# Patient Record
Sex: Female | Born: 1965 | Race: White | Hispanic: No | Marital: Married | Smoking: Never smoker
Health system: Southern US, Community
[De-identification: ages and names within clinical notes are randomized; demographics above are authoritative.]

## PROBLEM LIST (undated history)

## (undated) DIAGNOSIS — I4901 Ventricular fibrillation: Secondary | ICD-10-CM

## (undated) DIAGNOSIS — I4581 Long QT syndrome: Secondary | ICD-10-CM

## (undated) DIAGNOSIS — Z975 Presence of (intrauterine) contraceptive device: Secondary | ICD-10-CM

## (undated) DIAGNOSIS — I219 Acute myocardial infarction, unspecified: Secondary | ICD-10-CM

## (undated) DIAGNOSIS — Z95810 Presence of automatic (implantable) cardiac defibrillator: Secondary | ICD-10-CM

## (undated) DIAGNOSIS — Z950 Presence of cardiac pacemaker: Secondary | ICD-10-CM

## (undated) DIAGNOSIS — I4892 Unspecified atrial flutter: Secondary | ICD-10-CM

## (undated) DIAGNOSIS — R001 Bradycardia, unspecified: Secondary | ICD-10-CM

## (undated) HISTORY — PX: TYMPANOSTOMY TUBE PLACEMENT: SHX32

## (undated) HISTORY — DX: Presence of (intrauterine) contraceptive device: Z97.5

## (undated) HISTORY — PX: OTHER SURGICAL HISTORY: SHX169

## (undated) HISTORY — DX: Ventricular fibrillation: I49.01

## (undated) HISTORY — DX: Unspecified atrial flutter: I48.92

## (undated) HISTORY — PX: ADENOIDECTOMY: SUR15

## (undated) HISTORY — PX: IMPLANTABLE CARDIOVERTER DEFIBRILLATOR IMPLANT: SHX5860

## (undated) NOTE — Telephone Encounter (Signed)
 Formatting of this note might be different from the original. VIRTUAL CARE NOTE  Reason for message: Cardiology SBAR Other  Patient's request: returning call to [facility] regarding requested appointment with Dr Donnella.  Action:  Routed/pended for RN/LPN review. Called the 2 numbers on file but member unavailable to pick calls and no access to voice mail. Best Callback Number  [phone]  No future appointments.  Electronically signed by Lowry Lorenz Sober RN at 10/21/2023  8:16 AM EDT

## (undated) NOTE — Telephone Encounter (Signed)
 Formatting of this note might be different from the original. Copied from CRM #[id number]. Topic: MSG - Returning Telephone Call [phone]) >> Oct 20, 2023  4:21 PM Geannie GORMAN Kung wrote:  Recommendation: Please evaluate message and contact caller, if appropriate.  Notify caller when request is completed by Phone  Situation: Reason for call: Mackenze Grandison is a 40 female calling for Returning Telephone Call - member received message from  and is returning call. Member stated she is declining to be scheduled with cardio  Best Callback Number    The member authorizes [facility] employees to Respond by voice message  Background: Which department and/or provider would you like your request/message directed to?  cardio  No future appointments.  Action/Assessment:  Message sent to [facility]  Member expectations for follow up    Was documentation recapped and verified with caller?  Yes, Electronically signed by Lowry Lorenz Barnie RN at 10/21/2023  8:16 AM EDT

---

## 1997-08-04 HISTORY — PX: DILATION AND CURETTAGE OF UTERUS: SHX78

## 1998-06-05 ENCOUNTER — Other Ambulatory Visit: Admission: RE | Admit: 1998-06-05 | Discharge: 1998-06-05 | Payer: Self-pay | Admitting: Gynecology

## 1998-06-14 ENCOUNTER — Ambulatory Visit (HOSPITAL_COMMUNITY): Admission: RE | Admit: 1998-06-14 | Discharge: 1998-06-14 | Payer: Self-pay | Admitting: Gynecology

## 1999-01-06 ENCOUNTER — Encounter: Payer: Self-pay | Admitting: Gynecology

## 1999-01-06 ENCOUNTER — Inpatient Hospital Stay (HOSPITAL_COMMUNITY): Admission: AD | Admit: 1999-01-06 | Discharge: 1999-01-06 | Payer: Self-pay | Admitting: Gynecology

## 1999-01-15 ENCOUNTER — Other Ambulatory Visit: Admission: RE | Admit: 1999-01-15 | Discharge: 1999-01-15 | Payer: Self-pay | Admitting: Gynecology

## 1999-08-20 ENCOUNTER — Inpatient Hospital Stay (HOSPITAL_COMMUNITY): Admission: AD | Admit: 1999-08-20 | Discharge: 1999-08-23 | Payer: Self-pay | Admitting: Gynecology

## 1999-10-09 ENCOUNTER — Other Ambulatory Visit: Admission: RE | Admit: 1999-10-09 | Discharge: 1999-10-09 | Payer: Self-pay | Admitting: Gynecology

## 2000-12-07 ENCOUNTER — Other Ambulatory Visit: Admission: RE | Admit: 2000-12-07 | Discharge: 2000-12-07 | Payer: Self-pay | Admitting: Gynecology

## 2001-12-09 ENCOUNTER — Other Ambulatory Visit: Admission: RE | Admit: 2001-12-09 | Discharge: 2001-12-09 | Payer: Self-pay | Admitting: Gynecology

## 2003-10-04 ENCOUNTER — Other Ambulatory Visit: Admission: RE | Admit: 2003-10-04 | Discharge: 2003-10-04 | Payer: Self-pay | Admitting: Gynecology

## 2006-01-13 ENCOUNTER — Other Ambulatory Visit: Admission: RE | Admit: 2006-01-13 | Discharge: 2006-01-13 | Payer: Self-pay | Admitting: Gynecology

## 2006-12-12 ENCOUNTER — Emergency Department (HOSPITAL_COMMUNITY): Admission: EM | Admit: 2006-12-12 | Discharge: 2006-12-12 | Payer: Self-pay | Admitting: Emergency Medicine

## 2007-02-25 ENCOUNTER — Other Ambulatory Visit: Admission: RE | Admit: 2007-02-25 | Discharge: 2007-02-25 | Payer: Self-pay | Admitting: Gynecology

## 2008-01-03 ENCOUNTER — Inpatient Hospital Stay (HOSPITAL_COMMUNITY): Admission: EM | Admit: 2008-01-03 | Discharge: 2008-01-10 | Payer: Self-pay | Admitting: Emergency Medicine

## 2008-01-03 ENCOUNTER — Ambulatory Visit: Payer: Self-pay | Admitting: Pulmonary Disease

## 2008-01-03 ENCOUNTER — Ambulatory Visit: Payer: Self-pay | Admitting: Internal Medicine

## 2008-01-04 ENCOUNTER — Encounter (INDEPENDENT_AMBULATORY_CARE_PROVIDER_SITE_OTHER): Payer: Self-pay | Admitting: Cardiology

## 2008-01-24 ENCOUNTER — Ambulatory Visit: Payer: Self-pay

## 2008-02-17 ENCOUNTER — Encounter (HOSPITAL_COMMUNITY): Admission: RE | Admit: 2008-02-17 | Discharge: 2008-04-27 | Payer: Self-pay | Admitting: Cardiology

## 2008-04-13 ENCOUNTER — Ambulatory Visit: Payer: Self-pay | Admitting: Internal Medicine

## 2008-07-26 ENCOUNTER — Ambulatory Visit: Payer: Self-pay | Admitting: Internal Medicine

## 2008-08-21 ENCOUNTER — Ambulatory Visit: Payer: Self-pay | Admitting: Internal Medicine

## 2008-09-01 ENCOUNTER — Inpatient Hospital Stay (HOSPITAL_COMMUNITY): Admission: EM | Admit: 2008-09-01 | Discharge: 2008-09-04 | Payer: Self-pay | Admitting: Emergency Medicine

## 2008-09-01 ENCOUNTER — Ambulatory Visit: Payer: Self-pay | Admitting: Internal Medicine

## 2008-09-01 ENCOUNTER — Encounter: Payer: Self-pay | Admitting: Internal Medicine

## 2008-09-25 ENCOUNTER — Encounter: Payer: Self-pay | Admitting: Gynecology

## 2008-09-25 ENCOUNTER — Ambulatory Visit: Payer: Self-pay | Admitting: Gynecology

## 2008-09-25 ENCOUNTER — Other Ambulatory Visit: Admission: RE | Admit: 2008-09-25 | Discharge: 2008-09-25 | Payer: Self-pay | Admitting: Gynecology

## 2008-09-29 ENCOUNTER — Encounter: Payer: Self-pay | Admitting: Internal Medicine

## 2008-10-06 ENCOUNTER — Ambulatory Visit: Payer: Self-pay | Admitting: Internal Medicine

## 2008-11-30 ENCOUNTER — Encounter: Payer: Self-pay | Admitting: Internal Medicine

## 2008-12-05 ENCOUNTER — Ambulatory Visit: Payer: Self-pay | Admitting: Internal Medicine

## 2008-12-29 ENCOUNTER — Encounter: Payer: Self-pay | Admitting: Internal Medicine

## 2009-01-18 ENCOUNTER — Ambulatory Visit: Payer: Self-pay | Admitting: Internal Medicine

## 2009-01-18 DIAGNOSIS — I469 Cardiac arrest, cause unspecified: Secondary | ICD-10-CM | POA: Insufficient documentation

## 2009-01-18 DIAGNOSIS — I4581 Long QT syndrome: Secondary | ICD-10-CM | POA: Insufficient documentation

## 2009-01-22 LAB — CONVERTED CEMR LAB
Calcium: 8.6 mg/dL (ref 8.4–10.5)
Creatinine, Ser: 0.6 mg/dL (ref 0.4–1.2)
GFR calc non Af Amer: 115.82 mL/min (ref >60)
Sodium: 141 meq/L (ref 135–145)

## 2009-02-06 ENCOUNTER — Telehealth: Payer: Self-pay | Admitting: Internal Medicine

## 2009-03-02 ENCOUNTER — Ambulatory Visit: Payer: Self-pay | Admitting: Internal Medicine

## 2009-03-02 DIAGNOSIS — E876 Hypokalemia: Secondary | ICD-10-CM | POA: Insufficient documentation

## 2009-03-02 DIAGNOSIS — E162 Hypoglycemia, unspecified: Secondary | ICD-10-CM | POA: Insufficient documentation

## 2009-03-02 LAB — CONVERTED CEMR LAB
BUN: 10 mg/dL (ref 6–23)
CO2: 32 meq/L (ref 19–32)
Calcium: 9 mg/dL (ref 8.4–10.5)
Chloride: 109 meq/L (ref 96–112)
Creatinine, Ser: 0.7 mg/dL (ref 0.4–1.2)
GFR calc non Af Amer: 96.89 mL/min (ref >60)
Glucose, Bld: 70 mg/dL (ref 70–99)
Potassium: 4 meq/L (ref 3.5–5.1)
Sodium: 144 meq/L (ref 135–145)

## 2009-07-16 ENCOUNTER — Encounter: Payer: Self-pay | Admitting: Internal Medicine

## 2009-08-23 ENCOUNTER — Ambulatory Visit: Payer: Self-pay | Admitting: Internal Medicine

## 2009-08-23 DIAGNOSIS — Z95810 Presence of automatic (implantable) cardiac defibrillator: Secondary | ICD-10-CM | POA: Insufficient documentation

## 2009-08-23 DIAGNOSIS — I10 Essential (primary) hypertension: Secondary | ICD-10-CM | POA: Insufficient documentation

## 2009-10-28 ENCOUNTER — Inpatient Hospital Stay (HOSPITAL_COMMUNITY): Admission: EM | Admit: 2009-10-28 | Discharge: 2009-10-30 | Payer: Self-pay | Admitting: Emergency Medicine

## 2009-10-28 ENCOUNTER — Telehealth: Payer: Self-pay | Admitting: Adult Health

## 2009-10-28 ENCOUNTER — Ambulatory Visit: Payer: Self-pay | Admitting: Internal Medicine

## 2009-10-30 ENCOUNTER — Encounter: Payer: Self-pay | Admitting: Internal Medicine

## 2009-11-02 ENCOUNTER — Encounter (INDEPENDENT_AMBULATORY_CARE_PROVIDER_SITE_OTHER): Payer: Self-pay | Admitting: *Deleted

## 2009-11-07 ENCOUNTER — Ambulatory Visit: Payer: Self-pay

## 2009-11-07 ENCOUNTER — Ambulatory Visit: Payer: Self-pay | Admitting: Internal Medicine

## 2009-11-14 LAB — CONVERTED CEMR LAB
CO2: 29 meq/L (ref 19–32)
Calcium: 9.1 mg/dL (ref 8.4–10.5)
Chloride: 103 meq/L (ref 96–112)
Creatinine, Ser: 0.7 mg/dL (ref 0.4–1.2)
Sodium: 141 meq/L (ref 135–145)

## 2009-11-28 ENCOUNTER — Encounter: Payer: Self-pay | Admitting: Internal Medicine

## 2010-02-19 ENCOUNTER — Encounter: Payer: Self-pay | Admitting: Internal Medicine

## 2010-03-04 ENCOUNTER — Ambulatory Visit: Payer: Self-pay | Admitting: Internal Medicine

## 2010-03-11 ENCOUNTER — Encounter: Payer: Self-pay | Admitting: Internal Medicine

## 2010-05-20 ENCOUNTER — Telehealth: Payer: Self-pay | Admitting: Internal Medicine

## 2010-06-06 ENCOUNTER — Ambulatory Visit: Payer: Self-pay | Admitting: Internal Medicine

## 2010-07-04 ENCOUNTER — Encounter (INDEPENDENT_AMBULATORY_CARE_PROVIDER_SITE_OTHER): Payer: Self-pay | Admitting: *Deleted

## 2010-08-29 ENCOUNTER — Ambulatory Visit
Admission: RE | Admit: 2010-08-29 | Discharge: 2010-08-29 | Payer: Self-pay | Source: Home / Self Care | Attending: Internal Medicine | Admitting: Internal Medicine

## 2010-09-03 NOTE — Letter (Signed)
Summary: Device-Delinquent Phone Journalist, newspaper, Main Office  [address]   [place], [place] [address]   Phone: [phone]  Fax: [phone]     November 28, 2009 MRN: [id number]   [facility] [address] [place], [place]  [address]   Dear [name],  According to our records, you were scheduled for a device phone transmission on 11-20-2009.    We did not receive any results from this check.  If you transmitted on your scheduled day, please call us to help troubleshoot your system.  If you forgot to send your transmission, please send one upon receipt of this letter.  Thank you,   Architectural technologist [facility]

## 2010-09-03 NOTE — Assessment & Plan Note (Signed)
Summary: device/saf   Visit Type:  Follow-up Primary Provider:  Reynaldo Minium MD   History of Present Illness: [name] returns today for followup.  She is a pleasant 45 yo woman with a VF arrest, thought most likely to be due to long QT syndrome (QT was almost 700 with T wave alternans after her arrest) who had an ICD placed.  She returns today for followup.  The patient denies c/p, sob, or any intercurrent ICD therapies.  She has been maintained on metoprolol and after recurrent VF, Mexilitine.  She notes that she is scheduled to travel to the [place] in one month.  Current Medications (verified): 1)  Potassium Chloride Crys Cr 20 Meq Cr-Tabs (Potassium Chloride Crys Cr) .... Take One Tablet By Mouth Twice A Day 2)  Mexiletine Hcl 150 Mg Caps (Mexiletine Hcl) .... Take One Capsule By Mouth Once Daily 3)  Metoprolol Tartrate 50 Mg Tabs (Metoprolol Tartrate) .... Take 1/2 By Mouth Once Daily 4)  Nifedipine 10 Mg Caps (Nifedipine) .... Take 1 Capsule By Mouth Once A Day  Allergies (verified): No Known Drug Allergies  Past History:  Past Medical History: Last updated: 06/22/2008 Long QT syndrome VF arrest with rescucitation 01/03/08 VDRF secondary to VF arrest  Past Surgical History: Last updated: 06/22/2008 ICD implant June 2009 (Medtronic Maximo 2 VR)  Review of Systems  The patient denies chest pain, syncope, dyspnea on exertion, and peripheral edema.    Vital Signs:  Patient profile:   45 year old female Height:      64 inches Weight:      142 pounds BMI:     24.46 Pulse rate:   56 / minute BP sitting:   132 / 70  Vitals Entered By: Laurance Flatten CMA (August 23, 2009 4:40 PM)  Physical Exam  General:  Well developed, well nourished, in no acute distress. Head:  normocephalic and atraumatic Eyes:  PERRLA/EOM intact; conjunctiva and lids normal. Mouth:  Teeth, gums and palate normal. Oral mucosa normal. Neck:  Neck supple, no JVD. No masses, thyromegaly or  abnormal cervical nodes. Chest Wall:  Well healed ICD incision. Lungs:  Clear bilaterally to auscultation. No wheezes, rales, or rhonchi. Heart:  RRR with normal S1 and S2.  PMI is not enlarged or laterally displaced. Abdomen:  Bowel sounds positive; abdomen soft and non-tender without masses, organomegaly, or hernias noted. No hepatosplenomegaly. Msk:  Back normal, normal gait. Muscle strength and tone normal. Pulses:  pulses normal in all 4 extremities Extremities:  No clubbing or cyanosis. Neurologic:  Alert and oriented x 3.    ICD Specifications Following MD:  Tracy Bunting, MD     ICD Vendor:  Medtronic     ICD Model Number:  [id number]     ICD Serial Number:  [id number] H ICD DOI:  01/07/2008      Lead 1:    Location: RV     DOI: 01/07/2008     Model #: 7120     Serial #: [id number]     Status: active  Indications::  Cardiac Arrest   ICD Follow Up Remote Check?  No Battery Voltage:  3.14 V     Charge Time:  8.4 seconds     Underlying rhythm:  SR WITH PVC'S ICD Dependent:  No       ICD Device Measurements Right Ventricle:  Amplitude: 4 mV, Impedance: 532 ohms, Threshold: 1.0 V at 0.4 msec Shock Impedance: 42/56 ohms   Episodes Shock:  0  ATP:  0     Nonsustained:  0     Ventricular Pacing:  26%  Brady Parameters Mode VVI     Lower Rate Limit:  40      Tachy Zones VF:  200     Next Remote Date:  11/20/2009     Next Cardiology Appt Due:  08/05/2010 Tech Comments:  Normal device function.  No changes made today.  Pt does Carelink transmissions.  ROV 12 months GT. Gypsy Balsam RN BSN  August 23, 2009 4:44 PM  MD Comments:  Agree with above.  Impression & Recommendations:  Problem # 1:  AUTOMATIC IMPLANTABLE CARDIAC DEFIBRILLATOR SITU (ICD-V45.02) Her device is working normally.  She has had no intercurrent ICD therapies.  Will followup in several months.  Problem # 2:  LONG QT SYNDROME (ICD-426.82) She has been arrhythmia free on her meds as below. The following  medications were removed from the medication list:    Procardia 10 Mg Caps (Nifedipine) .Marland Kitchen... 1 by mouth once daily Her updated medication list for this problem includes:    Mexiletine Hcl 150 Mg Caps (Mexiletine hcl) .Marland Kitchen... Take one capsule by mouth once daily    Metoprolol Tartrate 50 Mg Tabs (Metoprolol tartrate) .Marland Kitchen... Take 1/2 by mouth once daily    Nifedipine 10 Mg Caps (Nifedipine) .Marland Kitchen... Take 1 capsule by mouth once a day  Problem # 3:  ESSENTIAL HYPERTENSION, BENIGN (ICD-401.1) Her blood pressure is minimally elevated today but is normally controlled.  She asked me about taking nifedipine.  I did not start this medication but will follow up and consider adjusting to perhaps an ARB or ACE inhibitor over time. The following medications were removed from the medication list:    Procardia 10 Mg Caps (Nifedipine) .Marland Kitchen... 1 by mouth once daily Her updated medication list for this problem includes:    Metoprolol Tartrate 50 Mg Tabs (Metoprolol tartrate) .Marland Kitchen... Take 1/2 by mouth once daily    Nifedipine 10 Mg Caps (Nifedipine) .Marland Kitchen... Take 1 capsule by mouth once a day  Patient Instructions: 1)   You are scheduled for a device check from home on November 20, 2009. You may send your transmission at any time that day. If you have a wireless device, the transmission will be sent automatically. After your physician reviews your transmission, you will receive a postcard with your next transmission date. 2)  Your physician recommends that you schedule a follow-up appointment with Dr Ladona Ridgel in 12 months

## 2010-09-03 NOTE — Letter (Signed)
Summary: Remote Device Check  [employer], Main Office  [address]   [place], [place] [address]   Phone: [phone]  Fax: [phone]     July 04, 2010 MRN: [id number]   [facility] [address] [place], [place]  [address]   Dear Ms. Westergaard,   Your remote transmission was recieved and reviewed by your physician.  All diagnostics were within normal limits for you.  _____Your next transmission is scheduled for:                       .  Please transmit at any time this day.  If you have a wireless device your transmission will be sent automatically.  ___X___Your next office visit is scheduled for: January 2012 with Dr. Ladona Ridgel.                             . Please call our office to schedule an appointment.    Sincerely,  Altha Harm, LPN

## 2010-09-03 NOTE — Progress Notes (Signed)
Summary: refill  Phone Note Refill Request Message from:  Patient on May 20, 2010 2:41 PM  Refills Requested: Medication #1:  POTASSIUM CHLORIDE CRYS CR 20 MEQ CR-TABS Take one tablet by mouth twice a day [facility] [phone]  Initial call taken by: Judie Grieve,  May 20, 2010 2:42 PM  Follow-up for Phone Call        Sent with correct dose and directions. Follow-up by: Laurance Flatten CMA,  May 22, 2010 4:16 PM

## 2010-09-03 NOTE — Letter (Signed)
Summary: Remote Device Check  [employer], Main Office  [address]   [place], [place] [address]   Phone: [phone]  Fax: [phone]     March 11, 2010 MRN: [id number]   [facility] [address] [place], [place]  [address]   Dear Ms. Amesquita,   Your remote transmission was recieved and reviewed by your physician.  All diagnostics were within normal limits for you.  __X___Your next transmission is scheduled for:  06-06-2010.  Please transmit at any time this day.  If you have a wireless device your transmission will be sent automatically.    Sincerely,  Vella Kohler

## 2010-09-03 NOTE — Letter (Signed)
Summary: Device-Delinquent Phone Journalist, newspaper, Main Office  [address]   [place], [place] [address]   Phone: [phone]  Fax: [phone]     February 19, 2010 MRN: [id number]   [facility] [address] [place], [place]  [address]   Dear Ms. Parkison,  According to our records, you were scheduled for a device phone transmission on 11-20-2009.     We did not receive any results from this check.  If you transmitted on your scheduled day, please call us to help troubleshoot your system.  If you forgot to send your transmission, please send one upon receipt of this letter.  Thank you,   Architectural technologist Device Clinic

## 2010-09-03 NOTE — Progress Notes (Signed)
  Phone Note Call from Patient Call back at [facility] Phone [phone]   Summary of Call: [name] called mildy upset because she thinks that her ICD fired today.  She says that she never felt it discharge,but did have a syncopal episode.  She says she felt bad, flushed and then decided to lay down.  She thinks she passed out.  She is requesting that her pacemaker be checked again to ascertain if it shocked her.  She states the last time her ICD fired, she did not feel it, but instead had a syncopal episode.  I have advised her to come by the office in the am 10/29/2009 to have it interrogated.  If, she experiences another syncopal episode she is to come to ER via EMS. She verbalizes understanding.  She may have fallen asleep as well and thought she passed out but she is not suret.  I have left a message with the [employer] office to expect her. Initial call taken by: Joni Reining NP

## 2010-09-03 NOTE — Cardiovascular Report (Signed)
Summary: Office Visit Remote   Office Visit Remote   Imported By: Roderic Ovens 03/11/2010 14:49:31  _____________________________________________________________________  External Attachment:    Type:   Image     Comment:   External Document

## 2010-09-03 NOTE — Miscellaneous (Signed)
Summary: [facility] s/p hospital  Clinical Lists Changes  Orders: Added new Referral order of Treadmill (Treadmill) - Signed

## 2010-09-05 NOTE — Assessment & Plan Note (Signed)
Summary: [email or web address]   Visit Type:  Follow-up Primary Provider:  Reynaldo Minium MD   History of Present Illness: [name] returns today for followup.  She is a pleasant 45 yo woman with a VF arrest, thought most likely to be due to long QT syndrome (QT was almost 700 with T wave alternans after her arrest) who had an ICD placed.  She returns today for followup.  The patient denies c/p, sob, or any intercurrent ICD therapies.  She has been maintained on metoprolol and after recurrent VF, Mexilitine.  She notes that she has begun to exercise regularly and has lost 20 lbs. She is tolerating her meds well and had no specific complaints.  Current Medications (verified): 1)  Potassium Chloride Crys Cr 20 Meq Cr-Tabs (Potassium Chloride Crys Cr) .... Take 2 Tablets By Mouth Twice A Day 2)  Mexiletine Hcl 150 Mg Caps (Mexiletine Hcl) .... Take One Capsule By Mouth Two Times A Day 3)  Metoprolol Tartrate 50 Mg Tabs (Metoprolol Tartrate) .... Take 1/2 By Mouth Once Daily  Allergies (verified): No Known Drug Allergies  Past History:  Past Medical History: Last updated: 06/22/2008 Long QT syndrome VF arrest with rescucitation 01/03/08 VDRF secondary to VF arrest  Past Surgical History: Last updated: 06/22/2008 ICD implant June 2009 (Medtronic Maximo 2 VR)  Review of Systems  The patient denies chest pain, syncope, dyspnea on exertion, and peripheral edema.    Vital Signs:  Patient profile:   45 year old female Height:      64 inches Weight:      123 pounds BMI:     21.19 Pulse rate:   53 / minute BP sitting:   110 / 70  (left arm)  Vitals Entered By: Laurance Flatten CMA (August 29, 2010 10:21 AM)  Physical Exam  General:  Well developed, well nourished, in no acute distress. Head:  normocephalic and atraumatic Eyes:  PERRLA/EOM intact; conjunctiva and lids normal. Mouth:  Teeth, gums and palate normal. Oral mucosa normal. Neck:  Neck supple, no JVD. No masses,  thyromegaly or abnormal cervical nodes. Chest Wall:  Well healed ICD incision. Lungs:  Clear bilaterally to auscultation. No wheezes, rales, or rhonchi. Heart:  RRR with normal S1 and S2.  PMI is not enlarged or laterally displaced. Abdomen:  Bowel sounds positive; abdomen soft and non-tender without masses, organomegaly, or hernias noted. No hepatosplenomegaly. Msk:  Back normal, normal gait. Muscle strength and tone normal. Pulses:  pulses normal in all 4 extremities Extremities:  No clubbing or cyanosis. Neurologic:  Alert and oriented x 3.    ICD Specifications Following MD:  Lewayne Bunting, MD     ICD Vendor:  Medtronic     ICD Model Number:  [id number]     ICD Serial Number:  [id number] H ICD DOI:  01/07/2008      Lead 1:    Location: RV     DOI: 01/07/2008     Model #: 7120     Serial #: [id number]     Status: active  Indications::  Cardiac Arrest   ICD Follow Up Battery Voltage:  3.07 V     Charge Time:  9.1 seconds     Underlying rhythm:  SR ICD Dependent:  No       ICD Device Measurements Right Ventricle:  Amplitude: 2.0 mV, Impedance: 475 ohms, Threshold: 1.00 V at 0.40 msec Shock Impedance: 42/55 ohms   Episodes MS Episodes:  0     Shock:  0  ATP:  0     Nonsustained:  0     Atrial Therapies:  0 Ventricular Pacing:  3.7%  Brady Parameters Mode VVI     Lower Rate Limit:  40      Tachy Zones VF:  200     Next Remote Date:  11/28/2010     Next Cardiology Appt Due:  08/05/2011 Tech Comments:  NORMAL DEVICE FUNCTION.  NO EPISODES SINCE LAST CHECK.  NO CHANGES MADE.  CARELINK 11-28-10 AND ROV IN 12 MTHS W/GT. Vella Kohler  August 29, 2010 10:24 AM MD Comments:  Agree with above.  Impression & Recommendations:  Problem # 1:  AUTOMATIC IMPLANTABLE CARDIAC DEFIBRILLATOR SITU (ICD-V45.02) Her device is working normaly. Will continue current meds.  Problem # 2:  LONG QT SYNDROME (ICD-426.82) She has had no recurrent ventricular arrhythmias since last visit. She will  continue her current meds. The following medications were removed from the medication list:    Nifedipine 10 Mg Caps (Nifedipine) .Marland Kitchen... Take 1 capsule by mouth once a day Her updated medication list for this problem includes:    Mexiletine Hcl 150 Mg Caps (Mexiletine hcl) .Marland Kitchen... Take one capsule by mouth two times a day    Metoprolol Tartrate 50 Mg Tabs (Metoprolol tartrate) .Marland Kitchen... Take 1/2 by mouth once daily  Problem # 3:  ESSENTIAL HYPERTENSION, BENIGN (ICD-401.1) Her blood pressure is under much better control. Will follow. The following medications were removed from the medication list:    Nifedipine 10 Mg Caps (Nifedipine) .Marland Kitchen... Take 1 capsule by mouth once a day Her updated medication list for this problem includes:    Metoprolol Tartrate 50 Mg Tabs (Metoprolol tartrate) .Marland Kitchen... Take 1/2 by mouth once daily

## 2010-10-27 LAB — BASIC METABOLIC PANEL
CO2: 25 mEq/L (ref 19–32)
Calcium: 8.8 mg/dL (ref 8.4–10.5)
Chloride: 109 mEq/L (ref 96–112)
Creatinine, Ser: 0.68 mg/dL (ref 0.4–1.2)
Creatinine, Ser: 0.73 mg/dL (ref 0.4–1.2)
GFR calc Af Amer: >60 mL/min (ref >60)
GFR calc Af Amer: >60 mL/min (ref >60)
GFR calc non Af Amer: >60 mL/min (ref >60)
Glucose, Bld: 105 mg/dL — ABNORMAL HIGH (ref 70–99)
Potassium: 3.4 mEq/L — ABNORMAL LOW (ref 3.5–5.1)
Sodium: 140 mEq/L (ref 135–145)
Sodium: 142 mEq/L (ref 135–145)

## 2010-10-27 LAB — PHOSPHORUS: Phosphorus: 2.2 mg/dL — ABNORMAL LOW (ref 2.3–4.6)

## 2010-10-27 LAB — CBC
HCT: 42.9 % (ref 36.0–46.0)
Hemoglobin: 12.9 g/dL (ref 12.0–15.0)
Hemoglobin: 14.2 g/dL (ref 12.0–15.0)
Hemoglobin: 14.6 g/dL (ref 12.0–15.0)
MCHC: 34.3 g/dL (ref 30.0–36.0)
MCV: 97.1 fL (ref 78.0–100.0)
MCV: 97.6 fL (ref 78.0–100.0)
RBC: 3.91 MIL/uL (ref 3.87–5.11)
RBC: 4.26 MIL/uL (ref 3.87–5.11)
RBC: 4.39 MIL/uL (ref 3.87–5.11)
WBC: 6.1 10*3/uL (ref 4.0–10.5)

## 2010-10-27 LAB — SEDIMENTATION RATE: Sed Rate: 5 mm/hr (ref 0–22)

## 2010-10-27 LAB — PROTIME-INR: INR: 0.99 (ref 0.00–1.49)

## 2010-10-27 LAB — TSH: TSH: 0.927 u[IU]/mL (ref 0.350–4.500)

## 2010-10-27 LAB — POCT I-STAT, CHEM 8
Creatinine, Ser: 0.8 mg/dL (ref 0.4–1.2)
HCT: 45 % (ref 36.0–46.0)
Hemoglobin: 15.3 g/dL — ABNORMAL HIGH (ref 12.0–15.0)
Potassium: 3.4 mEq/L — ABNORMAL LOW (ref 3.5–5.1)
Sodium: 142 mEq/L (ref 135–145)
TCO2: 25 mmol/L (ref 0–100)

## 2010-10-27 LAB — URINALYSIS, ROUTINE W REFLEX MICROSCOPIC
Glucose, UA: NEGATIVE mg/dL
Ketones, ur: NEGATIVE mg/dL
Leukocytes, UA: NEGATIVE
Protein, ur: NEGATIVE mg/dL
Urobilinogen, UA: 0.2 mg/dL (ref 0.0–1.0)

## 2010-10-27 LAB — DIFFERENTIAL
Basophils Absolute: 0 10*3/uL (ref 0.0–0.1)
Basophils Relative: 1 % (ref 0–1)
Eosinophils Absolute: 0.1 10*3/uL (ref 0.0–0.7)
Eosinophils Absolute: 0.1 10*3/uL (ref 0.0–0.7)
Eosinophils Relative: 1 % (ref 0–5)
Lymphocytes Relative: 33 % (ref 12–46)
Lymphs Abs: 2.3 10*3/uL (ref 0.7–4.0)
Lymphs Abs: 2.6 10*3/uL (ref 0.7–4.0)
Monocytes Absolute: 0.5 10*3/uL (ref 0.1–1.0)
Monocytes Absolute: 0.6 10*3/uL (ref 0.1–1.0)
Monocytes Relative: 8 % (ref 3–12)
Monocytes Relative: 9 % (ref 3–12)
Neutro Abs: 4.6 10*3/uL (ref 1.7–7.7)
Neutrophils Relative %: 53 % (ref 43–77)
Neutrophils Relative %: 59 % (ref 43–77)

## 2010-10-27 LAB — CK TOTAL AND CKMB (NOT AT ARMC)
CK, MB: 1.6 ng/mL (ref 0.3–4.0)
Relative Index: 1.4 (ref 0.0–2.5)

## 2010-10-27 LAB — POCT CARDIAC MARKERS
CKMB, poc: <1 ng/mL — ABNORMAL LOW (ref 1.0–8.0)
Myoglobin, poc: 39.5 ng/mL (ref 12–200)
Myoglobin, poc: 44.2 ng/mL (ref 12–200)

## 2010-10-27 LAB — TROPONIN I: Troponin I: 0.02 ng/mL (ref 0.00–0.06)

## 2010-10-27 LAB — MAGNESIUM
Magnesium: 2.1 mg/dL (ref 1.5–2.5)
Magnesium: 2.3 mg/dL (ref 1.5–2.5)

## 2010-10-27 LAB — CARDIAC PANEL(CRET KIN+CKTOT+MB+TROPI)
Relative Index: INVALID (ref 0.0–2.5)
Relative Index: INVALID (ref 0.0–2.5)
Total CK: 66 U/L (ref 7–177)

## 2010-10-27 LAB — LIPID PANEL: VLDL: 27 mg/dL (ref 0–40)

## 2010-10-27 LAB — BRAIN NATRIURETIC PEPTIDE: Pro B Natriuretic peptide (BNP): 266 pg/mL — ABNORMAL HIGH (ref 0.0–100.0)

## 2010-10-27 LAB — POCT PREGNANCY, URINE: Preg Test, Ur: NEGATIVE

## 2010-11-07 ENCOUNTER — Other Ambulatory Visit: Payer: Self-pay | Admitting: Adult Health

## 2010-11-08 ENCOUNTER — Other Ambulatory Visit: Payer: Self-pay | Admitting: *Deleted

## 2010-11-08 MED ORDER — MEXILETINE HCL 150 MG PO CAPS: 150.0000 mg | ORAL_CAPSULE | Freq: Two times a day (BID) | ORAL | Status: DC

## 2010-11-12 NOTE — Telephone Encounter (Signed)
**Note De-Identified [name] Obfuscation** [facility] pt.

## 2010-11-18 LAB — CK TOTAL AND CKMB (NOT AT ARMC)
CK, MB: 1.4 ng/mL (ref 0.3–4.0)
Total CK: 96 U/L (ref 7–177)

## 2010-11-18 LAB — BASIC METABOLIC PANEL
BUN: 11 mg/dL (ref 6–23)
CO2: 21 mEq/L (ref 19–32)
CO2: 22 mEq/L (ref 19–32)
Calcium: 9.3 mg/dL (ref 8.4–10.5)
Chloride: 106 mEq/L (ref 96–112)
Chloride: 109 mEq/L (ref 96–112)
Chloride: 110 mEq/L (ref 96–112)
Creatinine, Ser: 0.71 mg/dL (ref 0.4–1.2)
Creatinine, Ser: 0.8 mg/dL (ref 0.4–1.2)
Creatinine, Ser: 0.8 mg/dL (ref 0.4–1.2)
GFR calc Af Amer: >60 mL/min (ref >60)
GFR calc Af Amer: >60 mL/min (ref >60)
GFR calc Af Amer: >60 mL/min (ref >60)
GFR calc non Af Amer: >60 mL/min (ref >60)
Glucose, Bld: 83 mg/dL (ref 70–99)
Potassium: 4.1 mEq/L (ref 3.5–5.1)
Sodium: 140 mEq/L (ref 135–145)

## 2010-11-18 LAB — CBC
Hemoglobin: 15 g/dL (ref 12.0–15.0)
MCHC: 32.7 g/dL (ref 30.0–36.0)
MCV: 92.8 fL (ref 78.0–100.0)
RBC: 4.94 MIL/uL (ref 3.87–5.11)

## 2010-11-18 LAB — POCT CARDIAC MARKERS
CKMB, poc: <1 ng/mL — ABNORMAL LOW (ref 1.0–8.0)
Myoglobin, poc: 88.2 ng/mL (ref 12–200)

## 2010-11-18 LAB — TROPONIN I
Troponin I: 0.02 ng/mL (ref 0.00–0.06)
Troponin I: 0.03 ng/mL (ref 0.00–0.06)

## 2010-11-18 LAB — TSH: TSH: 1.417 u[IU]/mL (ref 0.350–4.500)

## 2010-11-18 LAB — MAGNESIUM: Magnesium: 2.3 mg/dL (ref 1.5–2.5)

## 2010-11-28 ENCOUNTER — Encounter: Payer: Self-pay | Admitting: *Deleted

## 2010-12-01 ENCOUNTER — Encounter: Payer: Self-pay | Admitting: *Deleted

## 2010-12-16 ENCOUNTER — Other Ambulatory Visit: Payer: Self-pay | Admitting: Internal Medicine

## 2010-12-17 NOTE — Discharge Summary (Signed)
[name], [name]             ACCOUNT NO.:  [id number]   MEDICAL RECORD NO.:  [id number]          PATIENT TYPE:  INP   LOCATION:  3738                         FACILITY:  [facility]   PHYSICIAN:  Doylene Canning. Ladona Ridgel, MD    DATE OF BIRTH:  [birth year]   DATE OF ADMISSION:  09/01/2008  DATE OF DISCHARGE:  09/04/2008                               DISCHARGE SUMMARY   ADDENDUM    Ejection fraction of 50%-55%.  No left ventricular regional wall motion  abnormalities.  No significant mitral regurgitation.  Tricuspid  tricuspid valve was grossly normal with no significant regurgitation.  This is the contrast with the echocardiogram taken on January 29, which  showed severely depressed ejection fraction and which requires followup  echocardiogram.      Maple Mirza, PA      Doylene Canning. Ladona Ridgel, MD  Electronically Signed    GM/MEDQ  D:  09/04/2008  T:  09/05/2008  Job:  [id number]   cc:   Madaline Savage, M.D.  Doylene Canning. Ladona Ridgel, MD

## 2010-12-17 NOTE — H&P (Signed)
[name], [name]             ACCOUNT NO.:  [id number]   MEDICAL RECORD NO.:  [id number]          PATIENT TYPE:  INP   LOCATION:  2907                         FACILITY:  [facility]   PHYSICIAN:  Coralyn Helling, MD        DATE OF BIRTH:  [birth year]   DATE OF ADMISSION:  01/03/2008  DATE OF DISCHARGE:                              HISTORY & PHYSICAL   ADMISSION DIAGNOSIS:  Cardiac arrest.   HISTORY:  [name] is a 45 year old female who was in her usual state  of health until early this morning.  Her husband states that at 6:15  their alarm clock went off and she had gotten up briefly, but then came  back in the bed and complained of having a gasping feeling with her  breath.  She had progressive difficulty with breathing and then her  husband noted that she started turning blue and became unresponsive.  He  immediately started CPR and had daughter call EMS.  Upon arrival by EMS,  she was found to be in ventricular fibrillation and she received  defibrillation which returned to a feasible rhythm.  She was started on  an epinephrine infusion as well, as well as being intubated.  Upon  arrival in the emergency room, her initial EKG showed a right bundle  branch block, but no acute ST-T wave changes.  Dr. [name] from  Cardiology was at the bedside evaluating her as well.  Initial view on  bedside echo showed decreased contractility of the left ventral with  enlarged right ventricle, but no obvious pericardial effusions.  Of note  is that the patient apparently had a similar episode about 8 months ago  and was apparently evaluated by Neurology for possible seizure disorder,  but according to her husband this evaluation was unremarkable.   PAST MEDICAL HISTORY:  Question of a seizure disorder.   MEDICATIONS:  None.   ALLERGIES:  None.   SOCIAL HISTORY:  She works as a Runner, broadcasting/film/video.  She is married.  There is no  history of alcohol, tobacco, or substance abuse.   FAMILY HISTORY:   Significant for heart disease and alcohol abuse.   REVIEW OF SYSTEMS:  Unable to obtain.   PHYSICAL EXAM:  GENERAL:  She is seen in the emergency room.  VITAL SIGNS:  Temperature is 98, blood pressure is 97/60 on the 10 mcg  on Levophed, heart rate is 62, respiratory rate is 20, and oxygen  saturation 99%.  HEENT:  Pupils are 3 mm bilaterally.  She has an endotracheal tube in  place.  There is no lymphadenopathy.  No jugular venous distention.  No  thyromegaly.  HEART:  There is decreased heart sounds, but there is no obvious  murmurs.  CHEST:  She has a coarse breath sounds bilaterally, but good air entry  bilaterally.  There is no wheezing.  ABDOMEN:  Thin, soft, absent bowel sounds.  GU:  No obvious lesions.  EXTREMITIES:  Warm to the touch.  Peripheral pulses are palpable.  There  is no edema, cyanosis, or clubbing.  NEUROLOGIC:  She is sedated and paralyzed.  IMAGING:  Chest x-ray shows endotracheal tube in good position with a  left internal jugular venous catheter placed in good position.  She had  decreased lung volumes, coarse lung markings, and kyphoscoliosis.  Other  laboratory tests are pending.   IMPRESSION:  Ventricular fibrillation cardiac arrest.  At this point we  are in the process of evaluating the cause of this.  She could have a  primary electrophysiologic abnormality versus underlying coronary artery  disease.  She is being evaluated by Cardiology.  She does have a  questionable history of a seizure disorder and further neurologic  evaluation may be needed as well.  In the meantime, also get a CT of her  head to exclude any underlying structural brain lesions.  If this is  unremarkable then I will start her on intravenous heparin.  She has been  started on the hypothermia protocol and also check her cardiac enzymes  and follow up on her EKG.  She will also need a more formal 2D  echocardiogram done.  She will likely need to have cardiac  catheterization  done at some point when she is more stable.  I will also  check urine drug screen on her, although there is no obvious signs of  substance abuse.  I will also check her thyroid functions.  Further  interventions will be determined after review of her laboratory results.      Coralyn Helling, MD  Electronically Signed     VS/MEDQ  D:  01/03/2008  T:  01/03/2008  Job:  [id number]

## 2010-12-17 NOTE — Consult Note (Signed)
[name], [name]             ACCOUNT NO.:  [id number]   MEDICAL RECORD NO.:  [id number]          PATIENT TYPE:  INP   LOCATION:  2907                         FACILITY:  [facility]   PHYSICIAN:  Doylene Canning. Ladona Ridgel, MD    DATE OF BIRTH:  1965/11/26   DATE OF CONSULTATION:  01/04/2008  DATE OF DISCHARGE:                                 CONSULTATION   CONSULTATION IS REQUESTED BY:  Madaline Savage, MD   INDICATION FOR CONSULTATION:  Evaluation of the patient with ventricular  fibrillation arrest.   HISTORY OF PRESENT ILLNESS:  The patient is a very pleasant 45 year old  woman, who had a history of syncope approximately 8 months ago.  She was  seen by neurologist and thought she might have a seizure disorder,  though there is no conclusive diagnosis made.  The patient was in her  usual state of health until 6:15 on January 03, 2008, when her alarm clock  went off, she got up briefly, but then came back to bed, complained of  feeling gas like she was gasping for breath.  She subsequently became  unresponsive.  CPR was begun and EMS was called.  On arrival, the  paramedics found her to be in ventricular fibrillation and she was  successfully defibrillated and started on epinephrine.  The patient was  admitted to the hospital.  Her initial cardiac enzymes were elevated,  but her EKG demonstrated no evidence of any acute ST-T wave changes.  The patient is referred for additional evaluation.  In the emergency  department, her potassium was 3.   PAST MEDICAL HISTORY:  Really unremarkable.   SOCIAL HISTORY:  The patient works as a Runner, broadcasting/film/video.  She is married.  She  has no history of tobacco or alcohol abuse.   FAMILY HISTORY:  Notable for heart disease in the past.   REVIEW OF SYSTEMS:  Could not be obtained, in general, she was in good  health according to her family, except for the problem with seizure.   PHYSICAL EXAMINATION:  GENERAL:  The patient is intubated and sedated.  Her blood  pressure was 91/44.  Her pulse today was 69 and regular, the  respirations were 18, and her oxygen saturation was 100%.  HEENT:  Normocephalic and atraumatic.  Pupils equal and round.  Oropharynx is moist.  Sclerae anicteric.  NECK:  Revealed no jugular distention and no thyromegaly.  Trachea is  midline.  Carotid is 2+ and symmetric.  LUNGS:  Clear bilaterally to auscultation.  No wheezes, rales, or  rhonchi are present.  There is no increased work of breathing.  She did  have an endotracheal tube in place.  CARDIAC:  Regular rate and rhythm.  Normal S1 and S2.  There are no  murmurs, rubs, or gallops.  ABDOMEN:  Soft, nontender, and nondistended.  There is no organomegaly.  Bowel sound is present.  No rebound or guarding.  EXTREMITIES:  Demonstrated no cyanosis, clubbing, or edema.  The pulses  are 2+ and symmetric.  NEUROLOGIC:  The patient is sedated, but she does respond to simple  commands.   Her  EKG demonstrates sinus rhythm with initial QT of 740 msec.  It was  also notable, there is a clear-cut QRS alternate on the EKG.  Question,  QT alternates on the EKG.   IMPRESSION:  1. A ventricular fibrillation arrest, secondary to long QT syndrome.  2. Ventilatory-dependent respiratory failure, secondary to ventricular      fibrillation arrest.  3. Hypokalemia.   RECOMMENDATIONS:  At this point, to support the patient and try to  extubate her.  We would recommend proceeding with catheterization, while  I think the likelihood of obstructive coronary disease in this patient  is quite low.  She will ultimately need ICD implantation with dual-  chamber ICD for atrial over pacing, and would recommend aggressive  replacement of her potassium and magnesium.  If she has recurrent  episodes of VF, then she will require temporary transvenous pacemaker  plus/minus isoproterenol.      Doylene Canning. Ladona Ridgel, MD  Electronically Signed     GWT/MEDQ  D:  01/05/2008  T:  01/05/2008  Job:   [id number]

## 2010-12-17 NOTE — Op Note (Signed)
NAMEJESSECA, [name]             ACCOUNT NO.:  [id number]   MEDICAL RECORD NO.:  [id number]          PATIENT TYPE:  INP   LOCATION:  2928                         FACILITY:  [facility]   PHYSICIAN:  Doylene Canning. Ladona Ridgel, MD    DATE OF BIRTH:  1966/01/15   DATE OF PROCEDURE:  01/07/2008  DATE OF DISCHARGE:                               OPERATIVE REPORT   PROCEDURE PERFORMED:  Implantation of a single chamber defibrillator.   INDICATION:  Long QT syndrome with resuscitated VF arrest.   HISTORY:  The patient is a 45 year old woman who had a sudden episode of  syncope, was unresponsive.  Paramedics were called and she was in VF.  She was defibrillated and brought in.  She underwent a cooling  treatment.  She had recurrent VF in the hospital in association with  hypokalemia.  The patient's QT was 715 msec.  She is now referred for  ICD implantation.   PROCEDURE:  After informed consent was obtained, the patient taken to  the diagnostic EP lab in a fasting state.  After usual preparation and  draping, intravenous fentanyl and midazolam was given for sedation.  A  30 mL of lidocaine was infiltrated in the left infraclavicular region.  A 6-cm incision was carried out of this region.  Electrocautery was  utilized to dissect down to the fascial plane.  The left subclavian vein  was subsequently punctured and the initial Medtronic 9-French active  fixation defibrillation lead was advanced into the right ventricle.  Mapping was carried out in the right ventricle.  At least 100 locations  were mapped in the right ventricle and the R-waves were all between 4  and 6 mV.  Finally, a position was found in the RV apex.  In this  location through the analyzer, the R-waves measured 9 and after the  device was hooked up, they measured 6.  A subcutaneous pocket was then  made with electrocautery.  Kanamycin irrigation was utilized to irrigate  the pocket and electrocautery was utilized to assure hemostasis.   The  Medtronic Maximo II VR single chamber defibrillator was utilized for the  ICD.   The generator secured with silk suture.  The patient subsequently was  deeply sedated for defibrillation threshold testing.   After the patient was deeply sedated on fentanyl and Versed, VF was  induced with T-wave shock.  A 15-joules shock was delivered, which  failed to terminate VF.  A second 25-joules shock was delivered, also  failing to terminate VF.  A 200-joules biphasic external rescue shock  was then delivered restoring sinus rhythm, 5 minutes it was allowed to  elapse and the polarity was reversed and another DFT test carried out at  25 joules.  Unfortunately, VF was again induced and 25-joules failed to  terminate the patient's VF.  At this point, a decision had to be made  about what to do next.  Because at other locations throughout the right  ventricle, the R-waves were very small.  It was deemed not appropriate  to move the present lead to a new location.  For this reason, the  single  coiled defibrillation lead was removed and the [employer] [place], model  [id number] biphasic defibrillation lead serial # [id number] was advanced by way  of the left subclavian vein into the right ventricle.  The old Medtronic  single coil lead was removed.  At this point, the mapping was again  carried out in this lead on the RV septum.  The R-waves measured 8 mV  and the pacing impedance was 547 ohms and threshold 0.6 volts at 0.5  msec.  A nice injury current was noted and 10 volts pacing did not  stimulate the diaphragm.  The lead was hooked up to the Maximo II VR  defibrillator, which was serial, #[id number] and placed back in the  subcutaneous pocket.  The generator secured with silk suture.  An  additional Kanamycin was utilized to irrigate the pocket and the patient  was again deeply sedated with defibrillation threshold testing.   After the patient was again deeply sedated, VF was induced with T-wave   shock.  A 15-joules shock was then delivered again failing to terminate  VF, this time a 25-joules shock was delivered, which terminated VF and  restored sinus rhythm; 5 minutes was allowed to elapse and final DFT  test carried out.  Again, VF was induced with a T-wave shock and again a  25-joules shock was delivered, which terminated VF and restored sinus  rhythm.  At this point, no additional defibrillation threshold testing  was carried out and the incision was closed with 2-0 Vicryl followed by  layer 4-0 Vicryl.  Benzoin was painted on the skin.  Steri-Strips were  applied and pressure dressing was placed and the patient was returned to  room in satisfactory condition.   COMPLICATIONS:  There are no immediate ECG complications.   RESULTS:  Demonstrate a successful implantation of the Medtronic single  chamber defibrillator in a patient with long QT syndrome and  resuscitated VF arrest.  It demonstrates an elevated defibrillation  threshold for unclear clinical reason as well as decreased sensing  parameters throughout the RV myocardium.       Doylene Canning. Ladona Ridgel, MD  Electronically Signed     GWT/MEDQ  D:  01/07/2008  T:  01/08/2008  Job:  [id number]   cc:   Madaline Savage, M.D.

## 2010-12-17 NOTE — Assessment & Plan Note (Signed)
[facility]                         ELECTROPHYSIOLOGY OFFICE NOTE   NAME:[name], [name]                    MRN:          [id number]  DATE:04/13/2008                            DOB:          Apr 18, 1966    [name] returns today for followup.  She is a very pleasant patient  of Dr. Orvan Falconer with a history of probable long QT syndrome with status  post resuscitated cardiac arrest.  She initially had some  encephalopathy, which is resolved.  She returns today for followup.  She  is status post ICD insertion for secondary prevention of recurrent  arrhythmias.  It should be noted that her ICD implant was made much more  difficult bothered by very small R-waves at multiple sites during her  implantation.  She returns today for additional evaluation.  She denies  chest pain.  She denies any intercurrent IC therapies.   PHYSICAL EXAMINATION:  GENERAL:  On physical exam today, she is a  pleasant well-appearing woman in no acute distress.  VITAL SIGNS:  Blood pressure today was 130/75, pulse was 60 and regular,  respirations were 18, weight was 140 pounds.  HEENT:  Normal.  NECK:  No jugular venous distention.  No thyromegaly.  Trachea was  midline.  LUNGS:  Clear bilaterally to auscultation.  No wheezes, rales, or  rhonchi are present.  CARDIOVASCULAR:  Revealed a regular rate and rhythm with normal S1 and  S2.  ABDOMEN:  Soft and nontender.  EXTREMITIES:  Demonstrate no cyanosis, clubbing, or edema.  Pulses were  2+ and symmetric.   MEDICATIONS:  1. Potassium 20 mEq a day.  2. Metoprolol ER 50 mg half tablet daily.  3. Nifedipine 10 mg daily.   IMPRESSION:  1. Resuscitated ventricular fibrillation arrest.  2. Probable long QT syndrome.  3. Hypertension.   DISCUSSION:  Ms. Finchum is stable today.  Her defibrillator is working  normally.  Her R-waves are decreased somewhat between 2-1/2 and 3, but  they have been chronically low and I should  note that at implant  multiple sites were attempted for satisfactory device insertion and R  waves were decreased throughout the myocardium.   PLAN:  To see the patient back in the office in several months.  I have  also recommended that she undergo Familion testing to see if she what  type of long QT she might have.     Doylene Canning. Ladona Ridgel, MD  Electronically Signed    GWT/MedQ  DD: 04/13/2008  DT: 04/14/2008  Job #: [id number]   cc:   Madaline Savage, M.D.

## 2010-12-17 NOTE — Discharge Summary (Signed)
[name], [name]             ACCOUNT NO.:  [id number]   MEDICAL RECORD NO.:  [id number]          PATIENT TYPE:  INP   LOCATION:  3738                         FACILITY:  [facility]   PHYSICIAN:  Doylene Canning. Ladona Ridgel, MD    DATE OF BIRTH:  [birth year]   DATE OF ADMISSION:  09/01/2008  DATE OF DISCHARGE:  09/04/2008                               DISCHARGE SUMMARY   This patient has no known drug allergies.   TIME FOR THIS DICTATION AND EXAM:  Greater than 40 minutes.   FINAL DIAGNOSES:  1. Recurrent ventricular fibrillation/incessant premature ventricular      contractions in patient with history of long QT.  2. Appropriate implantable cardioverter-defibrillator discharges this      admission.  3. Stat intravenous replenishment and augmentation of potassium and      magnesium.  4. Stat intravenous Lopressor 5 mg.  5. Device overdrive suppression of premature ventricular contractions      coupled with intravenous lidocaine drip to shorten QT interval.  6. Home September 04, 2008 on potassium/Toprol-XL/mexiletine new      medication 150 mg b.i.d.  7. Echocardiogram this admission September 01, 2008 overall left      ventricular systolic function appears severely depressed.      Recommend repeat study with heart rate and ectopy slows.   PROCEDURE THIS ADMISSION:  Device overdrive suppression of PVCs in a  setting of IV lidocaine drip.   BRIEF HISTORY:  [name] is a 45 year old female.  She has a history  of out of hospital resuscitated ventricular fibrillation arrest January 03, 2008.  She has long QT syndrome.  She had a Medtronic single chamber  cardioverter defibrillator implanted, a Medtronic Maxima January 07, 2008.  Of note, defibrillator threshold study showed high thresholds.  The  patient has had no problems since the implantation in June until the  evening of August 31, 2008.   The patient was sitting in bed.  She had a feeling of her heart racing  maybe more like a spasm she says.   She got transiently lightheaded.  She  laid back in bed and the feeling passed.  She called the office and  discussed with the office that she had had a little more than usual  caffeine intake.  The patient did not relate any shock because she did  not feel one.   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  The morning of  September 01, 2008, she did feel ICD discharges x2 around 9 o'clock.  She  called our office.  They encouraged her to come to the emergency room.  The device was interrogated there.  She had totally three episodes  including the episode in the evening of August 31, 2008 which was also  defibrillator therapy.  The electrogram looks like polymorphic  ventricular tachycardia.   MEDICATIONS:  1. Toprol-XL 50 mg one-half tablet daily.  2. K-Dur 20 mEq which she takes at night.  3. Procardia 20 mg daily.   PLAN:  To admit the patient to a high acuity telemetry either cardiac  intensive  care for recurrent V-fib/long QT with appropriate ICD  therapies.  She will have 2 grams of IV magnesium to run full out.  She  will be given 5 mg of IV Lopressor as well as 40 mEq of potassium.  The  patient notes that she takes her potassium in the evening.   HOSPITAL COURSE:  The patient presents emergently to the emergency room  on September 01, 2008.  She has recurrent V-fib.  She is cardioverted out  of this rhythm by her device.  Prior to admission, she had had three  shocks.  During interrogation and during medication administration, she  did have another episode of ventricular fibrillation which required  therapy.  She was given 2 grams of IV magnesium as quickly as could be  administered.  She had already received her 40 mEq of potassium as well  as 5 mg of IV Lopressor.  A lidocaine bolus and drip was started, the  bolus 13 mg and the drip at 2 mg per minute.  The patient did not have  any further ventricular fibrillation.  The device was reprogrammed to  VVI at 80  beats per minute.  This was done in the evening of September 01, 2008 after overdrive suppression throughout the day.  A 2-D  echocardiogram was taken which showed severely depressed ejection  fraction with a recommendation that the echocardiogram be repeated at a  later date when the heart rate was slower and the amount of ectopy had  decreased.  The patient was observed pacing at VVI 80 beats per minute  for a 60-hour period.  On the morning of discharge, the patient's pacing  rate was decreased to 50 beats per minute at VVI.  The patient ready for  discharge September 04, 2008.  She goes home with a new medication:  1. Mexiletine 150 mg b.i.d.  2. Potassium chloride 20 mEq daily.  3. Toprol-XL 50 mg tablets one-half tablet daily.  4. Nifedipine 10 mg daily.   She follows up with Dr. Ladona Ridgel at [facility], [address] Friday October 06, 2008 at 10 o'clock and she will call Dr.  Truett Perna office at [phone] for an appointment in 2-3 days.  Once again  a repeat echocardiogram will be performed after a sufficient amount of  time has passed from this admission as determined by Dr. [name].  Serum  electrolytes on September 03, 2008 sodium 143, potassium 4.4, chloride  109, carbonate 27, BUN is 11, creatinine 0.8 and glucose 108.  Complete  blood count September 01, 2008 hemoglobin 15, hematocrit 45.8, white cells  7.1, platelets of 209.  Troponin 1 studies 0.03, 0.03 and 0.02.  Magnesium this admission 2.3.  TSH this admission 1.417.      Maple Mirza, PA      Doylene Canning. Ladona Ridgel, MD  Electronically Signed    GM/MEDQ  D:  09/04/2008  T:  09/05/2008  Job:  [id number]   cc:   Madaline Savage, M.D.  Doylene Canning. Ladona Ridgel, MD

## 2010-12-17 NOTE — H&P (Signed)
NAMECORA, [name]             ACCOUNT NO.:  [id number]   MEDICAL RECORD NO.:  [id number]          PATIENT TYPE:  INP   LOCATION:  2304                         FACILITY:  [facility]   PHYSICIAN:  Doylene Canning. Ladona Ridgel, MD    DATE OF BIRTH:  1966-04-04   DATE OF ADMISSION:  09/01/2008  DATE OF DISCHARGE:                              HISTORY & PHYSICAL   ADMISSION DIAGNOSIS:  Recurrent ventricular fibrillation, resulting in  implantable cardioverter-defibrillator shocks.   HISTORY OF PRESENTING ILLNESS:  The patient is a very pleasant 45-year-  old woman with a history of sudden cardiac death secondary to long QT  syndrome.  She was resuscitated successfully and underwent ICD  implantation back in June.  The patient was noted at that time to have  very small R waves when implantation of her device was carried out.  The  patient has been stable with no recurrent therapies or palpitations and  she notes that about a week ago she went on a diet.  She had gained 15  pounds in the last 6 months.  She denies taking any dietary stimulants  or supplements.  She was in her usual state of health last night and  when while she was reading with one of her children, she felt  palpitations and then felt a strange feeling inside.  She then states  that she felt some chest pressure.  She did not specifically report the  sensation of an ICD shock.  The patient had another episode this morning  where she is certain she was shocked along with additional spells and  presented to our office where she was found to have recurrent VF and  nonsustained polymorphous VT.  The patient was admitted to the hospital  here.  In the emergency room, she had a third episode of VF and was  appropriately treated with her defibrillator.  She is admitted now for  additional evaluation.  Electrolytes presently at the time of dictation  are not available.  She denies any problems with fevers or chills or  other issues.   FAMILY  HISTORY:  Negative for sudden cardiac death.   PAST MEDICAL HISTORY:  Really unremarkable.  There is a questionable  history of asthma in the past.   SOCIAL HISTORY:  The patient is married.  She denies tobacco or ethanol  abuse.  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   REVIEW OF SYSTEMS:  Otherwise, negative except as noted in the HPI.  She  has had no other symptoms except for the arrhythmias that she previously  notes or currently notes.   PHYSICAL EXAMINATION:  GENERAL:  She is a pleasant 45 year old woman who  is in no acute distress.  VITAL SIGNS:  Blood pressure is 120/80, the pulse 72 and irregular, the  respirations are 20, temperature is 98, and oxygen saturation 100%.  HEENT:  Normocephalic and atraumatic.  Pupils are equal and round.  Oropharynx is moist.  Sclerae anicteric.  NECK:  No jugular venous distention.  There is no thyromegaly.  Trachea  is midline.  LUNGS:  Clear bilaterally to auscultation.  No wheezes, rales, or  rhonchi present.  No increased work of breathing.  CARDIOVASCULAR:  Irregular rhythm with normal S1 and S2.  There were no  murmurs, rubs, or gallops.  ABDOMEN:  Soft and nontender.  There is no organomegaly.  EXTREMITIES:  No cyanosis, clubbing, or edema.  NEUROLOGIC:  Alert and oriented x3.  Cranial nerves intact.  Strength is  5/5 and symmetric.   The EKG demonstrates sinus rhythm with frequent ventricular ectopy and a  prolongation of the QT interval.  Labs are pending as previously noted.   IMPRESSION:  1. Recurrent ventricular fibrillation secondary to long QT syndrome,      etiology for initiating the sequence at present is unknown.  2. History of ventricular fibrillation arrest status post implantable      cardioverter-defibrillator insertion.   DISCUSSION:  We will plan to admit the patient to the ICU.  We will  start her on lidocaine in hopes this might shorten up her QT interval.  We will start out  with ventricular pacing at 100 beats per minute and  then over time decrease the pacing rate.  We will be sure that we  replete her electrolytes and magnesium.  Additional treatment will be  based on the patient's symptoms.   ADDENDUM:  We will plan on giving the patient IV Lopressor as well.      Doylene Canning. Ladona Ridgel, MD  Electronically Signed     GWT/MEDQ  D:  09/01/2008  T:  09/02/2008  Job:  [id number]   cc:   Madaline Savage, M.D.

## 2010-12-17 NOTE — Discharge Summary (Signed)
[name], [place]             ACCOUNT NO.:  [id number]   MEDICAL RECORD NO.:  [id number]          PATIENT TYPE:  INP   LOCATION:  2028                         FACILITY:  [facility]   PHYSICIAN:  Felipa Evener, MD  DATE OF BIRTH:  [birth year]   DATE OF ADMISSION:  01/03/2008  DATE OF DISCHARGE:  01/10/2008                               DISCHARGE SUMMARY   DISCHARGE DIAGNOSES:  1. Ventilator-dependent respiratory arrest secondary to cardiac      arrest.  2. Witnessed cardiac arrest, with ventricular fibrillation and      prolonged QT interval, requiring hypothermia protocol.  3. Metabolic acidosis.  4. Hypokalemia.  5. Hypocalcemia.   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  She has no known past medical  history other than having a history of questionable seizure, being  evaluated by Dr. Thad Ranger in May 2008.  She was noted to have a  spontaneous cardiac arrest, with immediate CPR initiated by her husband.  Emergency medical service arrived and found her to be in ventricular  fibrillation and required 1 shock.  She returned to a viable rhythm.  She was transferred to [facility] to the pulmonary critical  care service and underwent hypothermia protocol.   PROCEDURE:  1. She had a left internal jugular triple-lumen catheter placed on      January 03, 2008 and taken out on January 09, 2008.  2. Right radial arterial line January 03, 2008 to January 08, 2008.  3. Endotracheal tube from January 03, 2008 to January 05, 2008.  4. Foley catheter from January 03, 2008 to January 06, 2008.  5. On January 07, 2008, she had a single-chamber AICD implanted by  Dr.      Lewayne Bunting.  6. On January 05, 2008, she had a right and left heart catheterizations      per Dr.  [name].  She had an ejection fraction of 50%, a cardiac      index of 3.6 by the Fick equation, and anteroapical infarct.  7. Best practice consisted of proton pump inhibitors with  Protonix,      sequential compression devices, and heparin.   LABORATORY DATA:  Arterial blood gas:  pH 7.4, PCO2 37, PO2 132, with a  bicarbonate of 22 on 2.5 liters.  Hemoglobin 10.4, hematocrit 30.8, WBC  7.3, platelets 205.  Sodium 138, potassium 3.7, chloride 99, CO2 30, BUN  11, creatinine 0.69, glucose 105.  PT 16, INR 1.3, PTT 78.  __________  was 0.25.  AST is 73, ALT is 134, alkaline phosphatase 45, total  bilirubin 0.7, albumin 2.4.  Troponin 0.40, CK is 1028, CK-MB is 67.9.  Calcium is 9.3, magnesium 1.9, phosphorus 3.7.  Urinalysis was  essentially negative.  Urine drug screen was negative.  Lactic acid was  noted to be 8.7.  BNP was 68.  Chest x-ray showed status post pacemaker  insertion on the left.  CT of the head:  Stable, normal, noncontrast  appearance of the brain.   HOSPITAL COURSE:  1. Ventilator-dependent respiratory  failure secondary to cardiac      arrest.  She required mechanical ventilatory support while      undergoing hypothermia protocol.  She was successfully liberated      from mechanical ventilatory support by January 05, 2008.  She had no      further pulmonary issues.  2. Status post cardiac arrest, witnessed, with ventricular      fibrillation, requiring a single shock.  She was admitted to [facility].  Hypothermia protocol was initiated.  She was cooled      and subsequently rewarmed.  She was positive for anteroapical MI by      cardiac catheterization that was performed on January 05, 2008 by Dr.      Chanda Busing.  She is being followed by [facility]      and Dr. [name] on an outpatient basis.  She has a prolonged QT      interval, and this will be monitored as an outpatient, and will      keep her potassium 4 or greater.  She has also been initiated on      cardiac rehab phase II.  She had an AICD placed by Dr. Lewayne Bunting      on January 07, 2008.  She will follow up with Dr. Lewayne Bunting as an      outpatient for further  evaluation and treatment.  3. Metabolic acidosis, with elevated lactic acid secondary to cardiac      arrest.  This resolved.  4. Hypokalemia.  This resolved.  Her last potassium was 3.7.  She will      continue on K-Dur, with the goal of her potassium being 4 or      greater.  5. Hypocalcemia has been resolved.  6. Acidosis has been resolved.   DISCHARGE MEDICATIONS:  1. Toprol-XL 50 mg 1 tablet daily.  2. K-Dur 20 mEq daily.  3. Procardia 20 mg 1 tablet 3 times a day.   She is to follow up with Dr. Lewayne Bunting at [facility] and  Electrophysiology Thursday April 13, 2008 at 2 p.m.  She is to  follow up with [facility] on Monday August 25, 2008 at 9:30 a.m. at  [address]  She is to follow up with Dr. Chanda Busing  on February 02, 2008 at 4 p.m.  She has been instructed to make an  appointment to follow up with Dr. Thad Ranger of [facility],  whom she has seen in the past for seizures.   DIET:  Low-sodium, heart-healthy diet.   WOUND CARE:  She has been instructed to care for her AICD site.   DISPOSITION/CONDITION ON DISCHARGE:  Improved.  Mechanical ventilatory  support has been eradicated.  Her prolonged QT interval is being  monitored by [facility].  Her history of ventricular  fibrillation cardiac arrest has been addressed with implantation of an  internal automatic cardiac defibrillator, being evaluated by Dr. Lewayne Bunting.      Devra Dopp, MSN, ACNP      Felipa Evener, MD  Electronically Signed    SM/MEDQ  D:  01/10/2008  T:  01/10/2008  Job:  [id number]   cc:   Doylene Canning. Ladona Ridgel, MD  Madaline Savage, M.D.  Michael L. Thad Ranger, M.D.

## 2010-12-17 NOTE — Assessment & Plan Note (Signed)
[facility]                         ELECTROPHYSIOLOGY OFFICE NOTE   CRYSTOL, WALPOLE                    MRN:          [id number]  DATE:10/06/2008                            DOB:          02/13/1966    HISTORY OF PRESENT ILLNESS:  Ms. Krupka returns today for followup.  She is a very pleasant 45 year old woman with a history of prolonged QT  and VF arrest who was successfully resuscitated and underwent ICD  insertion just about a year ago.  The patient had a recurrent episode  several weeks ago and was hospitalized again with VF.  She returns today  for followup.  In the interim, we decided to start her on low-dose  mexiletine to see if this might reduce her ICD therapies.  She had no  other complaints today.   CURRENT MEDICATIONS:  1. Potassium 20 mEq daily.  2. Metoprolol ER 50 mg half tablet daily.  3. Nifedipine 10 mg daily.  4. Mexiletine 150 mg usually taken once daily, though occasionally      twice daily.   PHYSICAL EXAMINATION:  GENERAL:  On exam, she is a pleasant well-  appearing 45 year old woman in no distress.  VITAL SIGNS:  Blood pressure was 90/60, the pulse was 50 and regular,  the respirations were 18, the weight was 134 pounds.  NECK:  No jugular venous distention.  LUNGS:  Clear bilaterally to auscultation.  No wheezes, rales, or  rhonchi are present.  CARDIOVASCULAR:  Regular bradycardia with normal S1 and split S2.  ABDOMEN:  Soft, nontender.  EXTREMITIES:  Demonstrate no edema.   Interrogation of her defibrillator demonstrates a Medtronic Maximo II.  The R waves were 3.  The impedance was 475.  The threshold 0.7, 5.4.  The battery voltage was 3.16 volts.  There are no intercurrent IC  therapies since we last checked her.  She was V pacing 40% of the time.   IMPRESSION:  1. Ventricular fibrillation arrest, status post recurrent ventricular      fibrillation.  2. Probable long QT syndrome, though she did have a  clearly negative      Familion genetic test.  Despite this, her 12-lead EKG demonstrates      prolongation of the QT interval.  3. Hypertension.  4. Sinus bradycardia.   DISCUSSION:  Ms. Lomanto is stable and a bit concerned by her  bradycardia today.  Previously, her sinus rates were in the 70s, and  there is no bradycardia.  The only difference is that she is now on  mexiletine and I am wondering whether this has had some effect on her  sinus node function.  Despite this, she has not been symptomatic,  whatsoever, and for this reason, I have recommended period of watchful  waiting.  Of course, maintaining satisfactory potassium levels.  Continue her beta-blocker and regular daily exercise will be important  for this patient.  I will see her back in several months.     Doylene Canning. Ladona Ridgel, MD  Electronically Signed    GWT/MedQ  DD: 10/06/2008  DT: 10/07/2008  Job #: [id number]   cc:  Madaline Savage, M.D.

## 2010-12-20 NOTE — Discharge Summary (Signed)
[facility]  PatientTAMURA [name]                     MRN: [id number] Adm. Date:  [id number] Disc. Date: [id number] Attending:  Merrily Pew Dictator:   Gwendolyn Fill. [place], P.A.                           Discharge Summary  DISCHARGE DIAGNOSES:          1. Intrauterine pregnancy at 41 weeks, delivered.                               2. History of prior cesarean section, desired trial                                  of labor.                               3. Status post vaginal birth after cesarean section.  HISTORY OF PRESENT ILLNESS:   This is a 45 year old female, gravida 5, para 3, abortus 1, with an EDC of August 12, 1999.  Prenatal course had been complicated by a history of prior low transverse cesarean section and desired trial of labor.  HOSPITAL COURSE:              On August 21, 1999, the patient was admitted at 1+ weeks for induction and subsequently was begun on Pitocin.  On August 21, 1999, at 1858, the patient underwent a spontaneous vaginal delivery of a female, Apgars of 9 and 9.  Weight of 7 pounds 9 ounces.  There was a midline episiotomy which was repaired.  There were no complications.  Note that this was a successful vaginal birth after cesarean section.  Postpartum, the patient remained afebrile, voiding, and in stable condition.  She was discharged to home on August 23, 1999, and given [facility]cology postpartum instructions as well as postpartum booklet.  ACCESSORY CLINICAL DATA:      The patient is A positive, rubella immune.  On August 22, 1999, hemoglobin was 11.1, hematocrit 32.4.  DISPOSITION:                  The patient was discharged to home and informed to return to the office in six weeks and if she has any problems prior to that time to be seen in the office.  She was given a prescription for Tylox p.r.n. pain. DD:  09/10/99 TD:  09/10/99 Job: [id number]

## 2010-12-20 NOTE — Letter (Signed)
May 05, 2008    Tracy H. Ace Gins, MD  [address], [address]  [place], [place] [address]   RE:  [name], [name]  MRN:  [id number]  /  DOB:  May 29, 1966   Dear [name],   Today, I am referring you a family of patient's for additional  evaluation.  The patient of mine is a woman named [name], who I  met several months ago when she presented to the hospital with a VF  arrest.  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  My evaluation was notable in that the  patient's QT interval was approximately 700 milliseconds at the time of  her arrest with the QT interval decreasing by discharge to the 480  millisecond range.  A clinical diagnosis of prolonged QT syndrome was  made at that time.  The patient had an ICD implanted and was placed on a  beta blocker and she has been arrhythmia free and defibrillator therapy  free since then.  The patient has 4 children.  I have not seen any of  these children in clinic, but they have all come to our office for 12-  lead EKGs and the copies of these EKGs will be sent to you with my  letter.  The 4 children are aged 47, 74, 14, and 8.   Review of the electrocardiograms demonstrates borderline prolongation of  the QT interval in both the 45 year old [name]) and the 16-year-  old [name]).  None of these children have had any clinical  events.  Review of their T-waves does demonstrate fairly abnormal T-wave  morphologies and as noted previously borderline QT intervals.  It should  be noted that the mother, [name] is currently undergoing  evaluation with a Familion genetic test with the anticipation  being that  once this was accomplished, and if there is a gene found, that her  children could be tested for the same gene.  As might be expected, the  family is very anxious as they have certainly read about long QT  syndrome and understand the potential implications of this diagnosis  particularly as it pertains to the children.  Please do not hesitate to  contact me for any questions.  They are very interested in seeking your  opinion about additional treatment.  I will know parenthetically that in  the patients who I have followed with diagnosed long QT syndrome and  symptoms who have adult children that prophylactic beta blockers has  been my therapy of choice for these, though I find that in young adults  taking beta-blockers prophylactically it has been quite difficult to  encourage them to do.  I look forward to any comments and thoughts you  might have regarding the [place] family.    Sincerely,      Tracy Canning. Ladona Ridgel, MD  Electronically Signed    GWT/MedQ  DD: 05/05/2008  DT: 05/05/2008  Job #: [id number]

## 2010-12-20 NOTE — Letter (Signed)
October 20, 2008    [address]  Attn: Lonn Georgia Request  [address]  [place], [place]  [address]   RE:  [name], [name]  MRN:  [id number]  /  DOB:  01/21/1966   To Whom It May Concern,   [name] is a cardiology patient of mine.  She has life-  threatening cardiac arrhythmia's and has a defibrillator and is unable  to drive.  Please excuse her from jury duty.    Sincerely,      Doylene Canning. Ladona Ridgel, MD  Electronically Signed    GWT/MedQ  DD: 10/20/2008  DT: 10/20/2008  Job #: [id number]

## 2011-01-22 ENCOUNTER — Other Ambulatory Visit: Payer: Self-pay | Admitting: Internal Medicine

## 2011-02-10 ENCOUNTER — Encounter: Payer: Self-pay | Admitting: Gynecology

## 2011-02-11 ENCOUNTER — Encounter (INDEPENDENT_AMBULATORY_CARE_PROVIDER_SITE_OTHER): Payer: BC Managed Care – PPO | Admitting: Gynecology

## 2011-02-11 ENCOUNTER — Other Ambulatory Visit: Payer: Self-pay | Admitting: *Deleted

## 2011-02-11 ENCOUNTER — Other Ambulatory Visit (HOSPITAL_COMMUNITY)
Admission: RE | Admit: 2011-02-11 | Discharge: 2011-02-11 | Disposition: A | Discharge status: Home / Self Care | Payer: BC Managed Care – PPO | Source: Ambulatory Visit | Attending: Gynecology | Admitting: Gynecology

## 2011-02-11 ENCOUNTER — Other Ambulatory Visit: Payer: Self-pay | Admitting: Gynecology

## 2011-02-11 DIAGNOSIS — R634 Abnormal weight loss: Secondary | ICD-10-CM

## 2011-02-11 DIAGNOSIS — Z124 Encounter for screening for malignant neoplasm of cervix: Secondary | ICD-10-CM | POA: Insufficient documentation

## 2011-02-11 DIAGNOSIS — Z13220 Encounter for screening for lipoid disorders: Secondary | ICD-10-CM

## 2011-02-11 DIAGNOSIS — Z833 Family history of diabetes mellitus: Secondary | ICD-10-CM

## 2011-02-11 DIAGNOSIS — Z01419 Encounter for gynecological examination (general) (routine) without abnormal findings: Secondary | ICD-10-CM

## 2011-02-11 DIAGNOSIS — R823 Hemoglobinuria: Secondary | ICD-10-CM

## 2011-02-12 ENCOUNTER — Encounter: Payer: Self-pay | Admitting: *Deleted

## 2011-02-13 ENCOUNTER — Encounter (INDEPENDENT_AMBULATORY_CARE_PROVIDER_SITE_OTHER): Payer: BC Managed Care – PPO | Admitting: Gynecology

## 2011-02-13 DIAGNOSIS — Z30431 Encounter for routine checking of intrauterine contraceptive device: Secondary | ICD-10-CM

## 2011-02-13 HISTORY — PX: INTRAUTERINE DEVICE INSERTION: SHX323

## 2011-02-24 NOTE — Progress Notes (Signed)
This encounter was created in error - please disregard.

## 2011-03-17 ENCOUNTER — Ambulatory Visit (INDEPENDENT_AMBULATORY_CARE_PROVIDER_SITE_OTHER): Payer: BC Managed Care – PPO | Admitting: Gynecology

## 2011-03-17 ENCOUNTER — Encounter: Payer: Self-pay | Admitting: Gynecology

## 2011-03-17 VITALS — BP 116/70

## 2011-03-17 DIAGNOSIS — Z30431 Encounter for routine checking of intrauterine contraceptive device: Secondary | ICD-10-CM

## 2011-03-17 NOTE — Progress Notes (Signed)
Patient is a 45 year old gravida 5 para 4 Ab1 who was seen in the office on July 12 who underwent placement of a ParaGard T380A IUD. Presented to the office for followup. She is asymptomatic  Pelvic exam: Bartholin urethra Skene glands: Within normal limits Vagina: No lesions or discharge Cervix: IUD string seen no discharge Bimanual exam: Uterus anteverted normal size shape and consistency adnexa without any palpable masses or tenderness  Patient's recent mammogram and Pap smear were all normal her fasting blood sugar TSH fasting lipid profile CBC urinalysis were all normal as well. She is otherwise instructed to return back to the office in one year or when necessary.

## 2011-05-01 LAB — CBC
HCT: 26.2 — ABNORMAL LOW
HCT: 26.7 — ABNORMAL LOW
HCT: 30.8 — ABNORMAL LOW
HCT: 39.4
Hemoglobin: 13.5
Hemoglobin: 9 — ABNORMAL LOW
Hemoglobin: 9.3 — ABNORMAL LOW
Hemoglobin: 9.7 — ABNORMAL LOW
MCHC: 33.9
MCHC: 34.2
MCV: 93.5
MCV: 93.7
MCV: 94.1
Platelets: 150
Platelets: 152
Platelets: 267
RBC: 2.86 — ABNORMAL LOW
RBC: 2.9 — ABNORMAL LOW
RBC: 2.96 — ABNORMAL LOW
RBC: 3.27 — ABNORMAL LOW
RDW: 13.2
RDW: 13.3
WBC: 10.1
WBC: 11.4 — ABNORMAL HIGH
WBC: 16.5 — ABNORMAL HIGH
WBC: 7.3
WBC: 8
WBC: 9.5

## 2011-05-01 LAB — POCT I-STAT 3, VENOUS BLOOD GAS (G3P V)
Acid-base deficit: 5 — ABNORMAL HIGH
O2 Saturation: 68
TCO2: 20

## 2011-05-01 LAB — BASIC METABOLIC PANEL
BUN: 10
BUN: 2 — ABNORMAL LOW
BUN: 4 — ABNORMAL LOW
BUN: 4 — ABNORMAL LOW
BUN: 5 — ABNORMAL LOW
BUN: 6
BUN: 6
BUN: 7
BUN: 7
BUN: 9
BUN: 9
CO2: 18 — ABNORMAL LOW
CO2: 20
CO2: 25
CO2: 26
CO2: 26
CO2: 29
CO2: 30
Calcium: 6.4 — CL
Calcium: 6.5 — ABNORMAL LOW
Calcium: 6.6 — ABNORMAL LOW
Calcium: 6.8 — ABNORMAL LOW
Calcium: 7 — ABNORMAL LOW
Calcium: 7.7 — ABNORMAL LOW
Calcium: 8.2 — ABNORMAL LOW
Calcium: 8.6
Calcium: 9.3
Chloride: 102
Chloride: 105
Chloride: 105
Chloride: 106
Chloride: 108
Chloride: 112
Chloride: 113 — ABNORMAL HIGH
Chloride: 113 — ABNORMAL HIGH
Chloride: 116 — ABNORMAL HIGH
Chloride: 99
Creatinine, Ser: 0.37 — ABNORMAL LOW
Creatinine, Ser: 0.43
Creatinine, Ser: 0.51
Creatinine, Ser: 0.52
Creatinine, Ser: 0.53
Creatinine, Ser: 0.53
Creatinine, Ser: 0.54
Creatinine, Ser: 0.58
Creatinine, Ser: 0.62
Creatinine, Ser: 0.63
Creatinine, Ser: 0.63
Creatinine, Ser: 0.66
Creatinine, Ser: 0.69
GFR calc Af Amer: >60
GFR calc Af Amer: >60
GFR calc Af Amer: >60
GFR calc Af Amer: >60
GFR calc Af Amer: >60
GFR calc Af Amer: >60
GFR calc Af Amer: >60
GFR calc Af Amer: >60
GFR calc Af Amer: >60
GFR calc Af Amer: >60
GFR calc Af Amer: >60
GFR calc Af Amer: >60
GFR calc Af Amer: >60
GFR calc non Af Amer: >60
GFR calc non Af Amer: >60
GFR calc non Af Amer: >60
GFR calc non Af Amer: >60
GFR calc non Af Amer: >60
GFR calc non Af Amer: >60
GFR calc non Af Amer: >60
GFR calc non Af Amer: >60
GFR calc non Af Amer: >60
GFR calc non Af Amer: >60
GFR calc non Af Amer: >60
GFR calc non Af Amer: >60
Glucose, Bld: 102 — ABNORMAL HIGH
Glucose, Bld: 105 — ABNORMAL HIGH
Glucose, Bld: 110 — ABNORMAL HIGH
Glucose, Bld: 112 — ABNORMAL HIGH
Glucose, Bld: 212 — ABNORMAL HIGH
Glucose, Bld: 288 — ABNORMAL HIGH
Glucose, Bld: 319 — ABNORMAL HIGH
Glucose, Bld: 86
Potassium: 2.8 — ABNORMAL LOW
Potassium: 2.9 — ABNORMAL LOW
Potassium: 3 — ABNORMAL LOW
Potassium: 3.5
Potassium: 3.7
Potassium: 3.7
Potassium: 3.7
Potassium: 3.8
Potassium: 3.8
Potassium: 4
Potassium: 4.1
Sodium: 137
Sodium: 137
Sodium: 140
Sodium: 140
Sodium: 140
Sodium: 141
Sodium: 141
Sodium: 141
Sodium: 142
Sodium: 142
Sodium: 143

## 2011-05-01 LAB — BLOOD GAS, ARTERIAL
Acid-base deficit: 2.9 — ABNORMAL HIGH
Acid-base deficit: 3.4 — ABNORMAL HIGH
Bicarbonate: 16.8 — ABNORMAL LOW
Bicarbonate: 20.9
Bicarbonate: 22.9
Drawn by: 270221
Drawn by: 275531
Drawn by: 275531
MECHVT: 480
MECHVT: 480
MECHVT: 500
MECHVT: 500
O2 Saturation: 98
O2 Saturation: 99
PEEP: 5
PEEP: 5
PEEP: 5
PEEP: 5
Patient temperature: 98.6
Patient temperature: 98.6
RATE: 14
RATE: 14
RATE: 22
RATE: 22
RATE: 22
TCO2: 17.2
TCO2: 17.4
pCO2 arterial: 24 — ABNORMAL LOW
pCO2 arterial: 30.2 — ABNORMAL LOW
pCO2 arterial: 30.7 — ABNORMAL LOW
pCO2 arterial: 33.2 — ABNORMAL LOW
pCO2 arterial: 37.4
pH, Arterial: 7.352
pH, Arterial: 7.375
pH, Arterial: 7.404 — ABNORMAL HIGH
pH, Arterial: 7.418 — ABNORMAL HIGH
pH, Arterial: 7.434 — ABNORMAL HIGH
pH, Arterial: 7.457 — ABNORMAL HIGH
pO2, Arterial: 127 — ABNORMAL HIGH
pO2, Arterial: 132 — ABNORMAL HIGH
pO2, Arterial: 97.5

## 2011-05-01 LAB — COMPREHENSIVE METABOLIC PANEL
ALT: 169 — ABNORMAL HIGH
ALT: 256 — ABNORMAL HIGH
AST: 208 — ABNORMAL HIGH
AST: 236 — ABNORMAL HIGH
AST: 99 — ABNORMAL HIGH
Albumin: 2.5 — ABNORMAL LOW
Albumin: 2.6 — ABNORMAL LOW
Albumin: 3.4 — ABNORMAL LOW
Alkaline Phosphatase: 45
Alkaline Phosphatase: 50
BUN: 11
BUN: 2 — ABNORMAL LOW
BUN: 5 — ABNORMAL LOW
CO2: 19
Calcium: 6.7 — ABNORMAL LOW
Calcium: 7.4 — ABNORMAL LOW
Chloride: 111
Chloride: 114 — ABNORMAL HIGH
Creatinine, Ser: 1.16
GFR calc Af Amer: >60
GFR calc Af Amer: >60
GFR calc Af Amer: >60
GFR calc non Af Amer: >60
Glucose, Bld: 124 — ABNORMAL HIGH
Potassium: 3.4 — ABNORMAL LOW
Potassium: 3.4 — ABNORMAL LOW
Sodium: 137
Sodium: 138
Total Bilirubin: 1
Total Bilirubin: 1
Total Protein: 4.3 — ABNORMAL LOW
Total Protein: 4.6 — ABNORMAL LOW

## 2011-05-01 LAB — DIFFERENTIAL
Basophils Absolute: 0
Basophils Relative: 0
Eosinophils Absolute: 0
Eosinophils Relative: 1
Eosinophils Relative: 1
Lymphocytes Relative: 38
Lymphs Abs: 1.1
Lymphs Abs: 6.3 — ABNORMAL HIGH
Monocytes Absolute: 0.8
Monocytes Relative: 10
Monocytes Relative: 2 — ABNORMAL LOW
Neutro Abs: 9.7 — ABNORMAL HIGH
Neutrophils Relative %: 74

## 2011-05-01 LAB — CK TOTAL AND CKMB (NOT AT ARMC)
CK, MB: 2.9
CK, MB: 27.2 — ABNORMAL HIGH
Relative Index: INVALID
Total CK: 611 — ABNORMAL HIGH
Total CK: 92

## 2011-05-01 LAB — URINALYSIS, ROUTINE W REFLEX MICROSCOPIC
Bilirubin Urine: NEGATIVE
Bilirubin Urine: NEGATIVE
Glucose, UA: >1000 — AB
Glucose, UA: NEGATIVE
Ketones, ur: NEGATIVE
Nitrite: NEGATIVE
Specific Gravity, Urine: 1.008
Specific Gravity, Urine: 1.017
Urobilinogen, UA: 0.2
pH: 5.5
pH: 7.5

## 2011-05-01 LAB — POCT I-STAT 3, ART BLOOD GAS (G3+)
Acid-base deficit: 5 — ABNORMAL HIGH
Bicarbonate: 16.3 — ABNORMAL LOW
Bicarbonate: 19.3 — ABNORMAL LOW
O2 Saturation: 100
O2 Saturation: 98
TCO2: 17
TCO2: 20
pCO2 arterial: 36.2
pH, Arterial: 7.262 — ABNORMAL LOW
pO2, Arterial: 102 — ABNORMAL HIGH
pO2, Arterial: 349 — ABNORMAL HIGH

## 2011-05-01 LAB — TROPONIN I: Troponin I: 0.1 — ABNORMAL HIGH

## 2011-05-01 LAB — CARBOXYHEMOGLOBIN
Carboxyhemoglobin: 0.5
Carboxyhemoglobin: 0.7
Carboxyhemoglobin: 0.7
Carboxyhemoglobin: 0.8
Carboxyhemoglobin: 0.9
Carboxyhemoglobin: 3.4 — ABNORMAL HIGH
Methemoglobin: 0.6
Methemoglobin: 0.6
Methemoglobin: 0.6
Methemoglobin: 0.8
Methemoglobin: 0.8
Methemoglobin: 2 — ABNORMAL HIGH
O2 Saturation: 85.1
O2 Saturation: 97.2
Total hemoglobin: 10.3 — ABNORMAL LOW
Total hemoglobin: 12 — ABNORMAL LOW
Total hemoglobin: 12.7
Total hemoglobin: 9 — ABNORMAL LOW
Total hemoglobin: 9 — ABNORMAL LOW
Total hemoglobin: 9.6 — ABNORMAL LOW

## 2011-05-01 LAB — T3 UPTAKE: T3 Uptake Ratio: 40.9 — ABNORMAL HIGH

## 2011-05-01 LAB — PATHOLOGIST SMEAR REVIEW

## 2011-05-01 LAB — HEPARIN LEVEL (UNFRACTIONATED)
Heparin Unfractionated: 0.18 — ABNORMAL LOW
Heparin Unfractionated: 1.65 — ABNORMAL HIGH

## 2011-05-01 LAB — LIPASE, BLOOD: Lipase: 17

## 2011-05-01 LAB — CARDIAC PANEL(CRET KIN+CKTOT+MB+TROPI)
CK, MB: 42.9 — ABNORMAL HIGH
CK, MB: 53.5 — ABNORMAL HIGH
Relative Index: 5.8 — ABNORMAL HIGH
Relative Index: 6 — ABNORMAL HIGH
Relative Index: 6.6 — ABNORMAL HIGH
Total CK: 1136 — ABNORMAL HIGH
Troponin I: 0.11 — ABNORMAL HIGH

## 2011-05-01 LAB — MAGNESIUM
Magnesium: 1.9
Magnesium: 2.2

## 2011-05-01 LAB — URINE CULTURE: Colony Count: >=100000

## 2011-05-01 LAB — URINE MICROSCOPIC-ADD ON

## 2011-05-01 LAB — LACTIC ACID, PLASMA: Lactic Acid, Venous: 8.7 — ABNORMAL HIGH

## 2011-05-01 LAB — RAPID URINE DRUG SCREEN, HOSP PERFORMED
Benzodiazepines: POSITIVE — AB
Cocaine: NOT DETECTED
Opiates: NOT DETECTED
Tetrahydrocannabinol: NOT DETECTED

## 2011-05-01 LAB — CULTURE, BLOOD (ROUTINE X 2)
Culture: NO GROWTH
Culture: NO GROWTH

## 2011-05-01 LAB — CULTURE, BAL-QUANTITATIVE W GRAM STAIN
Culture: NO GROWTH
Gram Stain: NONE SEEN

## 2011-05-01 LAB — TSH: TSH: 2.409

## 2011-05-01 LAB — PROTIME-INR
INR: 1.2
INR: 1.3
Prothrombin Time: 15.9 — ABNORMAL HIGH
Prothrombin Time: 16 — ABNORMAL HIGH

## 2011-05-01 LAB — CORTISOL: Cortisol, Plasma: 25.5

## 2011-05-01 LAB — PHOSPHORUS
Phosphorus: 1.1 — ABNORMAL LOW
Phosphorus: 2.1 — ABNORMAL LOW

## 2011-05-01 LAB — POCT CARDIAC MARKERS: CKMB, poc: 1.1

## 2011-05-01 LAB — AMYLASE: Amylase: 87

## 2011-05-01 LAB — OSMOLALITY: Osmolality: 298

## 2011-05-01 LAB — HCG, SERUM, QUALITATIVE: Preg, Serum: NEGATIVE

## 2011-05-01 LAB — APTT: aPTT: 28

## 2011-05-01 LAB — ALBUMIN: Albumin: 2.8 — ABNORMAL LOW

## 2011-05-21 ENCOUNTER — Ambulatory Visit: Payer: BC Managed Care – PPO | Admitting: Gynecology

## 2011-06-12 ENCOUNTER — Other Ambulatory Visit (HOSPITAL_COMMUNITY)
Admission: RE | Admit: 2011-06-12 | Discharge: 2011-06-12 | Disposition: A | Discharge status: Home / Self Care | Payer: BC Managed Care – PPO | Source: Ambulatory Visit | Attending: Gynecology | Admitting: Gynecology

## 2011-06-12 ENCOUNTER — Encounter: Payer: Self-pay | Admitting: Gynecology

## 2011-06-12 ENCOUNTER — Ambulatory Visit (INDEPENDENT_AMBULATORY_CARE_PROVIDER_SITE_OTHER): Payer: BC Managed Care – PPO | Admitting: Gynecology

## 2011-06-12 VITALS — BP 128/78

## 2011-06-12 DIAGNOSIS — N938 Other specified abnormal uterine and vaginal bleeding: Secondary | ICD-10-CM

## 2011-06-12 DIAGNOSIS — N949 Unspecified condition associated with female genital organs and menstrual cycle: Secondary | ICD-10-CM

## 2011-06-12 DIAGNOSIS — N6009 Solitary cyst of unspecified breast: Secondary | ICD-10-CM | POA: Insufficient documentation

## 2011-06-12 DIAGNOSIS — N63 Unspecified lump in unspecified breast: Secondary | ICD-10-CM

## 2011-06-12 MED ORDER — MEGESTROL ACETATE 40 MG PO TABS: 40.0000 mg | ORAL_TABLET | Freq: Two times a day (BID) | ORAL | Status: AC

## 2011-06-12 NOTE — Progress Notes (Signed)
45 year old gravida 5 para 4 Ab1 presented to the office today to discuss 2 issues 1 irregular cycle #2 left breast mass. She stated her cycles been regular she had a ParaGard IUD placed July 2012. Her cycles number in August she skipped September and October 18 2 recently she was still bleeding. She also noticed a left breast lump that comes and goes at time of this big her. She has had history of breast cysts in the past. Her last mammogram was in July 2012 which demonstrated bilateral breast cyst in different locations to the radiology report were benign findings and recommend screening mammogram in one year.  Breast exam: Breasts were examined sitting supine position it was evident that in the left anterior chest wall patient has a pacemaker. And a left breast mass was noted from the 1 to 2:00 position measuring 3-1/2 cm in size. It was mobile and nontender. Contralateral breast was normal. There was no supraclavicular axillary lymphadenopathy.  Pelvic exam: Bartholin urethra Skene glands: Within normal limits Vagina: Some dark red blood was still present in the vaginal vault but no active bleeding was noted. Cervix: IUD string was seen no gross lesions on inspection Uterus: Anteverted normal size shape and consistency Adnexa: No palpable masses or tenderness Rectal exam: Not done  Patient was counseled for the fine-needle aspiration of the left breast cyst. The area was cleansed with alcohol swab and a 23-gauge needle was introduced and approximately 20 cc of a milky like fluid was obtained which decompressed the cyst completely fluid sample was sent for cytology. Patient will return back to the office next week for an ultrasound as part of evaluation of her recent dysfunction uterine bleeding to measure the IUD still in the appropriate position and to get a better assessment of her ovaries. We'll check her TSH and prolactin today. And she was started on Megace 40 mg twice a day for 5 days. Of note  her urine trace test was negative in the office today.

## 2011-06-13 ENCOUNTER — Ambulatory Visit: Payer: BC Managed Care – PPO | Admitting: Gynecology

## 2011-06-13 ENCOUNTER — Telehealth: Payer: Self-pay | Admitting: *Deleted

## 2011-06-13 NOTE — Telephone Encounter (Signed)
Patient seen yesterday in the office for dysfunction bleeding as well as a left breast cyst. She underwent a fine-needle aspiration whereby approximately 20 cc of fluid were obtained from (. She does have an IUD. And was scheduled to return next week for an ultrasound. Meanwhile she was placed on Megace 40 mg instructed to take it for 5 days regardless of what her bleeding stops.  Patient will return back to the office next week for an ultrasound as part of evaluation of her recent dysfunction uterine bleeding to measure the IUD still in the appropriate position and to get a better assessment of her ovaries. We'll check her TSH and prolactin today. And she was started on Megace 40 mg twice a day for 5 days.

## 2011-06-13 NOTE — Telephone Encounter (Signed)
Pt was seen in office yesterday due to bleeding off and on for 3 weeks, she was given megace 40 mg twice a day for 5 days. Pt took 1 dose yesterday and period has completely stopped, pt wants to know should she continue the medication or should stop because she is no longer bleeding? Please advise.

## 2011-06-16 NOTE — Telephone Encounter (Signed)
Pt informed to take megace for 5 days as directed by the below.

## 2011-06-19 ENCOUNTER — Encounter: Payer: Self-pay | Admitting: Gynecology

## 2011-06-19 ENCOUNTER — Ambulatory Visit (INDEPENDENT_AMBULATORY_CARE_PROVIDER_SITE_OTHER): Payer: BC Managed Care – PPO | Admitting: Gynecology

## 2011-06-19 ENCOUNTER — Ambulatory Visit (INDEPENDENT_AMBULATORY_CARE_PROVIDER_SITE_OTHER): Payer: BC Managed Care – PPO

## 2011-06-19 VITALS — BP 130/70

## 2011-06-19 DIAGNOSIS — N854 Malposition of uterus: Secondary | ICD-10-CM

## 2011-06-19 DIAGNOSIS — N938 Other specified abnormal uterine and vaginal bleeding: Secondary | ICD-10-CM

## 2011-06-19 DIAGNOSIS — Z975 Presence of (intrauterine) contraceptive device: Secondary | ICD-10-CM

## 2011-06-19 DIAGNOSIS — IMO0001 Reserved for inherently not codable concepts without codable children: Secondary | ICD-10-CM | POA: Insufficient documentation

## 2011-06-19 DIAGNOSIS — N83209 Unspecified ovarian cyst, unspecified side: Secondary | ICD-10-CM

## 2011-06-19 DIAGNOSIS — N920 Excessive and frequent menstruation with regular cycle: Secondary | ICD-10-CM

## 2011-06-19 DIAGNOSIS — N949 Unspecified condition associated with female genital organs and menstrual cycle: Secondary | ICD-10-CM

## 2011-06-19 DIAGNOSIS — N7013 Chronic salpingitis and oophoritis: Secondary | ICD-10-CM

## 2011-06-19 MED ORDER — TRANEXAMIC ACID 650 MG PO TABS: ORAL_TABLET | ORAL | Status: DC

## 2011-06-19 NOTE — Progress Notes (Signed)
45 year old gravida 5 para 4 AB 1 who was seen in the office on November 8 complaining of a left breast lump which appeared to be a simple cyst which was aspirated for approximately 20 cc of fluid. Recent cytology report came back normal. She also had a ParaGard T380A IUD placed in July of 2012 and had had some irregular bleeding since then. She presented to the office today for followup to include an ultrasound.  The ultrasound demonstrated a normal-size uterus measuring 6.6 x 5.8 x 4.3 cm with an endometrial stripe of 3.5 mm. The IUD was found to be in the right position located in the intrauterine cavity. She had a thin-walled echo-free cyst avascular measuring 36 x 22 x 29 mm. There was also adjacent to that a thick wall tubular serpentine shaped tube measuring 32 x 10 x 32 mm consistent with hydrosalpinx. The left ovary was normal and there was no fluid in the cul-de-sac.  Findings discussed with the patient. And she will finish her Megace 40 mg twice a day for 5 more days to finish her bleeding. She will wait for her next cycle and said she bleeds for about 6 days but the first 3 days are heavy she was given a sample and a prescription for Lysteda 650 mg one by mouth 3 times a day for 5 days of each cycle. The risks benefits and pros and cons were discussed. She'll return to the office in 3 months for followup ultrasound on the ovarian cyst. Her last mammogram was July of this year and was normal with the exception of small cyst on both breasts.Marland Kitchen

## 2011-06-20 ENCOUNTER — Telehealth: Payer: Self-pay | Admitting: *Deleted

## 2011-06-20 NOTE — Telephone Encounter (Signed)
PHARMACY INFORMED WITH THE BELOW NOTE.

## 2011-06-20 NOTE — Telephone Encounter (Signed)
(  Pt seen on 06/19/11)Pharmacy called wanting to clarify prescription for lystada 650 mg. rx was sent lystada 650 mg 1 tab 3 times a day for 5 days. Pharmacy said medication is normally 2 tab 3 times a day for 5 days. Please advise.

## 2011-06-20 NOTE — Telephone Encounter (Signed)
Correction: Lysteda 650 mg 2 tablets 3 times a day during her menses for 5 days only (#30 tablets) refill x5.

## 2011-09-16 ENCOUNTER — Ambulatory Visit (INDEPENDENT_AMBULATORY_CARE_PROVIDER_SITE_OTHER): Payer: BC Managed Care – PPO | Admitting: Internal Medicine

## 2011-09-16 ENCOUNTER — Ambulatory Visit (INDEPENDENT_AMBULATORY_CARE_PROVIDER_SITE_OTHER): Payer: BC Managed Care – PPO

## 2011-09-16 ENCOUNTER — Encounter: Payer: Self-pay | Admitting: Internal Medicine

## 2011-09-16 ENCOUNTER — Encounter: Payer: Self-pay | Admitting: Gynecology

## 2011-09-16 ENCOUNTER — Ambulatory Visit (INDEPENDENT_AMBULATORY_CARE_PROVIDER_SITE_OTHER): Payer: BC Managed Care – PPO | Admitting: Gynecology

## 2011-09-16 VITALS — BP 116/70

## 2011-09-16 DIAGNOSIS — N938 Other specified abnormal uterine and vaginal bleeding: Secondary | ICD-10-CM

## 2011-09-16 DIAGNOSIS — N83209 Unspecified ovarian cyst, unspecified side: Secondary | ICD-10-CM

## 2011-09-16 DIAGNOSIS — N939 Abnormal uterine and vaginal bleeding, unspecified: Secondary | ICD-10-CM

## 2011-09-16 DIAGNOSIS — N926 Irregular menstruation, unspecified: Secondary | ICD-10-CM

## 2011-09-16 DIAGNOSIS — I469 Cardiac arrest, cause unspecified: Secondary | ICD-10-CM

## 2011-09-16 DIAGNOSIS — I4581 Long QT syndrome: Secondary | ICD-10-CM

## 2011-09-16 DIAGNOSIS — N7013 Chronic salpingitis and oophoritis: Secondary | ICD-10-CM

## 2011-09-16 DIAGNOSIS — N7011 Chronic salpingitis: Secondary | ICD-10-CM

## 2011-09-16 DIAGNOSIS — Z8742 Personal history of other diseases of the female genital tract: Secondary | ICD-10-CM

## 2011-09-16 DIAGNOSIS — Z95810 Presence of automatic (implantable) cardiac defibrillator: Secondary | ICD-10-CM

## 2011-09-16 LAB — ICD DEVICE OBSERVATION
DEV-0020ICD: NEGATIVE
FVT: 0
PACEART VT: 0
RV LEAD IMPEDENCE ICD: 475 Ohm
RV LEAD THRESHOLD: 1 V
TOT-0001: 14
TZAT-0001FASTVT: 1
TZAT-0001SLOWVT: 1
TZAT-0002FASTVT: NEGATIVE
TZAT-0002SLOWVT: NEGATIVE
TZAT-0012FASTVT: 200 ms
TZAT-0012SLOWVT: 200 ms
TZAT-0018FASTVT: NEGATIVE
TZAT-0019FASTVT: 8 V
TZON-0003VSLOWVT: 450 ms
TZST-0001FASTVT: 4
TZST-0001FASTVT: 5
TZST-0001FASTVT: 6
TZST-0001SLOWVT: 2
TZST-0001SLOWVT: 4
TZST-0001SLOWVT: 5
TZST-0002FASTVT: NEGATIVE
TZST-0002FASTVT: NEGATIVE
TZST-0002SLOWVT: NEGATIVE
TZST-0002SLOWVT: NEGATIVE
TZST-0002SLOWVT: NEGATIVE
VF: 0

## 2011-09-16 NOTE — Progress Notes (Signed)
HPI [name] returns today for followup. She is a 46 year old woman with a history of sudden cardiac death 22 ventricular fibrillation. She is genetic testing negative for long QT syndrome. She has done well in terms. She denies chest pain or shortness of breath. She notes one day of palpitations and dizziness associated with her missing her medications twice. No recent ICD shocks. No Known Allergies   Current Outpatient Prescriptions  Medication Sig Dispense Refill  . metoprolol succinate (TOPROL-XL) 50 MG 24 hr tablet       . mexiletine (MEXITIL) 150 MG capsule Take 1 capsule (150 mg total) by mouth 2 (two) times daily.  60 capsule  11  . potassium chloride SA (K-DUR,KLOR-CON) 20 MEQ tablet       . tranexamic acid (LYSTEDA) 650 MG TABS as needed. Take one three times a day with the start of your period. Do not take for more than 5 days.         Past Medical History  Diagnosis Date  . Ventricular fibrillation     LONG QT/IMPLANTABLE DEBRIBILATOR  . IUD     PARAGUARD IUD INSERTED 02-13-11.    ROS:   All systems reviewed and negative except as noted in the HPI.   Past Surgical History  Procedure Date  . Pacemaker insertion   . Tympanostomy tube placement   . Dilation and curettage of uterus 1999  . Cesarean section 1994  . Adenoidectomy   . Intrauterine device insertion February 13, 2011    PARAGUARD      Family History  Problem Relation Age of Onset  . Hypertension Father   . Heart disease Father     PACEMAKER INSERTED  . Diabetes Maternal Grandmother   . Cancer Maternal Grandmother     COLON CANCER     History   Social History  . Marital Status: Married    Spouse Name: N/A    Number of Children: N/A  . Years of Education: N/A   Occupational History  . Not on file.   Social History Main Topics  . Smoking status: Never Smoker   . Smokeless tobacco: Never Used  . Alcohol Use: Yes     occasionally  . Drug Use: No  . Sexually Active: Yes -- Female  partner(s)    Birth Control/ Protection: IUD   Other Topics Concern  . Not on file   Social History Narrative  . No narrative on file     BP 116/60  Pulse 63  Wt 57.153 kg (126 lb)  LMP 09/11/2011  Physical Exam:  Well appearing NAD HEENT: Unremarkable Neck:  No JVD, no thyromegally Lungs:  Clear with no wheezes, rales, or rhonchi. HEART:  Regular rate rhythm, no murmurs, no rubs, no clicks Abd:  soft, positive bowel sounds, no organomegally, no rebound, no guarding Ext:  2 plus pulses, no edema, no cyanosis, no clubbing Skin:  No rashes no nodules Neuro:  CN II through XII intact, motor grossly intact   DEVICE  Normal device function.  See PaceArt for details.   Assess/Plan:

## 2011-09-16 NOTE — Assessment & Plan Note (Signed)
She'll continue her current medical therapy.

## 2011-09-16 NOTE — Patient Instructions (Signed)
Your physician wants you to follow-up in: 12 months with Dr Taylor You will receive a reminder letter in the mail two months in advance. If you don't receive a letter, please call our office to schedule the follow-up appointment.   Remote monitoring is used to monitor your Pacemaker of ICD from home. This monitoring reduces the number of office visits required to check your device to one time per year. It allows us to keep an eye on the functioning of your device to ensure it is working properly. You are scheduled for a device check from home on 12/18/11. You may send your transmission at any time that day. If you have a wireless device, the transmission will be sent automatically. After your physician reviews your transmission, you will receive a postcard with your next transmission date.   

## 2011-09-16 NOTE — Assessment & Plan Note (Signed)
Her device is working normally. We'll plan to recheck in several months. 

## 2011-09-16 NOTE — Progress Notes (Signed)
Patient 46 year old gravida 5 para 4 Ab1 who was seen in the office on November 15 see previous encounter note. Patient had a ParaGard T380A IUD placed in July 2012 and had been complaining of irregular bleeding since then. An ultrasound done in the office on that day demonstrated the following:   The ultrasound demonstrated a normal-size uterus measuring 6.6 x 5.8 x 4.3 cm with an endometrial stripe of 3.5 mm. The IUD was found to be in the right position located in the intrauterine cavity. She had a thin-walled echo-free cyst avascular measuring 36 x 22 x 29 mm. There was also adjacent to that a thick wall tubular serpentine shaped tube measuring 61 x 10 x 32 mm (images reviewed measurements documented) consistent with hydrosalpinx. The left ovary was normal and there was no fluid in the cul-de-sac.  Patient returned today for followup ultrasound she states her bleeding has improved now she had been given a prescription of Megace initially to stop her bleeding. A prescription for Lysteda was prescribed her to take 650 mg (2 tablets 3 times a day for 5 days when necessary menorrhagia).  Ultrasound today demonstrated the uterus measuring 8.7 x 5.5 x 3.9 cm endometrial stripe 2.9 mm. IUD normal position. Right ovarian follicle 25 x 16 x 13 mm and once again the tubular serpentine mass with internal low level echoes measuring 6.9 x 1.0 cm folding on itself and avascular consistent with hydrosalpinx essentially unchanged from previous scan November 2012. Small left ovarian follicle was noted. Previous right ovarian cystic mass resolved and not seen.  Patient was informed complete resolution of the right ovarian cyst. The hydrosalpinx can be monitor unless she becomes symptomatic and we could proceed with a laparoscopic approach to have it removed. Patient was shown pictures an explanation provided and questions answered. She will continue to monitor her cycles a used to [place] as needed. She does have a  ParaGard T380A IUD that was placed in July of 2012. She is otherwise scheduled to return a few months for her annual exam or when necessary.

## 2011-11-24 ENCOUNTER — Other Ambulatory Visit: Payer: Self-pay | Admitting: Internal Medicine

## 2011-12-18 ENCOUNTER — Encounter: Payer: BC Managed Care – PPO | Admitting: *Deleted

## 2011-12-26 ENCOUNTER — Encounter: Payer: Self-pay | Admitting: *Deleted

## 2012-02-09 ENCOUNTER — Other Ambulatory Visit: Payer: Self-pay | Admitting: Internal Medicine

## 2012-02-10 ENCOUNTER — Other Ambulatory Visit: Payer: Self-pay | Admitting: Cardiology

## 2012-02-10 DIAGNOSIS — I4581 Long QT syndrome: Secondary | ICD-10-CM

## 2012-02-10 MED ORDER — METOPROLOL SUCCINATE ER 50 MG PO TB24: 50.0000 mg | ORAL_TABLET | Freq: Every day | ORAL | Status: DC

## 2012-02-26 ENCOUNTER — Telehealth: Payer: Self-pay | Admitting: Internal Medicine

## 2012-02-26 NOTE — Telephone Encounter (Addendum)
02-26-12 lmm @ 450pm for pt to set up appt for pacemaker ck in august with clinic/mt 06-10-12 sent past due letter certified per kristen/mt

## 2012-03-31 ENCOUNTER — Encounter: Payer: Self-pay | Admitting: *Deleted

## 2012-06-08 ENCOUNTER — Encounter: Payer: Self-pay | Admitting: *Deleted

## 2012-06-21 ENCOUNTER — Observation Stay (HOSPITAL_COMMUNITY)
Admission: EM | Admit: 2012-06-21 | Discharge: 2012-06-23 | Disposition: A | Discharge status: Home / Self Care | Payer: BC Managed Care – PPO | Attending: Cardiology | Admitting: Cardiology

## 2012-06-21 ENCOUNTER — Encounter (HOSPITAL_COMMUNITY): Payer: Self-pay | Admitting: Adult Health

## 2012-06-21 DIAGNOSIS — I472 Ventricular tachycardia, unspecified: Principal | ICD-10-CM | POA: Insufficient documentation

## 2012-06-21 DIAGNOSIS — Z4502 Encounter for adjustment and management of automatic implantable cardiac defibrillator: Secondary | ICD-10-CM

## 2012-06-21 DIAGNOSIS — I4581 Long QT syndrome: Secondary | ICD-10-CM | POA: Insufficient documentation

## 2012-06-21 DIAGNOSIS — Z95810 Presence of automatic (implantable) cardiac defibrillator: Secondary | ICD-10-CM | POA: Insufficient documentation

## 2012-06-21 DIAGNOSIS — I4729 Other ventricular tachycardia: Principal | ICD-10-CM | POA: Insufficient documentation

## 2012-06-21 LAB — CBC WITH DIFFERENTIAL/PLATELET
Eosinophils Absolute: 0.1 10*3/uL (ref 0.0–0.7)
Hemoglobin: 15.6 g/dL — ABNORMAL HIGH (ref 12.0–15.0)
Lymphocytes Relative: 38 % (ref 12–46)
Lymphs Abs: 1.9 10*3/uL (ref 0.7–4.0)
MCH: 32.6 pg (ref 26.0–34.0)
Monocytes Relative: 9 % (ref 3–12)
Neutro Abs: 2.7 10*3/uL (ref 1.7–7.7)
Neutrophils Relative %: 52 % (ref 43–77)
Platelets: 246 10*3/uL (ref 150–400)
RBC: 4.79 MIL/uL (ref 3.87–5.11)
WBC: 5.1 10*3/uL (ref 4.0–10.5)

## 2012-06-21 LAB — BASIC METABOLIC PANEL
BUN: 12 mg/dL (ref 6–23)
CO2: 29 mEq/L (ref 19–32)
Chloride: 103 mEq/L (ref 96–112)
GFR calc non Af Amer: >90 mL/min (ref >90)
Glucose, Bld: 108 mg/dL — ABNORMAL HIGH (ref 70–99)
Potassium: 4.2 mEq/L (ref 3.5–5.1)
Sodium: 142 mEq/L (ref 135–145)

## 2012-06-21 NOTE — ED Notes (Signed)
Presents with defibrillator firing 3 times tonight approx 40 minutes ago,  after a stressful event, now having light headedness. Pt has pacemaker/defibrillator by [facility].

## 2012-06-21 NOTE — ED Provider Notes (Addendum)
46 year old female with implanted defibrillator for long QT syndrome at the defibrillator go off 3 times tonight. Interrogation of the defibrillator to assure that the shocks were for appropriate tachyarrhythmias. She currently is resting comfortably and in no acute distress. Cardiology has been consulted and agrees to admit the patient.   Date: 06/21/2012  Rate: 86  Rhythm: normal sinus rhythm  QRS Axis: left  Intervals: QT prolonged  ST/T Wave abnormalities: normal  Conduction Disutrbances:none  Narrative Interpretation: Right bundle branch block, left axis deviation, prolonged QT interval. No prior ECG available for comparison.  Old EKG Reviewed: none available  I saw and evaluated the patient, reviewed the resident's note and I agree with the findings and plan.   Dione Booze, MD 06/21/12 2138  Dione Booze, MD 06/22/12 [phone]

## 2012-06-21 NOTE — ED Provider Notes (Signed)
History     CSN: [id number]  Arrival date & time 06/21/12  2010   First MD Initiated Contact with Patient 06/21/12 2037      Chief Complaint  Patient presents with  . AICD Problem    (Consider location/radiation/quality/duration/timing/severity/associated sxs/prior treatment) HPI Comments: The patient presents after her defibrillator discharge 3 times. This has happened before and this is similar to prior events. She states her triggers are usually hyperventilation from anxiety. Tonight she became anxious when she saw her cat Getting attacked by a dogs. She did not lose consciousness. she currently denies pain.  Patient is a 46 y.o. female presenting with palpitations. The history is provided by the patient. No language interpreter was used.  Palpitations  This is a recurrent problem. The current episode started 1 to 2 hours ago. The problem occurs rarely. The problem has been resolved. The problem is associated with anxiety, stress and hyperventilation. Associated symptoms include irregular heartbeat. Pertinent negatives include no fever, no chest pain, no syncope, no abdominal pain, no nausea, no vomiting, no headaches, no back pain, no weakness, no cough and no shortness of breath. She has tried nothing for the symptoms. The treatment provided no relief. Risk factors: Long QT.    Past Medical History  Diagnosis Date  . Ventricular fibrillation     LONG QT/IMPLANTABLE DEBRIBILATOR  . IUD     PARAGUARD IUD INSERTED 02-13-11.    Past Surgical History  Procedure Date  . Pacemaker insertion   . Tympanostomy tube placement   . Dilation and curettage of uterus 1999  . Cesarean section 1994  . Adenoidectomy   . Intrauterine device insertion February 13, 2011    PARAGUARD     Family History  Problem Relation Age of Onset  . Hypertension Father   . Heart disease Father     PACEMAKER INSERTED  . Diabetes Maternal Grandmother   . Cancer Maternal Grandmother     COLON CANCER     History  Substance Use Topics  . Smoking status: Never Smoker   . Smokeless tobacco: Never Used  . Alcohol Use: Yes     Comment: occasionally    OB History    Grav Para Term Preterm Abortions TAB SAB Ect Mult Living   5 4   1  1   4       Review of Systems  Constitutional: Negative for fever, chills, activity change and appetite change.  HENT: Negative for congestion, rhinorrhea, neck pain, neck stiffness and sinus pressure.   Eyes: Negative for discharge and visual disturbance.  Respiratory: Negative for cough, chest tightness, shortness of breath, wheezing and stridor.   Cardiovascular: Positive for palpitations. Negative for chest pain, leg swelling and syncope.  Gastrointestinal: Negative for nausea, vomiting, abdominal pain, diarrhea and abdominal distention.  Genitourinary: Negative for decreased urine volume and difficulty urinating.  Musculoskeletal: Negative for back pain and arthralgias.  Skin: Negative for color change and pallor.  Neurological: Negative for weakness, light-headedness and headaches.  Psychiatric/Behavioral: Negative for behavioral problems and agitation. The patient is nervous/anxious.   All other systems reviewed and are negative.    Allergies  Review of patient's allergies indicates no known allergies.  Home Medications   Current Outpatient Rx  Name  Route  Sig  Dispense  Refill  . METOPROLOL SUCCINATE ER 50 MG PO TB24      TAKE ONE TABLET BY MOUTH EVERY DAY   90 tablet   2   . METOPROLOL SUCCINATE ER 50 MG  PO TB24   Oral   Take 1 tablet (50 mg total) by mouth daily.   30 tablet   6   . MEXILETINE HCL 150 MG PO CAPS      TAKE 1 CAPSULE BY MOUTH TWICE DAILY   60 capsule   10   . POTASSIUM CHLORIDE CRYS ER 20 MEQ PO TBCR               . POTASSIUM CHLORIDE CRYS ER 20 MEQ PO TBCR      TAKE 2 TABLETS BY MOUTH TWICE DAILY   360 tablet   2     BP 121/77  Pulse 81  Temp 98.7 F (37.1 C) (Oral)  Resp 17  Ht 5\' 4"   (1.626 m)  Wt 135 lb (61.236 kg)  BMI 23.17 kg/m2  SpO2 100%  Physical Exam  Nursing note and vitals reviewed. Constitutional: She is oriented to person, place, and time. She appears well-developed and well-nourished. No distress.  HENT:  Head: Normocephalic and atraumatic.  Mouth/Throat: No oropharyngeal exudate.  Eyes: EOM are normal. Pupils are equal, round, and reactive to light. Right eye exhibits no discharge. Left eye exhibits no discharge.  Neck: Normal range of motion. Neck supple. No JVD present.  Cardiovascular: Normal rate, regular rhythm and normal heart sounds.   Pulmonary/Chest: Effort normal and breath sounds normal. No stridor. No respiratory distress. She has no wheezes. She has no rales. She exhibits no tenderness.  Abdominal: Soft. Bowel sounds are normal. She exhibits no distension. There is no tenderness. There is no guarding.  Musculoskeletal: Normal range of motion. She exhibits no edema and no tenderness.  Neurological: She is alert and oriented to person, place, and time. No cranial nerve deficit. She exhibits normal muscle tone.  Skin: Skin is warm and dry. No rash noted. She is not diaphoretic.  Psychiatric: She has a normal mood and affect. Her behavior is normal. Judgment and thought content normal.    ED Course  Procedures (including critical care time)  Labs Reviewed  CBC WITH DIFFERENTIAL - Abnormal; Notable for the following:    Hemoglobin 15.6 (*)     All other components within normal limits  BASIC METABOLIC PANEL - Abnormal; Notable for the following:    Glucose, Bld 108 (*)     All other components within normal limits  MAGNESIUM   No results found.   1. Long QT syndrome   2. Defibrillator discharge       MDM  11:25 PM awaiting admission to cardiology for automatic internal defibrillator firing x3 secondary to ventricular tachycardia. She never lost consciousness. Her potassium and magnesium are normal. She is currently asymptomatic  other than feeling mildly jittery.  Admitted in stable condition      Warrick Parisian, MD 06/21/12 2330

## 2012-06-21 NOTE — H&P (Signed)
History and Physical  Patient ID: [name] MRN: [id number], SOB: [birth year] 46 y.o. Date of Encounter: 06/21/2012, 10:59 PM  Primary Physician: Sheila Oats, MD Primary Cardiologist: Dr. Lewayne Bunting  Chief Complaint: ICD shock x 3  HPI: 46 y.o. female w/ PMHx significant for long Qt with history of vfib arrest s/p ICD placement who presented to [facility] on 06/21/2012 with complaints of ICD shock x 3. She was in her usual state of health today until this evening when she witnessed her dog and her daughter's cat get into a fight in the backyard. She describes it as extremely stressful and she reports hyperventilating. During the cat-dog fight, she suddenly felt her device go off 3 times. No loss of consciousness which has typically been how her episodes of appropriate device therapy have been. Afterwards, she calmed down and came to the ER for further assistance.  She denies any new medications, new OTC meds, herbal remedies etc. She is quite confident that it was the panic attack that brought on the shock. Denies any unusual chest pain, CHF symptoms or palpitations. No change in her medications though she missed her morning mexiltene.  She has been on her current regimen for over 1 year. Prior shocks have been attributed to hypokalemia.  EKG revealed NSR with RBBB. QTC is prolonged at ~550 ms. No EKG available for comparison. CXR pending.  Transtelephonic interrogation reveals nl operation. Shocked for cycle length of 220 ms. Appears to be sudden-on phenomenom. Followup shocks less clear.   Past Medical History  Diagnosis Date  . Ventricular fibrillation     LONG QT/IMPLANTABLE DEBRIBILATOR  . IUD     PARAGUARD IUD INSERTED 02-13-11.     Surgical History:  Past Surgical History  Procedure Date  . Pacemaker insertion   . Tympanostomy tube placement   . Dilation and curettage of uterus 1999  . Cesarean section 1994  . Adenoidectomy   . Intrauterine device  insertion February 13, 2011    PARAGUARD      Home Meds: Prior to Admission medications   Medication Sig Start Date End Date Taking? Authorizing Provider  metoprolol succinate (TOPROL-XL) 50 MG 24 hr tablet TAKE ONE TABLET BY MOUTH EVERY DAY 02/09/12   Marinus Maw, MD  metoprolol succinate (TOPROL-XL) 50 MG 24 hr tablet Take 1 tablet (50 mg total) by mouth daily. 02/10/12   Marinus Maw, MD  mexiletine (MEXITIL) 150 MG capsule TAKE 1 CAPSULE BY MOUTH TWICE DAILY 11/24/11   Marinus Maw, MD  potassium chloride SA (K-DUR,KLOR-CON) 20 MEQ tablet  12/16/10   Marinus Maw, MD  potassium chloride SA (K-DUR,KLOR-CON) 20 MEQ tablet TAKE 2 TABLETS BY MOUTH TWICE DAILY 11/24/11   Marinus Maw, MD    Allergies: No Known Allergies  History   Social History  . Marital Status: Married    Spouse Name: N/A    Number of Children: N/A  . Years of Education: N/A   Occupational History  . Not on file.   Social History Main Topics  . Smoking status: Never Smoker   . Smokeless tobacco: Never Used  . Alcohol Use: Yes     Comment: occasionally  . Drug Use: No  . Sexually Active: Yes -- Female partner(s)    Birth Control/ Protection: IUD   Other Topics Concern  . Not on file   Social History Narrative  . No narrative on file     Family History  Problem Relation Age of Onset  .  Hypertension Father   . Heart disease Father     PACEMAKER INSERTED  . Diabetes Maternal Grandmother   . Cancer Maternal Grandmother     COLON CANCER    Review of Systems: General: negative for chills, fever, night sweats or weight changes.  Cardiovascular: negative for chest pain, shortness of breath, dyspnea on exertion, edema, orthopnea, palpitations, paroxysmal nocturnal dyspnea  Dermatological: negative for rash Respiratory: negative for cough or wheezing Urologic: negative for hematuria Abdominal: negative for nausea, vomiting, diarrhea, bright red blood per rectum, melena, or hematemesis Neurologic:  negative for visual changes, weakness All other systems reviewed and are otherwise negative except as noted above.  Labs:   Lab Results  Component Value Date   WBC 5.1 06/21/2012   HGB 15.6* 06/21/2012   HCT 45.2 06/21/2012   MCV 94.4 06/21/2012   PLT 246 06/21/2012    Lab 06/21/12 2054  NA 142  K 4.2  CL 103  CO2 29  BUN 12  CREATININE 0.78  CALCIUM 9.9  PROT --  BILITOT --  ALKPHOS --  ALT --  AST --  GLUCOSE 108*    Radiology/Studies:  Cxray pending.   EKG: see HPI.  Physical Exam: Blood pressure 123/75, pulse 73, temperature 98.7 F (37.1 C), temperature source Oral, resp. rate 16, height 5\' 4"  (1.626 m), weight 61.236 kg (135 lb), SpO2 100.00%. General: Well developed, well nourished, in no acute distress. Head: Normocephalic, atraumatic, sclera non-icteric, nares are without discharge Neck: Supple. Negative for carotid bruits. JVD not elevated. Lungs: Clear bilaterally to auscultation without wheezes, rales, or rhonchi. Breathing is unlabored. Heart: RRR with S1 S2. No murmurs, rubs, or gallops appreciated. Abdomen: Soft, non-tender, non-distended with normoactive bowel sounds. No rebound/guarding. No obvious abdominal masses. Msk:  Strength and tone appear normal for age. Extremities: No edema. No clubbing or cyanosis. Distal pedal pulses are 2+ and equal bilaterally. Neuro: Alert and oriented X 3. Moves all extremities spontaneously. Psych:  Responds to questions appropriately with a normal affect.   Problem List 1. ICD shock x 3, presumably due to Vtach 2. RBBB, ?new diagnosis 3. H/o of Long QT with QT currently present  ASSESSMENT AND PLAN:  46 y.o. female w/ PMHx significant for long Qt with history of vfib arrest s/p ICD placement who presented to [facility] on 06/21/2012 with complaints of ICD shock x 3.   It appears at least the initial shock was appropriate with a "sudden on" heart rate of ~260 bpm which would be difficult for sinus  rhythm. Interestingly, the triggering event for the VT appears to be strong emotional stress from witnessing her animals fighting. No new meds, no infection, no electrolyte disturbances.  The RBBB appears to be new (no comparison EKGs, did not see this mentioned in recent notes). QT is also prolonged, unsure what her baseline is. Will defer to primary cardiologist regarding continuation of mexiletine, will hold for now.   Patient desires discharge but I think the most prudent approach would be to keep overnight in obs and re-assess in the morning regarding her regimen.  Continue BB and potassium. Monitor on telemetry. Repeat 12 lead in the AM. Interrogation on the chart. Cxray pending to ensure no lead complications though this is unlikely given normal interrgoation.   Signed, Adolm Joseph, Troy Sine MD 06/21/2012, 10:59 PM

## 2012-06-22 ENCOUNTER — Encounter (HOSPITAL_COMMUNITY): Payer: Self-pay | Admitting: *Deleted

## 2012-06-22 ENCOUNTER — Observation Stay (HOSPITAL_COMMUNITY): Payer: BC Managed Care – PPO

## 2012-06-22 DIAGNOSIS — I4581 Long QT syndrome: Secondary | ICD-10-CM

## 2012-06-22 LAB — BASIC METABOLIC PANEL
BUN: 14 mg/dL (ref 6–23)
CO2: 24 mEq/L (ref 19–32)
Chloride: 107 mEq/L (ref 96–112)
GFR calc Af Amer: >90 mL/min (ref >90)
Potassium: 3.5 mEq/L (ref 3.5–5.1)

## 2012-06-22 MED ORDER — POTASSIUM CHLORIDE CRYS ER 20 MEQ PO TBCR
40.0000 meq | EXTENDED_RELEASE_TABLET | Freq: Two times a day (BID) | ORAL | Status: DC
  Administered 2012-06-22 – 2012-06-23 (×3): 40 meq via ORAL
  Filled 2012-06-22 (×4): qty 2

## 2012-06-22 MED ORDER — SODIUM CHLORIDE 0.9 % IJ SOLN
3.0000 mL | Freq: Two times a day (BID) | INTRAMUSCULAR | Status: DC
  Administered 2012-06-22 (×2): 3 mL via INTRAVENOUS

## 2012-06-22 MED ORDER — METOPROLOL SUCCINATE ER 25 MG PO TB24
25.0000 mg | ORAL_TABLET | Freq: Every day | ORAL | Status: DC
  Filled 2012-06-22 (×2): qty 1

## 2012-06-22 MED ORDER — MEXILETINE HCL 150 MG PO CAPS
150.0000 mg | ORAL_CAPSULE | Freq: Two times a day (BID) | ORAL | Status: DC
  Administered 2012-06-22 – 2012-06-23 (×2): 150 mg via ORAL
  Filled 2012-06-22 (×3): qty 1

## 2012-06-22 NOTE — Progress Notes (Signed)
   ELECTROPHYSIOLOGY ROUNDING NOTE    Patient Name: [name] Date of Encounter: 06-22-2012    SUBJECTIVE:Patient feels well.  No chest pain or shortness of breath.  Admitted last evening after receiving 3 shocks from ICD.  This occurred in the setting of high emotional stress when her daughter's cat was being attacked by a dog.  Her lab data has been unremarkable. (Repeat BMET pending this morning).   Her Mexilitine was held last night because of concerns of prolonged QT.   TELEMETRY: Reviewed telemetry pt in sinus bradycardia Filed Vitals:   06/21/12 2200 06/21/12 2300 06/22/12 0058 06/22/12 0500  BP: 126/76 98/72 112/68 94/57  Pulse: 73 63 64 55  Temp:   98.4 F (36.9 C) 97.8 F (36.6 C)  TempSrc:   Oral Oral  Resp: 17 15 16 16   Height:   5\' 4"  (1.626 m)   Weight:   136 lb 12.8 oz (62.052 kg)   SpO2: 99% 99% 95% 96%   Well appearing NAD HEENT: Unremarkable Neck:  No JVD, no thyromegally Lungs:  Clear with no wheezes HEART:  Regular rate rhythm, no murmurs, no rubs, no clicks Abd:  Flat, positive bowel sounds, no organomegally, no rebound, no guarding Ext:  2 plus pulses, no edema, no cyanosis, no clubbing Skin:  No rashes no nodules Neuro:  CN II through XII intact, motor grossly intact    LABS: Basic Metabolic Panel:  Basename 06/21/12 2054  NA 142  K 4.2  CL 103  CO2 29  GLUCOSE 108*  BUN 12  CREATININE 0.78  CALCIUM 9.9  MG 2.1  PHOS --   CBC:  Basename 06/21/12 2054  WBC 5.1  NEUTROABS 2.7  HGB 15.6*  HCT 45.2  MCV 94.4  PLT 246    Radiology/Studies:  Pending   DEVICE INTERROGATION: [employer] Express transmission done in the ER last night shows an episode of FVT with a cycle length of that was treated with a 35J shock.  This resulted in a slow return to sinus at which point the patient received another 35J shock which resulted in return to VT.  She then received a third 35J shock which restored sinus rhythm.   Patient  programmed as a VF only device with a rate of 200.  NID 30/40 with redetect 9/12.  With patient's device, the first shock is non-committed, however, once re-detection is met for subsequent shocks, those are committed.  This is why she received second shock even though she had returned to sinus at the time the shock was delivered.   Tele - nsr  A/P 1. Recurrent VT - I suspect a massive catecholamine rush is responsible for her VT. I have recommended observation today and likely discharge tomorrow. Will restart her Mexitil.   Leonia Reeves.D.

## 2012-06-22 NOTE — Plan of Care (Signed)
Problem: Consults Goal: ICD Patient Education (See Patient Education module for education specifics.) Outcome: Progressing Observation for ICD firing x 3 PTA. Patient states emotional distress was possible trigger for VT event. Work-related stressors also identified. Education pending MD plan of care/new medication orders/etc.

## 2012-06-23 NOTE — Progress Notes (Signed)
Pt educated and informed on D/C information. Pt understands which medications to keep taking and when to call MD. Pt has no further questions or concerns. Prepared for D/C with daughter.

## 2012-06-23 NOTE — Discharge Summary (Signed)
Discharge Summary   Patient ID: [name] MRN: [id number], DOB/AGE: 1966-05-04 46 y.o.  Primary Cardiologist: Dr. Ladona Ridgel Admit date: 06/21/2012 D/C date:     06/23/2012     Primary Discharge Diagnoses:  1. Ventricular Tachycardia w/ successful ICD shock  - Suspected due to massive cathecholamine rush in the setting of emotional stress  - No further VT during observation, dc'd on previous home meds  Secondary Discharge Diagnoses:  Past Medical History  Diagnosis Date  . Ventricular fibrillation     LONG QT/IMPLANTABLE DEBRIBILATOR  . IUD     PARAGUARD IUD INSERTED 02-13-11.  . ICD (implantable cardiac defibrillator) in place      Allergies No Known Allergies  Diagnostic Studies/Procedures:   06/22/2012 - CHEST - 2 VIEW Findings: No active infiltrate or effusion is seen.  The defibrillator lead appears unchanged in position.  The heart is within upper limits of normal.  There is a thoracolumbar scoliosis again noted.  IMPRESSION: No apparent change in position of the defibrillator lead.  No active lung disease.       History of Present Illness: 46 y.o. female w/ the above medical problems who presented to [facility] on 06/21/12 after her ICD fired x3. She witnessed a fight between her dog and daughter's cat during which she was extremely stressed and hyperventilating with subsequent ICD shock x3. She did not lose consciousness.  Hospital Course: In the ED, EKG revealed NSR w/ RBBB, QTC ~550 ms. CXR was without acute cardiopulmonary abnormalities. Labs were significant for unremarkable CBC/BMET. She was placed in observation for further evaluation. She was evaluated by Dr. Ladona Ridgel who noted her [employer] Express transmission showed an episode of FVT with a cycle length of that was treated with a 35J shock. This resulted in a slow return to sinus at which point the patient received another 35J shock which resulted in return to VT. She then received a third 35J  shock which restored sinus rhythm. It was felt the VT was from a massive catecholamine rush in the setting of emotional stress. She was continued on home meds, including mexitil. She had no further arrhythmias on telemetry and was feeling well. She was seen and evaluated by Dr. Ladona Ridgel who felt she was stable for discharge home with plans for follow up as scheduled below.  Discharge Vitals: Blood pressure 102/63, pulse 47, temperature 97.5 F (36.4 C), temperature source Oral, resp. rate 16, height 5\' 4"  (1.626 m), weight 136 lb 12.8 oz (62.052 kg), last menstrual period 06/17/2012, SpO2 96.00%.  Labs:  Component Value Date   WBC 5.1 06/21/2012   HGB 15.6* 06/21/2012   HCT 45.2 06/21/2012   MCV 94.4 06/21/2012   PLT 246 06/21/2012    Lab 06/22/12 0533  NA 144  K 3.5  CL 107  CO2 24  BUN 14  CREATININE 0.74  CALCIUM 9.1  GLUCOSE 99    Discharge Medications     Medication List     As of 06/23/2012 12:24 PM    TAKE these medications         ibuprofen 200 MG tablet   Commonly known as: ADVIL,MOTRIN   Take 200 mg by mouth every 6 (six) hours as needed. For pain      metoprolol succinate 50 MG 24 hr tablet   Commonly known as: TOPROL-XL   Take 25 mg by mouth daily. Take with or immediately following a meal.      mexiletine 150 MG capsule  Commonly known as: MEXITIL   TAKE 1 CAPSULE BY MOUTH TWICE DAILY      potassium chloride SA 20 MEQ tablet   Commonly known as: K-DUR,KLOR-CON   Take 40 mEq by mouth 2 (two) times daily.      tranexamic acid 650 MG Tabs   Commonly known as: LYSTEDA   Take 1,300 mg by mouth See admin instructions. Patient takes 1300 mg three times daily for 5 days during menstrual cycle         Disposition   Discharge Orders    Future Appointments: Provider: Department: Dept Phone: Center:   07/21/2012 10:00 AM Marinus Maw, MD [facility] Main Office [place]) [phone] [id number]     Future Orders Please Complete By Expires    Diet - low sodium heart healthy      Increase activity slowly        Follow-up Information    Follow up with Lewayne Bunting, MD. On 07/21/2012. (10:00)    Contact information:   [facility] [address] [place] [address] [phone]           Outstanding Labs/Studies:  None  Duration of Discharge Encounter: Greater than 30 minutes including physician and PA time.  Signed, Lonnie Reth PA-C 06/23/2012, 12:24 PM

## 2012-06-23 NOTE — Progress Notes (Signed)
   ELECTROPHYSIOLOGY ROUNDING NOTE    Patient Name: [name] Date of Encounter: 06-23-2012    SUBJECTIVE:Patient feels well.  No chest pain or shortness of breath. No further arrhythmias.  Mexilitine resumed yesterday (held on admission).  Potassium 3.5 today.   TELEMETRY: Reviewed telemetry pt in sinus rhythm/sinus brady Filed Vitals:   06/22/12 0500 06/22/12 1400 06/22/12 2300 06/23/12 0500  BP: 94/57 101/66 99/65 100/64  Pulse: 55 55 64 44  Temp: 97.8 F (36.6 C) 98.3 F (36.8 C) 98.4 F (36.9 C) 97.5 F (36.4 C)  TempSrc: Oral     Resp: 16 18 18 16   Height:      Weight:      SpO2: 96% 94% 96% 96%    Intake/Output Summary (Last 24 hours) at 06/23/12 0733 Last data filed at 06/22/12 0918  Gross per 24 hour  Intake    240 ml  Output      0 ml  Net    240 ml    LABS: Basic Metabolic Panel:  Basename 06/22/12 0533 06/21/12 2054  NA 144 142  K 3.5 4.2  CL 107 103  CO2 24 29  GLUCOSE 99 108*  BUN 14 12  CREATININE 0.74 0.78  CALCIUM 9.1 9.9  MG -- 2.1  PHOS -- --   CBC:  Basename 06/21/12 2054  WBC 5.1  NEUTROABS 2.7  HGB 15.6*  HCT 45.2  MCV 94.4  PLT 246    Radiology/Studies: X-ray Chest Pa And Lateral  06/22/2012  *RADIOLOGY REPORT*  Clinical Data: Defibrillator fired three times yesterday  CHEST - 2 VIEW  Comparison: Portable chest x-ray of 10/29/1999  Findings: No active infiltrate or effusion is seen.  The defibrillator lead appears unchanged in position.  The heart is within upper limits of normal.  There is a thoracolumbar scoliosis again noted.  IMPRESSION: No apparent change in position of the defibrillator lead.  No active lung disease.   Original Report Authenticated By: Dwyane Dee, M.D.    EP Attending  Patient seen and examined. She is stable on telemetry and ready for discharge. Ok to [employer] home on home meds. No change. I would like to see her back in 3-4 weeks.  Leonia Reeves.D.

## 2012-06-23 NOTE — Progress Notes (Signed)
Spoke with [facility] [place] about holding metoprolol and mexiletine for HR of 47 and QT of 0.48. Received orders to hold metoprolol, but administer mexiletine. Will continue to monitor pt.

## 2012-07-15 ENCOUNTER — Encounter: Payer: Self-pay | Admitting: *Deleted

## 2012-07-21 ENCOUNTER — Ambulatory Visit (INDEPENDENT_AMBULATORY_CARE_PROVIDER_SITE_OTHER): Payer: BC Managed Care – PPO | Admitting: Internal Medicine

## 2012-07-21 ENCOUNTER — Encounter: Payer: Self-pay | Admitting: Internal Medicine

## 2012-07-21 VITALS — BP 108/77 | HR 58 | Ht 64.0 in | Wt 142.0 lb

## 2012-07-21 DIAGNOSIS — I10 Essential (primary) hypertension: Secondary | ICD-10-CM

## 2012-07-21 DIAGNOSIS — I469 Cardiac arrest, cause unspecified: Secondary | ICD-10-CM

## 2012-07-21 DIAGNOSIS — Z95810 Presence of automatic (implantable) cardiac defibrillator: Secondary | ICD-10-CM

## 2012-07-21 NOTE — Patient Instructions (Signed)
Remote monitoring is used to monitor your Pacemaker of ICD from home. This monitoring reduces the number of office visits required to check your device to one time per year. It allows us to keep an eye on the functioning of your device to ensure it is working properly. You are scheduled for a device check from home on October 25, 2012. You may send your transmission at any time that day. If you have a wireless device, the transmission will be sent automatically. After your physician reviews your transmission, you will receive a postcard with your next transmission date.  Your physician wants you to follow-up in: 1 year with Dr Taylor.  You will receive a reminder letter in the mail two months in advance. If you don't receive a letter, please call our office to schedule the follow-up appointment.  

## 2012-07-21 NOTE — Assessment & Plan Note (Signed)
Her blood pressure is well controlled. No change in medical therapy. 

## 2012-07-21 NOTE — Assessment & Plan Note (Signed)
Her Medtronic ICD is working normally. We'll plan to recheck in several months. 

## 2012-07-21 NOTE — Progress Notes (Signed)
HPI [name] returns today for followup. 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 Her actual arrhythmia diagnosis is still uncertain. She was in her usual state of health until several weeks ago when she became quite upset when her dog and cat were fighting. She separately received an ICD shock which was due to ventricular tachycardia. Since then she has been stable. No recurrent arrhythmias. She did have bradycardia in the hospital on telemetry which has been asymptomatic. No Known Allergies   Current Outpatient Prescriptions  Medication Sig Dispense Refill  . ibuprofen (ADVIL,MOTRIN) 200 MG tablet Take 200 mg by mouth every 6 (six) hours as needed. For pain      . metoprolol succinate (TOPROL-XL) 50 MG 24 hr tablet Take 25 mg by mouth daily. Take with or immediately following a meal.      . mexiletine (MEXITIL) 150 MG capsule TAKE 1 CAPSULE BY MOUTH TWICE DAILY  60 capsule  10  . potassium chloride SA (K-DUR,KLOR-CON) 20 MEQ tablet Take 40 mEq by mouth 2 (two) times daily.      . tranexamic acid (LYSTEDA) 650 MG TABS Take 1,300 mg by mouth See admin instructions. Patient takes 1300 mg three times daily for 5 days during menstrual cycle         Past Medical History  Diagnosis Date  . Ventricular fibrillation     LONG QT/IMPLANTABLE DEBRIBILATOR  . IUD     PARAGUARD IUD INSERTED 02-13-11.  . ICD (implantable cardiac defibrillator) in place     ROS:   All systems reviewed and negative except as noted in the HPI.   Past Surgical History  Procedure Date  . Pacemaker insertion   . Tympanostomy tube placement   . Dilation and curettage of uterus 1999  . Cesarean section 1994  . Adenoidectomy   . Intrauterine device insertion February 13, 2011    PARAGUARD      Family History  Problem Relation Age of Onset  . Hypertension Father   . Heart disease Father     PACEMAKER INSERTED  . Diabetes  Maternal Grandmother   . Cancer Maternal Grandmother     COLON CANCER     History   Social History  . Marital Status: Married    Spouse Name: N/A    Number of Children: N/A  . Years of Education: N/A   Occupational History  . Not on file.   Social History Main Topics  . Smoking status: Never Smoker   . Smokeless tobacco: Never Used  . Alcohol Use: Yes     Comment: occasionally  . Drug Use: No  . Sexually Active: Yes -- Female partner(s)    Birth Control/ Protection: IUD   Other Topics Concern  . Not on file   Social History Narrative  . No narrative on file     BP 108/77  Pulse 58  Ht 5\' 4"  (1.626 m)  Wt 142 lb (64.411 kg)  BMI 24.37 kg/m2  Physical Exam:  Well appearing middle-aged woman, NAD HEENT: Unremarkable Neck:  no JVD, no thyromegally Lungs:  Clear with no wheezes, rales, or rhonchi. HEART:  Regular rate rhythm, no murmurs, no rubs, no clicks Abd:  soft, positive bowel sounds, no organomegally, no rebound, no guarding Ext:  2 plus pulses, no edema, no cyanosis, no clubbing Skin:  No rashes no nodules Neuro:  CN II through XII intact, motor grossly intact  EKG sinus  bradycardia with right bundle branch block  DEVICE  Normal device function.  See PaceArt for details.   Assess/Plan:

## 2012-08-18 ENCOUNTER — Other Ambulatory Visit: Payer: Self-pay

## 2012-08-18 MED ORDER — TRANEXAMIC ACID 650 MG PO TABS: 1300.0000 mg | ORAL_TABLET | ORAL | Status: DC

## 2012-09-02 ENCOUNTER — Ambulatory Visit (INDEPENDENT_AMBULATORY_CARE_PROVIDER_SITE_OTHER): Payer: BC Managed Care – PPO | Admitting: Gynecology

## 2012-09-02 ENCOUNTER — Encounter: Payer: Self-pay | Admitting: Gynecology

## 2012-09-02 VITALS — BP 130/82 | Ht 63.5 in | Wt 140.0 lb

## 2012-09-02 DIAGNOSIS — N7011 Chronic salpingitis: Secondary | ICD-10-CM

## 2012-09-02 DIAGNOSIS — Z23 Encounter for immunization: Secondary | ICD-10-CM

## 2012-09-02 DIAGNOSIS — Z01419 Encounter for gynecological examination (general) (routine) without abnormal findings: Secondary | ICD-10-CM

## 2012-09-02 DIAGNOSIS — N7013 Chronic salpingitis and oophoritis: Secondary | ICD-10-CM

## 2012-09-02 LAB — CBC WITH DIFFERENTIAL/PLATELET
Basophils Relative: 0 % (ref 0–1)
Eosinophils Relative: 1 % (ref 0–5)
HCT: 42.4 % (ref 36.0–46.0)
Hemoglobin: 14 g/dL (ref 12.0–15.0)
Lymphocytes Relative: 34 % (ref 12–46)
MCHC: 33 g/dL (ref 30.0–36.0)
MCV: 96.8 fL (ref 78.0–100.0)
Monocytes Absolute: 0.5 10*3/uL (ref 0.1–1.0)
Monocytes Relative: 9 % (ref 3–12)
Neutro Abs: 3.1 10*3/uL (ref 1.7–7.7)

## 2012-09-02 NOTE — Progress Notes (Addendum)
[name] 03/02/1966 [id number]   History:    47 y.o.  for annual gyn exam with no complaints today. Review of her record indicated that patient has a history of ventricular fibrillation, patient has a pacemaker(implantable defibrillator) in place as a result of her long QT interval and has been under the care of her cardiologist Dr. Ladona Ridgel. She follows up with her cardiologist every 6 months. Patient had a ParaGard T380A IUD placed in 2012. She has suffer from menorrhagia in the past and responded to Lysteda 650 mg 2 tablets by mouth 3 times a day for 5 days of her cycle. Patient's last mammogram was in 2012 reported to be normal. Patient states she does her monthly self breast examination. She has a history of prior right breast fine-needle aspiration for breast cyst. Patient's last Pap smear 2012. Patient denies any prior history of abnormal Pap smears. Patient's mother with history of osteoporosis. Patient states she is taking her calcium and vitamin D. Patient declined flu vaccine but is interested in the Tdap vaccine.  Past medical history,surgical history, family history and social history were all reviewed and documented in the EPIC chart.  Gynecologic History Patient's last menstrual period was 08/19/2012. Contraception: IUD Last Pap: 2012. Results were: normal Last mammogram: 2012. Results were: normal  Obstetric History OB History    Grav Para Term Preterm Abortions TAB SAB Ect Mult Living   5 4   1  1   4      # Outc Date GA Lbr Len/2nd Wgt Sex Del Anes PTL Lv   1 PAR            2 PAR            3 PAR            4 PAR            5 SAB                ROS: A ROS was performed and pertinent positives and negatives are included in the history.  GENERAL: No fevers or chills. HEENT: No change in vision, no earache, sore throat or sinus congestion. NECK: No pain or stiffness. CARDIOVASCULAR: No chest pain or pressure. No palpitations. PULMONARY: No shortness of breath, cough or  wheeze. GASTROINTESTINAL: No abdominal pain, nausea, vomiting or diarrhea, melena or bright red blood per rectum. GENITOURINARY: No urinary frequency, urgency, hesitancy or dysuria. MUSCULOSKELETAL: No joint or muscle pain, no back pain, no recent trauma. DERMATOLOGIC: No rash, no itching, no lesions. ENDOCRINE: No polyuria, polydipsia, no heat or cold intolerance. No recent change in weight. HEMATOLOGICAL: No anemia or easy bruising or bleeding. NEUROLOGIC: No headache, seizures, numbness, tingling or weakness. PSYCHIATRIC: No depression, no loss of interest in normal activity or change in sleep pattern.     Exam: chaperone present  BP 130/82  Ht 5' 3.5" (1.613 m)  Wt 140 lb (63.504 kg)  BMI 24.41 kg/m2  LMP 08/19/2012  Body mass index is 24.41 kg/(m^2).  General appearance : Well developed well nourished female. No acute distress HEENT: Neck supple, trachea midline, no carotid bruits, no thyroidmegaly Lungs: Clear to auscultation, no rhonchi or wheezes, or rib retractions  Heart: Regular rate and rhythm, no murmurs or gallops Breast:Examined in sitting and supine position were symmetrical in appearance, no palpable masses or tenderness,  no skin retraction, no nipple inversion, no nipple discharge, no skin discoloration, no axillary or supraclavicular lymphadenopathy Abdomen: no palpable masses or tenderness, no  rebound or guarding Extremities: no edema or skin discoloration or tenderness  Pelvic:  Bartholin, Urethra, Skene Glands: Within normal limits             Vagina: No gross lesions or discharge, IUD string seen  Cervix: No gross lesions or discharge  Uterus  anteverted, normal size, shape and consistency, non-tender and mobile  Adnexa  Without masses or tenderness  Anus and perineum  normal   Rectovaginal  normal sphincter tone without palpated masses or tenderness             Hemoccult not indicated     Assessment/Plan:  47 y.o. female for annual exam was reminded to  schedule her mammogram. We discussed importance of calcium and vitamin D for osteoporosis prevention along with weightbearing exercises to 3 times a week. She was counseled and received the Tdap vaccine today. The following lab work today: CBC, cholesterol, hemoglobin A1c, TSH and urinalysis. No Pap smear done today. The new screening guidelines discussed. Review of patient's records indicated that her ultrasound in February 2013 had demonstrated a right ovarian follicle measuring 25 x 16 x 13 mm with a tubular serpentine mass with internal low level echoes measured 6.9 x 1.0 cm folding  on itself and avascular consistent with a hydrosalpinx. Her left ovary had a small follicle measuring 16 x 18 mm. We will have the patient scheduled followup ultrasound the next one to two weeks.    Ok Edwards MD, 10:01 PM 09/02/2012

## 2012-09-02 NOTE — Patient Instructions (Addendum)

## 2012-09-03 ENCOUNTER — Telehealth: Payer: Self-pay

## 2012-09-03 LAB — URINALYSIS W MICROSCOPIC + REFLEX CULTURE
Crystals: NONE SEEN
Leukocytes, UA: NEGATIVE
Nitrite: NEGATIVE
Specific Gravity, Urine: 1.006 (ref 1.005–1.030)
Squamous Epithelial / LPF: NONE SEEN
Urobilinogen, UA: 0.2 mg/dL (ref 0.0–1.0)
pH: 5 (ref 5.0–8.0)

## 2012-09-03 LAB — HEMOGLOBIN A1C
Hgb A1c MFr Bld: 5.4 % (ref <5.7)
Mean Plasma Glucose: 108 mg/dL (ref <117)

## 2012-09-03 NOTE — Telephone Encounter (Signed)
Per Dr. Glenetta Hew I called patient. [name] or [name] please contact us patient and tell her that she left yesterday and I had forgotten to remind her to schedule her followup ultrasound with me in one to 2 weeks to followup on her previous right hydrosalpinx (slightly enlarged right fallopian tube seen an ultrasound last year which is a benign process) that I would like to compare with previous year. Thank you   Patient was informed and scheduled.

## 2012-09-14 ENCOUNTER — Encounter: Payer: Self-pay | Admitting: Gynecology

## 2012-09-15 ENCOUNTER — Other Ambulatory Visit: Payer: BC Managed Care – PPO

## 2012-09-15 ENCOUNTER — Ambulatory Visit: Payer: BC Managed Care – PPO | Admitting: Gynecology

## 2012-09-24 ENCOUNTER — Ambulatory Visit (INDEPENDENT_AMBULATORY_CARE_PROVIDER_SITE_OTHER): Payer: BC Managed Care – PPO | Admitting: Gynecology

## 2012-09-24 ENCOUNTER — Ambulatory Visit (INDEPENDENT_AMBULATORY_CARE_PROVIDER_SITE_OTHER): Payer: BC Managed Care – PPO

## 2012-09-24 DIAGNOSIS — N7011 Chronic salpingitis: Secondary | ICD-10-CM

## 2012-09-24 DIAGNOSIS — N7013 Chronic salpingitis and oophoritis: Secondary | ICD-10-CM

## 2012-09-24 DIAGNOSIS — N7093 Salpingitis and oophoritis, unspecified: Secondary | ICD-10-CM

## 2012-09-24 NOTE — Progress Notes (Signed)
Patient presented to the office today for followup ultrasound. Patient was seen in the office January 30 of this year for her annual gynecological examination.Review of patient's records indicated that her ultrasound in February 2013 had demonstrated a right ovarian follicle measuring 25 x 16 x 13 mm with a tubular serpentine mass with internal low level echoes measured 6.9 x 1.0 cm folding on itself and avascular consistent with a hydrosalpinx. Her left ovary had a small follicle measuring 16 x 18 mm.patient was instructed to return today for followup ultrasound. Patient is otherwise doing well and has a ParaGard T380A IUD. Her recent labs were normal as well.  Ultrasound today: Uterus measures 7.9 x by 5.6 x 4.2 cm with endometrial stripe of 4.3 mm. Uterus is retroverted. IUD was seen in the endometrial cavity. Left ovary normal with small 18 mm follicle. Right ovary was normal with 20 mm follicle. A right adnexal hydrosalpinx measuring 45 x 40 mm was noted which appears to have decreased in size from scan 1 year ago. There was no fluid in the cul-de-sac.  Assessment/plan right hydrosalpinx decreased in size from 2013. Patient asymptomatic. We'll continue to monitor with yearly ultrasounds. Patient to return back to the office in one year or when necessary.

## 2012-10-25 ENCOUNTER — Encounter: Payer: BC Managed Care – PPO | Admitting: *Deleted

## 2012-11-04 ENCOUNTER — Encounter: Payer: Self-pay | Admitting: *Deleted

## 2012-12-15 ENCOUNTER — Ambulatory Visit (INDEPENDENT_AMBULATORY_CARE_PROVIDER_SITE_OTHER): Payer: BC Managed Care – PPO | Admitting: *Deleted

## 2012-12-15 ENCOUNTER — Encounter: Payer: Self-pay | Admitting: Internal Medicine

## 2012-12-15 DIAGNOSIS — Z95810 Presence of automatic (implantable) cardiac defibrillator: Secondary | ICD-10-CM

## 2012-12-15 DIAGNOSIS — I469 Cardiac arrest, cause unspecified: Secondary | ICD-10-CM

## 2012-12-21 LAB — REMOTE ICD DEVICE
BRDY-0002RV: 40 {beats}/min
CHARGE TIME: 7.317 s
DEV-0020ICD: NEGATIVE
RV LEAD AMPLITUDE: 1.8 mv
RV LEAD IMPEDENCE ICD: 532 Ohm
TOT-0001: 17
TOT-0002: 1
TZAT-0001FASTVT: 1
TZAT-0001SLOWVT: 1
TZAT-0002FASTVT: NEGATIVE
TZAT-0018SLOWVT: NEGATIVE
TZON-0003VSLOWVT: 450 ms
TZON-0004SLOWVT: 16
TZON-0004VSLOWVT: 32
TZON-0005SLOWVT: 12
TZST-0001FASTVT: 3
TZST-0001FASTVT: 4
TZST-0001FASTVT: 5
TZST-0001SLOWVT: 2
TZST-0001SLOWVT: 3
TZST-0001SLOWVT: 4
TZST-0001SLOWVT: 6
TZST-0002FASTVT: NEGATIVE
TZST-0002FASTVT: NEGATIVE
TZST-0002FASTVT: NEGATIVE
TZST-0002FASTVT: NEGATIVE
TZST-0002SLOWVT: NEGATIVE
TZST-0002SLOWVT: NEGATIVE

## 2012-12-23 ENCOUNTER — Encounter: Payer: Self-pay | Admitting: *Deleted

## 2013-01-07 ENCOUNTER — Other Ambulatory Visit: Payer: Self-pay | Admitting: Internal Medicine

## 2013-02-11 ENCOUNTER — Telehealth: Payer: Self-pay | Admitting: *Deleted

## 2013-02-11 NOTE — Telephone Encounter (Signed)
Pt sent manual remote, all diagnostics clear. Pt having L wrist pain and discomfort at device pocket but no chest tightness, palpitations, or SOB. Pt believes cause originating from exercise but wanted to verify with device clinic. Pt's tone was inquisitive & without anxiety. I instructed her to go to ER if chest tightness, palpitations, or SOB occurs.

## 2013-03-02 ENCOUNTER — Other Ambulatory Visit: Payer: Self-pay | Admitting: Internal Medicine

## 2013-03-03 ENCOUNTER — Other Ambulatory Visit: Payer: Self-pay | Admitting: Internal Medicine

## 2013-03-28 ENCOUNTER — Encounter: Payer: BC Managed Care – PPO | Admitting: *Deleted

## 2013-03-31 ENCOUNTER — Encounter: Payer: Self-pay | Admitting: *Deleted

## 2013-06-21 ENCOUNTER — Other Ambulatory Visit: Payer: Self-pay | Admitting: Gynecology

## 2013-06-21 ENCOUNTER — Telehealth: Payer: Self-pay | Admitting: *Deleted

## 2013-06-21 ENCOUNTER — Ambulatory Visit
Admission: RE | Admit: 2013-06-21 | Discharge: 2013-06-21 | Disposition: A | Discharge status: Home / Self Care | Payer: BC Managed Care – PPO | Source: Ambulatory Visit | Attending: Gynecology | Admitting: Gynecology

## 2013-06-21 ENCOUNTER — Encounter: Payer: Self-pay | Admitting: Gynecology

## 2013-06-21 ENCOUNTER — Ambulatory Visit (INDEPENDENT_AMBULATORY_CARE_PROVIDER_SITE_OTHER): Payer: BC Managed Care – PPO | Admitting: Gynecology

## 2013-06-21 VITALS — BP 116/78

## 2013-06-21 DIAGNOSIS — N6310 Unspecified lump in the right breast, unspecified quadrant: Secondary | ICD-10-CM

## 2013-06-21 DIAGNOSIS — N63 Unspecified lump in unspecified breast: Secondary | ICD-10-CM

## 2013-06-21 NOTE — Telephone Encounter (Signed)
Called breast center and pt was told to come over now to have exam. Pt informed order placed

## 2013-06-21 NOTE — Telephone Encounter (Signed)
Message copied by Aura Camps on Tue Jun 21, 2013 12:46 PM ------      Message from: Ok Edwards      Created: Tue Jun 21, 2013 12:32 PM       Victorino Dike please schedule diagnostic mammogram and ultrasound on this patient with a 6 cm diameter right breast mass that is very painful. ------

## 2013-06-21 NOTE — Progress Notes (Signed)
47 year old who presented to the office today complaining of a one-week history of breast mass and tenderness. Patient denies any recent trauma or injury. Patient has not had a mammogram in several years.   Exam: Both breasts were examined sitting supine position. Right breast was asymmetrical in comparison to the left breast. There was no skin discoloration or no skin dimpling no nipple inversion. There was no supraclavicular or axillary lymphadenopathy. Contralateral breast no palpable masses subdermal pacemaker was noted. A 6 cm in diameter mass was noted on the right breast from the cervix to the 12:00 position incorporating the areolar region:  Physical Exam  Pulmonary/Chest:     Assessment/plan: Patient with larger right breast mass will be referred to the radiologist for diagnostic mammogram and ultrasound and if indeed cystic to proceed with fine-needle aspiration.

## 2013-08-11 ENCOUNTER — Encounter: Payer: Self-pay | Admitting: *Deleted

## 2013-08-22 ENCOUNTER — Other Ambulatory Visit: Payer: Self-pay | Admitting: Internal Medicine

## 2013-08-23 ENCOUNTER — Other Ambulatory Visit: Payer: Self-pay | Admitting: Internal Medicine

## 2013-08-23 ENCOUNTER — Other Ambulatory Visit: Payer: Self-pay

## 2013-08-23 MED ORDER — POTASSIUM CHLORIDE CRYS ER 20 MEQ PO TBCR: EXTENDED_RELEASE_TABLET | ORAL | Status: DC

## 2013-08-29 ENCOUNTER — Other Ambulatory Visit: Payer: Self-pay | Admitting: Gynecology

## 2013-08-29 NOTE — Telephone Encounter (Signed)
In your 09/02/2012 CE note you wrote "She has suffer from menorrhagia in the past and responded to Lysteda 650 mg 2 tablets by mouth 3 times a day for 5 days of her cycle."

## 2013-09-06 ENCOUNTER — Encounter: Payer: Self-pay | Admitting: Internal Medicine

## 2013-09-06 ENCOUNTER — Encounter: Payer: Self-pay | Admitting: *Deleted

## 2013-09-06 ENCOUNTER — Ambulatory Visit (INDEPENDENT_AMBULATORY_CARE_PROVIDER_SITE_OTHER): Payer: BC Managed Care – PPO | Admitting: Internal Medicine

## 2013-09-06 VITALS — BP 122/78 | HR 52 | Ht 64.0 in | Wt 151.8 lb

## 2013-09-06 DIAGNOSIS — I4581 Long QT syndrome: Secondary | ICD-10-CM

## 2013-09-06 DIAGNOSIS — Z95810 Presence of automatic (implantable) cardiac defibrillator: Secondary | ICD-10-CM

## 2013-09-06 DIAGNOSIS — I10 Essential (primary) hypertension: Secondary | ICD-10-CM

## 2013-09-06 DIAGNOSIS — I469 Cardiac arrest, cause unspecified: Secondary | ICD-10-CM

## 2013-09-06 LAB — MDC_IDC_ENUM_SESS_TYPE_INCLINIC
Battery Voltage: 2.62 V
Brady Statistic RV Percent Paced: 4.12 %
Date Time Interrogation Session: 20150203110000
HIGH POWER IMPEDANCE MEASURED VALUE: 45 Ohm
HighPow Impedance: 57 Ohm
Lead Channel Impedance Value: 437 Ohm
Lead Channel Setting Pacing Amplitude: 2.5 V
Lead Channel Setting Pacing Pulse Width: 0.4 ms
MDC IDC MSMT LEADCHNL RV SENSING INTR AMPL: 6.5 mV
MDC IDC MSMT LEADCHNL RV SENSING INTR AMPL: 7.125 mV
MDC IDC SET LEADCHNL RV SENSING SENSITIVITY: 0.3 mV
MDC IDC SET ZONE DETECTION INTERVAL: 300 ms
Zone Setting Detection Interval: 360 ms
Zone Setting Detection Interval: 450 ms

## 2013-09-06 NOTE — Patient Instructions (Signed)
See instruction sheet for generator change  

## 2013-09-06 NOTE — Assessment & Plan Note (Signed)
Her blood pressure remains well controlled.

## 2013-09-06 NOTE — Assessment & Plan Note (Signed)
Her medtronic ICD is working normally. She is approaching ERI. Will plan to change out her device in the next month or two, as her schedule allows. She will let us know when she would like to undergo device generator change out.

## 2013-09-06 NOTE — Addendum Note (Signed)
Addended by: Dennis Bast F on: 09/06/2013 10:33 AM   Modules accepted: Orders

## 2013-09-06 NOTE — Progress Notes (Signed)
HPI [name] returns today for followup. 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 Her actual arrhythmia diagnosis is still uncertain. She has had no recurrent arrhythmias.  She has had no chest pain, sob, or syncope No Known Allergies   Current Outpatient Prescriptions  Medication Sig Dispense Refill  . ibuprofen (ADVIL,MOTRIN) 200 MG tablet Take 200 mg by mouth every 6 (six) hours as needed. For pain      . metoprolol succinate (TOPROL-XL) 50 MG 24 hr tablet TAKE 1 TABLET BY MOUTH DAILY  90 tablet  0  . mexiletine (MEXITIL) 150 MG capsule TAKE 1 CAPSULE BY MOUTH TWICE DAILY  60 capsule  10  . potassium chloride SA (K-DUR,KLOR-CON) 20 MEQ tablet TAKE 2 TABLETS BY MOUTH TWICE DAILY  360 tablet  0  . tranexamic acid (LYSTEDA) 650 MG TABS tablet TAKE 2 TABLETS BY MOUTH THREE TIMES DAILY FOR 5 DAYS DURING MENSTRUAL CYCLE  30 tablet  5   No current facility-administered medications for this visit.     Past Medical History  Diagnosis Date  . Ventricular fibrillation     LONG QT/IMPLANTABLE DEBRIBILATOR  . IUD     PARAGUARD IUD INSERTED 02-13-11.  . ICD (implantable cardiac defibrillator) in place     ROS:   All systems reviewed and negative except as noted in the HPI.   Past Surgical History  Procedure Laterality Date  . Pacemaker insertion    . Tympanostomy tube placement    . Dilation and curettage of uterus  1999  . Cesarean section  1994  . Adenoidectomy    . Intrauterine device insertion  February 13, 2011    PARAGUARD      Family History  Problem Relation Age of Onset  . Hypertension Father   . Heart disease Father     PACEMAKER INSERTED  . Diabetes Maternal Grandmother   . Cancer Maternal Grandmother     COLON CANCER     History   Social History  . Marital Status: Married    Spouse Name: N/A    Number of Children: N/A  . Years of Education: N/A   Occupational History   . Not on file.   Social History Main Topics  . Smoking status: Never Smoker   . Smokeless tobacco: Never Used  . Alcohol Use: Yes     Comment: occasionally  . Drug Use: No  . Sexual Activity: Yes    Partners: Male    Birth Control/ Protection: IUD     Comment: PARAGARD   Other Topics Concern  . Not on file   Social History Narrative  . No narrative on file     BP 122/78  Pulse 52  Ht 5\' 4"  (1.626 m)  Wt 151 lb 12.8 oz (68.856 kg)  BMI 26.04 kg/m2  Physical Exam:  Well appearing middle-aged woman, NAD HEENT: Unremarkable Neck:  no JVD, no thyromegally Lungs:  Clear with no wheezes, rales, or rhonchi. HEART:  Regular rate rhythm, no murmurs, no rubs, no clicks Abd:  soft, positive bowel sounds, no organomegally, no rebound, no guarding Ext:  2 plus pulses, no edema, no cyanosis, no clubbing Skin:  No rashes no nodules Neuro:  CN II through XII intact, motor grossly intact    DEVICE  Normal device function.  See PaceArt for details. She is approaching ERI  Assess/Plan:

## 2013-11-02 ENCOUNTER — Other Ambulatory Visit: Payer: Self-pay | Admitting: Internal Medicine

## 2013-11-14 ENCOUNTER — Encounter (HOSPITAL_COMMUNITY): Payer: Self-pay | Admitting: Pharmacist

## 2013-11-17 ENCOUNTER — Other Ambulatory Visit (INDEPENDENT_AMBULATORY_CARE_PROVIDER_SITE_OTHER): Payer: BC Managed Care – PPO

## 2013-11-17 DIAGNOSIS — Z95810 Presence of automatic (implantable) cardiac defibrillator: Secondary | ICD-10-CM

## 2013-11-17 DIAGNOSIS — I10 Essential (primary) hypertension: Secondary | ICD-10-CM

## 2013-11-17 MED ORDER — SODIUM CHLORIDE 0.9 % IR SOLN
80.0000 mg | Status: DC
  Filled 2013-11-17: qty 2

## 2013-11-17 MED ORDER — CEFAZOLIN SODIUM-DEXTROSE 2-3 GM-% IV SOLR: 2.0000 g | INTRAVENOUS | Status: DC

## 2013-11-18 ENCOUNTER — Ambulatory Visit (HOSPITAL_COMMUNITY): Payer: BC Managed Care – PPO

## 2013-11-18 ENCOUNTER — Encounter (HOSPITAL_COMMUNITY): Payer: Self-pay | Admitting: *Deleted

## 2013-11-18 ENCOUNTER — Ambulatory Visit (HOSPITAL_COMMUNITY)
Admission: RE | Admit: 2013-11-18 | Discharge: 2013-11-18 | Disposition: A | Discharge status: Home / Self Care | Payer: BC Managed Care – PPO | Source: Ambulatory Visit | Attending: Internal Medicine | Admitting: Internal Medicine

## 2013-11-18 ENCOUNTER — Encounter (HOSPITAL_COMMUNITY)
Admission: RE | Disposition: A | Discharge status: Home / Self Care | Payer: BC Managed Care – PPO | Source: Ambulatory Visit | Attending: Internal Medicine

## 2013-11-18 DIAGNOSIS — Z95810 Presence of automatic (implantable) cardiac defibrillator: Secondary | ICD-10-CM

## 2013-11-18 DIAGNOSIS — I469 Cardiac arrest, cause unspecified: Secondary | ICD-10-CM | POA: Diagnosis present

## 2013-11-18 DIAGNOSIS — I4901 Ventricular fibrillation: Secondary | ICD-10-CM

## 2013-11-18 DIAGNOSIS — Z4502 Encounter for adjustment and management of automatic implantable cardiac defibrillator: Secondary | ICD-10-CM | POA: Insufficient documentation

## 2013-11-18 DIAGNOSIS — I495 Sick sinus syndrome: Secondary | ICD-10-CM | POA: Insufficient documentation

## 2013-11-18 DIAGNOSIS — I4581 Long QT syndrome: Secondary | ICD-10-CM | POA: Insufficient documentation

## 2013-11-18 DIAGNOSIS — I10 Essential (primary) hypertension: Secondary | ICD-10-CM | POA: Insufficient documentation

## 2013-11-18 HISTORY — DX: Long QT syndrome: I45.81

## 2013-11-18 HISTORY — DX: Bradycardia, unspecified: R00.1

## 2013-11-18 HISTORY — PX: IMPLANTABLE CARDIOVERTER DEFIBRILLATOR (ICD) GENERATOR CHANGE: SHX5469

## 2013-11-18 LAB — BASIC METABOLIC PANEL
BUN: 17 mg/dL (ref 6–23)
BUN: 14 mg/dL (ref 6–23)
CALCIUM: 9.3 mg/dL (ref 8.4–10.5)
CHLORIDE: 105 meq/L (ref 96–112)
CO2: 29 meq/L (ref 19–32)
CO2: 24 meq/L (ref 19–32)
CREATININE: 0.74 mg/dL (ref 0.50–1.10)
Calcium: 9.8 mg/dL (ref 8.4–10.5)
Chloride: 103 mEq/L (ref 96–112)
Creatinine, Ser: 0.7 mg/dL (ref 0.4–1.2)
GFR calc Af Amer: >90 mL/min (ref >90)
GFR calc non Af Amer: >90 mL/min (ref >90)
GFR: 96.47 mL/min (ref >60.00)
GLUCOSE: 84 mg/dL (ref 70–99)
GLUCOSE: 93 mg/dL (ref 70–99)
POTASSIUM: 4.4 meq/L (ref 3.5–5.1)
Potassium: 4 mEq/L (ref 3.7–5.3)
Sodium: 141 mEq/L (ref 135–145)
Sodium: 141 mEq/L (ref 137–147)

## 2013-11-18 LAB — CBC WITH DIFFERENTIAL/PLATELET
Basophils Absolute: 0 10*3/uL (ref 0.0–0.1)
Basophils Relative: 0.5 % (ref 0.0–3.0)
EOS ABS: 0.1 10*3/uL (ref 0.0–0.7)
Eosinophils Relative: 0.9 % (ref 0.0–5.0)
HEMATOCRIT: 43.2 % (ref 36.0–46.0)
HEMOGLOBIN: 14.6 g/dL (ref 12.0–15.0)
Lymphocytes Relative: 23.6 % (ref 12.0–46.0)
Lymphs Abs: 1.5 10*3/uL (ref 0.7–4.0)
MCHC: 33.9 g/dL (ref 30.0–36.0)
MCV: 96.2 fl (ref 78.0–100.0)
MONO ABS: 0.5 10*3/uL (ref 0.1–1.0)
Monocytes Relative: 7.8 % (ref 3.0–12.0)
NEUTROS ABS: 4.3 10*3/uL (ref 1.4–7.7)
Neutrophils Relative %: 67.2 % (ref 43.0–77.0)
Platelets: 229 10*3/uL (ref 150.0–400.0)
RBC: 4.49 Mil/uL (ref 3.87–5.11)
RDW: 12.4 % (ref 11.5–14.6)
WBC: 6.4 10*3/uL (ref 4.5–10.5)

## 2013-11-18 LAB — CBC
HEMATOCRIT: 42.6 % (ref 36.0–46.0)
Hemoglobin: 14.7 g/dL (ref 12.0–15.0)
MCH: 33 pg (ref 26.0–34.0)
MCHC: 34.5 g/dL (ref 30.0–36.0)
MCV: 95.7 fL (ref 78.0–100.0)
Platelets: 205 10*3/uL (ref 150–400)
RBC: 4.45 MIL/uL (ref 3.87–5.11)
RDW: 12.3 % (ref 11.5–15.5)
WBC: 6.3 10*3/uL (ref 4.0–10.5)

## 2013-11-18 LAB — PREGNANCY, URINE: Preg Test, Ur: NEGATIVE

## 2013-11-18 LAB — SURGICAL PCR SCREEN
MRSA, PCR: NEGATIVE
Staphylococcus aureus: NEGATIVE

## 2013-11-18 SURGERY — ICD GENERATOR CHANGE
Anesthesia: LOCAL

## 2013-11-18 MED ORDER — ONDANSETRON HCL 4 MG/2ML IJ SOLN: 4.0000 mg | Freq: Four times a day (QID) | INTRAMUSCULAR | Status: DC | PRN

## 2013-11-18 MED ORDER — CHLORHEXIDINE GLUCONATE 4 % EX LIQD: 60.0000 mL | Freq: Once | CUTANEOUS | Status: DC

## 2013-11-18 MED ORDER — MUPIROCIN 2 % EX OINT
TOPICAL_OINTMENT | Freq: Two times a day (BID) | CUTANEOUS | Status: DC
  Administered 2013-11-18: 1 via NASAL

## 2013-11-18 MED ORDER — SODIUM CHLORIDE 0.9 % IV SOLN: INTRAVENOUS | Status: DC

## 2013-11-18 MED ORDER — CEFAZOLIN SODIUM 1-5 GM-% IV SOLN
1.0000 g | Freq: Four times a day (QID) | INTRAVENOUS | Status: AC
Start: 2013-11-18 — End: 2013-11-18
  Administered 2013-11-18: 1 g via INTRAVENOUS
  Filled 2013-11-18: qty 50

## 2013-11-18 MED ORDER — HEPARIN (PORCINE) IN NACL 2-0.9 UNIT/ML-% IJ SOLN
INTRAMUSCULAR | Status: AC
  Filled 2013-11-18: qty 500

## 2013-11-18 MED ORDER — LIDOCAINE HCL (PF) 1 % IJ SOLN
INTRAMUSCULAR | Status: AC
  Filled 2013-11-18: qty 60

## 2013-11-18 MED ORDER — MUPIROCIN 2 % EX OINT
TOPICAL_OINTMENT | CUTANEOUS | Status: AC
  Filled 2013-11-18: qty 22

## 2013-11-18 MED ORDER — FENTANYL CITRATE 0.05 MG/ML IJ SOLN
INTRAMUSCULAR | Status: AC
  Filled 2013-11-18: qty 2

## 2013-11-18 MED ORDER — MIDAZOLAM HCL 5 MG/5ML IJ SOLN
INTRAMUSCULAR | Status: AC
  Filled 2013-11-18: qty 5

## 2013-11-18 MED ORDER — METOPROLOL SUCCINATE ER 50 MG PO TB24
50.0000 mg | ORAL_TABLET | Freq: Every day | ORAL | Status: DC
  Filled 2013-11-18: qty 1

## 2013-11-18 MED ORDER — TRANEXAMIC ACID 650 MG PO TABS
1300.0000 mg | ORAL_TABLET | Freq: Three times a day (TID) | ORAL | Status: DC | PRN
  Filled 2013-11-18: qty 2

## 2013-11-18 MED ORDER — POTASSIUM CHLORIDE CRYS ER 20 MEQ PO TBCR: 40.0000 meq | EXTENDED_RELEASE_TABLET | Freq: Two times a day (BID) | ORAL | Status: DC

## 2013-11-18 MED ORDER — MEXILETINE HCL 150 MG PO CAPS
150.0000 mg | ORAL_CAPSULE | Freq: Two times a day (BID) | ORAL | Status: DC
  Filled 2013-11-18 (×2): qty 1

## 2013-11-18 MED ORDER — ACETAMINOPHEN 325 MG PO TABS: 325.0000 mg | ORAL_TABLET | ORAL | Status: DC | PRN

## 2013-11-18 NOTE — H&P (Signed)
EP Pre-Op consult note     HPI [name] returns today for ICD generator change and insertion of an atrial lead. 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 Her actual arrhythmia diagnosis is still uncertain. She has had two distinct episodes of recurrent VF requiring recurrent ICD shocks. She has had no recurrent arrhythmias since I saw her last year.  She has had no chest pain, sob, or syncope. She has had bradycardia which is of uncertain clinical significance but concerning because it is so severe and because she is on and will need to stay on a beta blocker. No Known Allergies      Current Outpatient Prescriptions   Medication  Sig  Dispense  Refill   .  ibuprofen (ADVIL,MOTRIN) 200 MG tablet  Take 200 mg by mouth every 6 (six) hours as needed. For pain         .  metoprolol succinate (TOPROL-XL) 50 MG 24 hr tablet  TAKE 1 TABLET BY MOUTH DAILY   90 tablet   0   .  mexiletine (MEXITIL) 150 MG capsule  TAKE 1 CAPSULE BY MOUTH TWICE DAILY   60 capsule   10   .  potassium chloride SA (K-DUR,KLOR-CON) 20 MEQ tablet  TAKE 2 TABLETS BY MOUTH TWICE DAILY   360 tablet   0   .  tranexamic acid (LYSTEDA) 650 MG TABS tablet  TAKE 2 TABLETS BY MOUTH THREE TIMES DAILY FOR 5 DAYS DURING MENSTRUAL CYCLE   30 tablet   5       No current facility-administered medications for this visit.           Past Medical History   Diagnosis  Date   .  Ventricular fibrillation         LONG QT/IMPLANTABLE DEBRIBILATOR   .  IUD         PARAGUARD IUD INSERTED 02-13-11.   .  ICD (implantable cardiac defibrillator) in place          ROS:    All systems reviewed and negative except as noted in the HPI.      Past Surgical History   Procedure  Laterality  Date   .  Pacemaker insertion       .  Tympanostomy tube placement       .  Dilation and curettage of uterus    1999   .  Cesarean section    1994   .   Adenoidectomy       .  Intrauterine device insertion    February 13, 2011       PARAGUARD            Family History   Problem  Relation  Age of Onset   .  Hypertension  Father     .  Heart disease  Father         PACEMAKER INSERTED   .  Diabetes  Maternal Grandmother     .  Cancer  Maternal Grandmother         COLON CANCER           History       Social History   .  Marital Status:  Married       Spouse Name:  N/A       Number of Children:  N/A   .  Years of Education:  N/A  Occupational History   .  Not on file.       Social History Main Topics   .  Smoking status:  Never Smoker    .  Smokeless tobacco:  Never Used   .  Alcohol Use:  Yes         Comment: occasionally   .  Drug Use:  No   .  Sexual Activity:  Yes       Partners:  Male       Birth Control/ Protection:  IUD         Comment: PARAGARD       Other Topics  Concern   .  Not on file       Social History Narrative   .  No narrative on file          BP 122/78  Pulse 52  Ht 5\' 4"  (1.626 m)  Wt 151 lb 12.8 oz (68.856 kg)  BMI 26.04 kg/m2   Physical Exam:   Well appearing middle-aged woman, NAD HEENT: Unremarkable Neck:  no JVD, no thyromegally Lungs:  Clear with no wheezes, rales, or rhonchi. HEART:  Regular rate rhythm, no murmurs, no rubs, no clicks Abd:  soft, positive bowel sounds, no organomegally, no rebound, no guarding Ext:  2 plus pulses, no edema, no cyanosis, no clubbing Skin:  No rashes no nodules Neuro:  CN II through XII intact, motor grossly intact       DEVICE   Normal device function.  See PaceArt for details. She has reached ERI   Assess/Plan:           ICD-Medtronic - Marinus MawGregg W Ilir Mahrt, MD at 09/06/2013 10:28 AM    Status: Written Related Problem: ICD-Medtronic    Her medtronic ICD is working normally. She has reached ERI. Will plan to change out her device and insert a new atrial lead. I have discussed the risks/benefits/goals/expectations of the  procedure and she wishes to proceed.        ESSENTIAL HYPERTENSION, BENIGN - Marinus MawGregg W Alexxa Sabet, MD at 09/06/2013 10:28 AM    Status: Written Related Problem: ESSENTIAL HYPERTENSION, BENIGN    Her blood pressure remains well controlled.       Leonia ReevesGregg Autie Vasudevan,M.D.

## 2013-11-18 NOTE — Progress Notes (Signed)
Patient ID: [name], female   DOB: 1965-12-24, 47 y.o.   MRN: [id number] S/p ICD generator change with insertion of a new atrial lead. Ok for discharge. Will send a remote ICD check tomorrow from home. CXR without PTX. Incision without hematoma. Back up pacing set at 50/min with a long AV delay.  Leonia Reeves.D.

## 2013-11-18 NOTE — Progress Notes (Signed)
Orthopedic Tech Progress Note Patient Details:  GLORIANA LOTZ July 13, 1966 [id number] Spoke with nurse; patient has arm sling. No action needed from [employer]. Patient ID: PHALA VEIGEL, female   DOB: Nov 04, 1965, 47 y.o.   MRN: [id number]   Orie Rout 11/18/2013, 11:06 AM

## 2013-11-18 NOTE — CV Procedure (Signed)
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 After the usual preparation and draping, intravenous fentanyl and Versed were given for sedation. 30 cc of lidocaine was infiltrated into the left infraclavicular region, just above the old ICD insertion. A 5 cm incision was carried out and electrocautery was utilized to dissect down to the fascial plane. The left subclavian vein was punctured, and the Medtronic model 5076 active fixation pacing lead was advanced under fluoroscopic guidance into the right atrium. Mapping in the right atrium was carried out. At the final site, the P waves measured 3.5 mV. With the lead actively fixed, the pacing impedance was 640 ohms. A large injury current was present. The threshold was less than a fold at 0.5 ms. 10 V pacing did not stimulate the diaphragm. The lead was secured to the fascia with a silk suture. The sewing sleeve was secured with silk suture. Electrocautery was utilized to enter the ICD pocket. Dense fibrous adhesions were freed up with the electrocautery. The ICD was removed in total. The old defibrillator lead was disconnected from the ICD. The new Medtronic dual-chamber ICD, serial [id number] was connected to the old defibrillator lead and the new atrial lead, and placed back in the subcutaneous pocket after pacemaker pocket revision was carried out to accommodate the additional atrial lead and shape of the new device. The pocket was irrigated with antibiotic irrigation. Electrocautery was utilized to ensure hemostasis. The incision was closed with 2 layers of Vicryl suture. Benzoin and Steri-Strip her pain on the  skin. A pressure dressing was placed. The patient was returned to the recovery area in satisfactory condition.  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  Lewayne Bunting, M.D.

## 2013-11-18 NOTE — H&P (Signed)
  ICD Criteria  Current LVEF:55% ;Obtained > 6 months ago.   NYHA Functional Classification: Class I  Heart Failure History:  No.  Non-Ischemic Dilated Cardiomyopathy History:  No.  Atrial Fibrillation/Atrial Flutter:  No.  Ventricular Tachycardia History:  No.  Cardiac Arrest History:  Yes, This was a Ventricular Tachycardia/Ventricular Fibrillation Arrest. This was NOT a bradycardia arrest.  History of Syndromes with Risk of Sudden Death:  Yes, Long QT Syndrome  Previous ICD:  Yes, ICD Type:  Single, Reason for ICD:  Secondary, reason for secondary prevention:  Cardiac Arrest.  Electrophysiology Study: No.  Prior MI: No.  PPM: No.  OSA:  no  Patient Life Expectancy of >=1 year: Yes.  Anticoagulation Therapy:  Patient is NOT on anticoagulation therapy.   Beta Blocker Therapy:  Yes.   Ace Inhibitor/ARB Therapy:  No, Reason not on Ace Inhibitor/ARB therapy:  normal LV function, no heart failure

## 2013-11-18 NOTE — Discharge Instructions (Signed)
° ° °  Supplemental Discharge Instructions for  Pacemaker/Defibrillator Patients  Activity No heavy lifting or vigorous activity with your left/right arm for 6 to 8 weeks.  Do not raise your left/right arm above your head for one week.  Gradually raise your affected arm as drawn below.             11/21/13                         11/22/13                     11/23/13                   11/24/13  NO DRIVING for  1 week   WOUND CARE   Keep the wound area clean and dry.  Do not get this area wet for one week. No showers for one week; you may shower on  11/25/13   .   The tape/steri-strips on your wound will fall off; do not pull them off.  No bandage is needed on the site.  DO  NOT apply any creams, oils, or ointments to the wound area.   If you notice any drainage or discharge from the wound, any swelling or bruising at the site, or you develop a fever > 101? F after you are discharged home, call the office at once.  Special Instructions   You are still able to use cellular telephones; use the ear opposite the side where you have your pacemaker/defibrillator.  Avoid carrying your cellular phone near your device.   When traveling through airports, show security personnel your identification card to avoid being screened in the metal detectors.  Ask the security personnel to use the hand wand.   Avoid arc welding equipment, MRI testing (magnetic resonance imaging), TENS units (transcutaneous nerve stimulators).  Call the office for questions about other devices.   Avoid electrical appliances that are in poor condition or are not properly grounded.   Microwave ovens are safe to be near or to operate.  Additional information for defibrillator patients should your device go off:   If your device goes off ONCE and you feel fine afterward, notify the device clinic nurses.   If your device goes off ONCE and you do not feel well afterward, call 911.   If your device goes off TWICE, call 911.   If your device  goes off THREE times in one day, call 911.  DO NOT DRIVE YOURSELF OR A FAMILY MEMBER WITH A DEFIBRILLATOR TO THE HOSPITAL--CALL 911.

## 2013-11-30 ENCOUNTER — Ambulatory Visit: Payer: BC Managed Care – PPO

## 2013-12-01 ENCOUNTER — Ambulatory Visit (INDEPENDENT_AMBULATORY_CARE_PROVIDER_SITE_OTHER): Payer: BC Managed Care – PPO | Admitting: *Deleted

## 2013-12-01 DIAGNOSIS — I4581 Long QT syndrome: Secondary | ICD-10-CM

## 2013-12-01 DIAGNOSIS — I469 Cardiac arrest, cause unspecified: Secondary | ICD-10-CM

## 2013-12-01 DIAGNOSIS — Z95810 Presence of automatic (implantable) cardiac defibrillator: Secondary | ICD-10-CM

## 2013-12-01 LAB — MDC_IDC_ENUM_SESS_TYPE_INCLINIC
Brady Statistic AP VS Percent: 41.44 %
Brady Statistic AS VS Percent: 58.47 %
Brady Statistic RA Percent Paced: 41.51 %
Date Time Interrogation Session: 20150430112507
HIGH POWER IMPEDANCE MEASURED VALUE: 190 Ohm
HighPow Impedance: 45 Ohm
HighPow Impedance: 61 Ohm
Lead Channel Impedance Value: 418 Ohm
Lead Channel Impedance Value: 475 Ohm
Lead Channel Pacing Threshold Amplitude: 0.625 V
Lead Channel Pacing Threshold Amplitude: 0.875 V
Lead Channel Pacing Threshold Pulse Width: 0.4 ms
Lead Channel Pacing Threshold Pulse Width: 0.4 ms
Lead Channel Sensing Intrinsic Amplitude: 0.625 mV
Lead Channel Sensing Intrinsic Amplitude: 3.25 mV
Lead Channel Setting Pacing Pulse Width: 0.4 ms
MDC IDC MSMT BATTERY REMAINING LONGEVITY: 125 mo
MDC IDC MSMT BATTERY VOLTAGE: 3.09 V
MDC IDC MSMT LEADCHNL RV SENSING INTR AMPL: 6.25 mV
MDC IDC MSMT LEADCHNL RV SENSING INTR AMPL: 6.5 mV
MDC IDC SET LEADCHNL RA PACING AMPLITUDE: 3.5 V
MDC IDC SET LEADCHNL RV PACING AMPLITUDE: 2.5 V
MDC IDC SET LEADCHNL RV SENSING SENSITIVITY: 0.3 mV
MDC IDC SET ZONE DETECTION INTERVAL: 350 ms
MDC IDC SET ZONE DETECTION INTERVAL: 450 ms
MDC IDC STAT BRADY AP VP PERCENT: 0.06 %
MDC IDC STAT BRADY AS VP PERCENT: 0.03 %
MDC IDC STAT BRADY RV PERCENT PACED: 0.09 %
Zone Setting Detection Interval: 300 ms
Zone Setting Detection Interval: 360 ms

## 2013-12-01 NOTE — Progress Notes (Signed)
Wound check appointment for changeout & RA revision. Steri-strips removed. Wound without redness or edema. Incision edges approximated, wound well healed. Normal device function. Thresholds, sensing, and impedances consistent with implant measurements. Device programmed at 3.5V on RA lead for extra safety margin until 3 month visit; RV lead at chronic settings. Changed mode from DDD to MVP per implant note. Histogram distribution appropriate for patient and level of activity. No mode switches or ventricular arrhythmias noted. Patient educated about wound care, arm mobility, lifting restrictions, shock plan. ROV w/ GT 03/09/14 @ 9:15

## 2013-12-05 ENCOUNTER — Other Ambulatory Visit: Payer: Self-pay | Admitting: Internal Medicine

## 2013-12-20 ENCOUNTER — Encounter: Payer: Self-pay | Admitting: Internal Medicine

## 2013-12-23 ENCOUNTER — Telehealth: Payer: Self-pay | Admitting: *Deleted

## 2013-12-23 ENCOUNTER — Telehealth: Payer: Self-pay | Admitting: Cardiology

## 2013-12-23 ENCOUNTER — Encounter: Payer: Self-pay | Admitting: Internal Medicine

## 2013-12-23 NOTE — Telephone Encounter (Signed)
Patient received shock for afib with RVR - she was rushing across the parking lot at the time.  No palpitations, pre-syncope, dizziness, or other symptoms.  Completely asymptomatic at this time. D/W Dr Ladona Ridgel - will have patient come in next week for office visit.  AF is a new diagnosis for her.  She is aware to call with further shocks this weekend.   Gypsy Balsam, RN, BSN 12/23/2013 7:05 PM

## 2013-12-23 NOTE — Telephone Encounter (Signed)
Pt called. Her ICD went off as she was running across a parking lot. She admits she forgot to take her K+ today. I suggested she take her K+ as well as an extra 1/2 Toprol 50 mg. If her ICD fires again she should come to the ER to have it interrogated.  Corine Shelter PA-C 12/23/2013 6:10 PM

## 2013-12-27 ENCOUNTER — Ambulatory Visit (INDEPENDENT_AMBULATORY_CARE_PROVIDER_SITE_OTHER): Payer: BC Managed Care – PPO | Admitting: Internal Medicine

## 2013-12-27 ENCOUNTER — Encounter: Payer: Self-pay | Admitting: Internal Medicine

## 2013-12-27 VITALS — BP 120/75 | HR 51 | Ht 64.0 in | Wt 147.2 lb

## 2013-12-27 DIAGNOSIS — Z95810 Presence of automatic (implantable) cardiac defibrillator: Secondary | ICD-10-CM

## 2013-12-27 DIAGNOSIS — I4581 Long QT syndrome: Secondary | ICD-10-CM

## 2013-12-27 DIAGNOSIS — I4891 Unspecified atrial fibrillation: Secondary | ICD-10-CM | POA: Insufficient documentation

## 2013-12-27 DIAGNOSIS — I10 Essential (primary) hypertension: Secondary | ICD-10-CM

## 2013-12-27 LAB — MDC_IDC_ENUM_SESS_TYPE_INCLINIC

## 2013-12-27 NOTE — Assessment & Plan Note (Signed)
This is a new problem. She has received an inapproriate ICD shock for atrial fib with a very rapid VR. I have asked the patient to increase her dose of beta blocker to 25/50 in the morning and evening respectively. We have reprogrammed her device to allow [place] to discriminate rapid atrial fib from VT/VF. Will follow.

## 2013-12-27 NOTE — Patient Instructions (Signed)
Your physician recommends that you schedule a follow-up appointment as scheduled  

## 2013-12-27 NOTE — Progress Notes (Signed)
HPI [name] returns today for followup. 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 Her actual arrhythmia diagnosis is still uncertain. She has had no recurrent arrhythmias.  She has undergone ICD generator change out and because of symptomatic sinus bradycardia, underwent insertion of a DDD device. She has been stable in the interim until several days ago when she experienced another ICD shock which appears to be due to atrial fib with an RVR.  She did not pass out before the episode as she has with prior primary VT/VF episodes. No other complaints. She denies dietary or medical non-compliance.  No Known Allergies   Current Outpatient Prescriptions  Medication Sig Dispense Refill  . ibuprofen (ADVIL,MOTRIN) 200 MG tablet Take 200 mg by mouth every 6 (six) hours as needed. For pain      . metoprolol succinate (TOPROL-XL) 50 MG 24 hr tablet Take 50 mg by mouth daily. Take with or immediately following a meal.      . mexiletine (MEXITIL) 150 MG capsule Take 150 mg by mouth 2 (two) times daily.      . potassium chloride SA (K-DUR,KLOR-CON) 20 MEQ tablet Take 40 mEq by mouth 2 (two) times daily.      . tranexamic acid (LYSTEDA) 650 MG TABS tablet Take 1,300 mg by mouth 3 (three) times daily as needed (x 5 days during menstrual cycle for excessive bleeding.).       No current facility-administered medications for this visit.     Past Medical History  Diagnosis Date  . Ventricular fibrillation   . Symptomatic bradycardia   . Long Q-T syndrome     ROS:   All systems reviewed and negative except as noted in the HPI.   Past Surgical History  Procedure Laterality Date  . Implantable cardioverter defibrillator implant  2009; 11-18-13    MDT single chamber ICD implanted by Dr Ladona Ridgelaylor 2009; upgrade to dual chamber MDT ICD 11/2013 by Dr Ladona Ridgelaylor  . Tympanostomy tube placement    . Dilation and curettage of  uterus  1999  . Cesarean section  1994  . Adenoidectomy    . Intrauterine device insertion  February 13, 2011    PARAGUARD      Family History  Problem Relation Age of Onset  . Hypertension Father   . Heart disease Father     PACEMAKER INSERTED  . Diabetes Maternal Grandmother   . Cancer Maternal Grandmother     COLON CANCER     History   Social History  . Marital Status: Married    Spouse Name: N/A    Number of Children: N/A  . Years of Education: N/A   Occupational History  . Not on file.   Social History Main Topics  . Smoking status: Never Smoker   . Smokeless tobacco: Never Used  . Alcohol Use: Yes     Comment: occasionally  . Drug Use: No  . Sexual Activity: Yes    Partners: Male    Birth Control/ Protection: IUD     Comment: PARAGARD   Other Topics Concern  . Not on file   Social History Narrative  . No narrative on file     BP 120/75  Pulse 51  Ht 5\' 4"  (1.626 m)  Wt 147 lb 3.2 oz (66.769 kg)  BMI 25.25 kg/m2  Physical Exam:  Well appearing middle-aged woman, NAD HEENT: Unremarkable Neck:  no JVD, no thyromegally Lungs:  Clear with no wheezes, rales, or rhonchi. Well healed ICD incision. HEART:  Regular rate rhythm, no murmurs, no rubs, no clicks Abd:  soft, positive bowel sounds, no organomegally, no rebound, no guarding Ext:  2 plus pulses, no edema, no cyanosis, no clubbing Skin:  No rashes no nodules Neuro:  CN II through XII intact, motor grossly intact  ECG - NSR with RBBB  DEVICE  Normal device function.  See PaceArt for details.  Assess/Plan:

## 2013-12-27 NOTE — Assessment & Plan Note (Signed)
ICD interogation demonstrates a normally functioning device and an episode of atrial fib with an RVR resulting in an ICD shock. No indication for anti-coagulation at this time.

## 2013-12-27 NOTE — Assessment & Plan Note (Signed)
Her blood pressure is well controlled today. Will follow.

## 2013-12-29 ENCOUNTER — Other Ambulatory Visit: Payer: Self-pay | Admitting: Internal Medicine

## 2014-01-26 ENCOUNTER — Other Ambulatory Visit: Payer: Self-pay | Admitting: Internal Medicine

## 2014-02-27 ENCOUNTER — Other Ambulatory Visit: Payer: Self-pay | Admitting: *Deleted

## 2014-02-27 ENCOUNTER — Other Ambulatory Visit: Payer: Self-pay | Admitting: Internal Medicine

## 2014-02-27 MED ORDER — POTASSIUM CHLORIDE CRYS ER 20 MEQ PO TBCR: 40.0000 meq | EXTENDED_RELEASE_TABLET | Freq: Two times a day (BID) | ORAL | Status: DC

## 2014-03-09 ENCOUNTER — Ambulatory Visit (INDEPENDENT_AMBULATORY_CARE_PROVIDER_SITE_OTHER): Payer: BC Managed Care – PPO | Admitting: Internal Medicine

## 2014-03-09 ENCOUNTER — Encounter: Payer: Self-pay | Admitting: Internal Medicine

## 2014-03-09 VITALS — BP 95/67 | HR 50 | Ht 64.0 in | Wt 135.8 lb

## 2014-03-09 DIAGNOSIS — I480 Paroxysmal atrial fibrillation: Secondary | ICD-10-CM

## 2014-03-09 DIAGNOSIS — I4581 Long QT syndrome: Secondary | ICD-10-CM

## 2014-03-09 DIAGNOSIS — Z95810 Presence of automatic (implantable) cardiac defibrillator: Secondary | ICD-10-CM

## 2014-03-09 DIAGNOSIS — I4891 Unspecified atrial fibrillation: Secondary | ICD-10-CM

## 2014-03-09 DIAGNOSIS — I469 Cardiac arrest, cause unspecified: Secondary | ICD-10-CM

## 2014-03-09 LAB — MDC_IDC_ENUM_SESS_TYPE_INCLINIC
Battery Remaining Longevity: 125 mo
Battery Voltage: 3.06 V
Brady Statistic RA Percent Paced: 41.88 %
Date Time Interrogation Session: 20150806094143
HIGH POWER IMPEDANCE MEASURED VALUE: 171 Ohm
HighPow Impedance: 48 Ohm
HighPow Impedance: 67 Ohm
Lead Channel Pacing Threshold Amplitude: 0.5 V
Lead Channel Pacing Threshold Amplitude: 1 V
Lead Channel Pacing Threshold Pulse Width: 0.4 ms
Lead Channel Sensing Intrinsic Amplitude: 1 mV
Lead Channel Sensing Intrinsic Amplitude: 7.875 mV
Lead Channel Setting Pacing Pulse Width: 0.4 ms
MDC IDC MSMT LEADCHNL RA IMPEDANCE VALUE: 532 Ohm
MDC IDC MSMT LEADCHNL RV IMPEDANCE VALUE: 456 Ohm
MDC IDC MSMT LEADCHNL RV PACING THRESHOLD PULSEWIDTH: 0.4 ms
MDC IDC SET LEADCHNL RA PACING AMPLITUDE: 2 V
MDC IDC SET LEADCHNL RV PACING AMPLITUDE: 2.5 V
MDC IDC SET LEADCHNL RV SENSING SENSITIVITY: 0.3 mV
MDC IDC SET ZONE DETECTION INTERVAL: 450 ms
MDC IDC STAT BRADY AP VP PERCENT: 0.03 %
MDC IDC STAT BRADY AP VS PERCENT: 41.85 %
MDC IDC STAT BRADY AS VP PERCENT: 0.02 %
MDC IDC STAT BRADY AS VS PERCENT: 58.1 %
MDC IDC STAT BRADY RV PERCENT PACED: 0.05 %
Zone Setting Detection Interval: 250 ms
Zone Setting Detection Interval: 300 ms
Zone Setting Detection Interval: 350 ms

## 2014-03-09 MED ORDER — MEXILETINE HCL 150 MG PO CAPS: 150.0000 mg | ORAL_CAPSULE | Freq: Two times a day (BID) | ORAL | Status: DC

## 2014-03-09 MED ORDER — POTASSIUM CHLORIDE CRYS ER 20 MEQ PO TBCR: EXTENDED_RELEASE_TABLET | ORAL | Status: DC

## 2014-03-09 NOTE — Patient Instructions (Signed)
Your physician wants you to follow-up in: April 2016 with Dr Court Joy will receive a reminder letter in the mail two months in advance. If you don't receive a letter, please call our office to schedule the follow-up appointment.    Remote monitoring is used to monitor your Pacemaker of ICD from home. This monitoring reduces the number of office visits required to check your device to one time per year. It allows [place] to keep an eye on the functioning of your device to ensure it is working properly. You are scheduled for a device check from home on 06/12/14. You may send your transmission at any time that day. If you have a wireless device, the transmission will be sent automatically. After your physician reviews your transmission, you will receive a postcard with your next transmission date.

## 2014-03-10 ENCOUNTER — Encounter: Payer: Self-pay | Admitting: Internal Medicine

## 2014-03-10 NOTE — Assessment & Plan Note (Signed)
She has had no recurrent VT/VF. She will continue her current meds including her beta blocker and Mexilitine.

## 2014-03-10 NOTE — Progress Notes (Signed)
HPI [name] returns today for followup. She is a pleasant 48 yo woman with a h/o VF, s/p ICD implant, as well as HTN and very brief episodes of atrial fibrillation. In the interim she has lost 18 lbs. She exercises regularly. She has had no recent ICD shocks. No Known Allergies   Current Outpatient Prescriptions  Medication Sig Dispense Refill  . diphenhydramine-acetaminophen (TYLENOL PM) 25-500 MG TABS Take 2 tablets by mouth at bedtime as needed.      Marland Kitchen ibuprofen (ADVIL,MOTRIN) 200 MG tablet Take 200 mg by mouth every 6 (six) hours as needed. For pain      . metoprolol succinate (TOPROL-XL) 50 MG 24 hr tablet Take 1/2 tablet in the morning and 1 tablet at night      . mexiletine (MEXITIL) 150 MG capsule Take 1 capsule (150 mg total) by mouth 2 (two) times daily.  180 capsule  3  . potassium chloride SA (K-DUR,KLOR-CON) 20 MEQ tablet TAKE 2 TABLETS BY MOUTH TWICE DAILY  360 tablet  3  . tranexamic acid (LYSTEDA) 650 MG TABS tablet Take 1,300 mg by mouth 3 (three) times daily as needed (x 5 days during menstrual cycle for excessive bleeding.).       No current facility-administered medications for this visit.     Past Medical History  Diagnosis Date  . Ventricular fibrillation   . Symptomatic bradycardia   . Long Q-T syndrome     ROS:   All systems reviewed and negative except as noted in the HPI.   Past Surgical History  Procedure Laterality Date  . Implantable cardioverter defibrillator implant  2009; 11-18-13    MDT single chamber ICD implanted by Dr Ladona Ridgel 2009; upgrade to dual chamber MDT ICD 11/2013 by Dr Ladona Ridgel  . Tympanostomy tube placement    . Dilation and curettage of uterus  1999  . Cesarean section  1994  . Adenoidectomy    . Intrauterine device insertion  February 13, 2011    PARAGUARD      Family History  Problem Relation Age of Onset  . Hypertension Father   . Heart disease Father     PACEMAKER INSERTED  . Diabetes Maternal Grandmother   .  Cancer Maternal Grandmother     COLON CANCER     History   Social History  . Marital Status: Married    Spouse Name: N/A    Number of Children: N/A  . Years of Education: N/A   Occupational History  . Not on file.   Social History Main Topics  . Smoking status: Never Smoker   . Smokeless tobacco: Never Used  . Alcohol Use: Yes     Comment: occasionally  . Drug Use: No  . Sexual Activity: Yes    Partners: Male    Birth Control/ Protection: IUD     Comment: PARAGARD   Other Topics Concern  . Not on file   Social History Narrative  . No narrative on file     BP 95/67  Pulse 50  Ht 5\' 4"  (1.626 m)  Wt 135 lb 12.8 oz (61.598 kg)  BMI 23.30 kg/m2  Physical Exam:  Well appearing 48 yo woman, NAD HEENT: Unremarkable Neck:  No JVD, no thyromegally Back:  No CVA tenderness Lungs:  Clear with no wheezes HEART:  Regular rate rhythm, no murmurs, no rubs, no clicks Abd:  soft, positive bowel sounds, no organomegally, no rebound, no guarding Ext:  2 plus pulses, no  edema, no cyanosis, no clubbing Skin:  No rashes no nodules Neuro:  CN II through XII intact, motor grossly intact   DEVICE  Normal device function.  See PaceArt for details.   Assess/Plan:

## 2014-03-10 NOTE — Assessment & Plan Note (Signed)
She is maintaining NSR over 99% of the time.

## 2014-03-10 NOTE — Assessment & Plan Note (Signed)
Her Medtronic device is working normally. Will recheck in several months. 

## 2014-05-16 IMAGING — CR DG CHEST 2V
2 series · 2 of 2 positions shown · non-contrast
Comparison: DG CHEST 2 VIEW dated 06/22/2012

CLINICAL DATA: Status post ICD placement

EXAM:
CHEST  2 VIEW

[w chest pa]
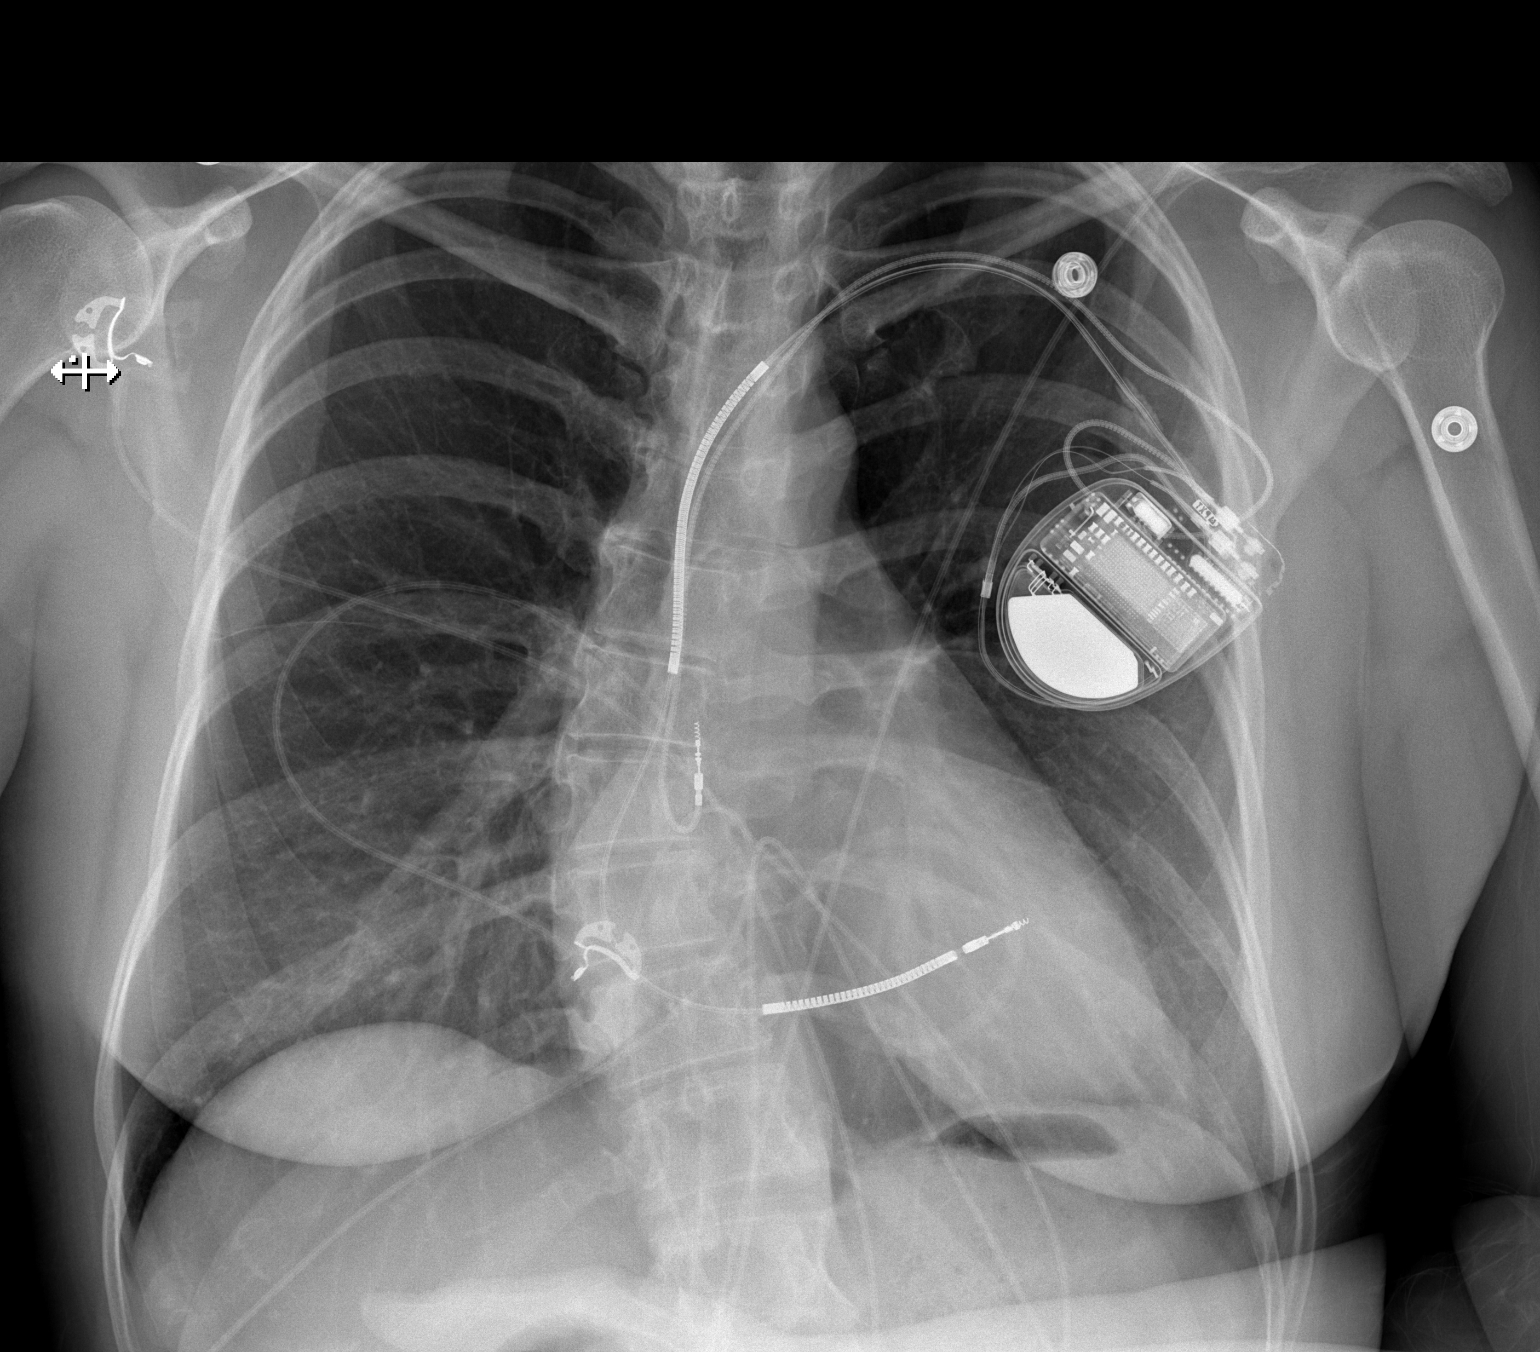

[w chest lat]
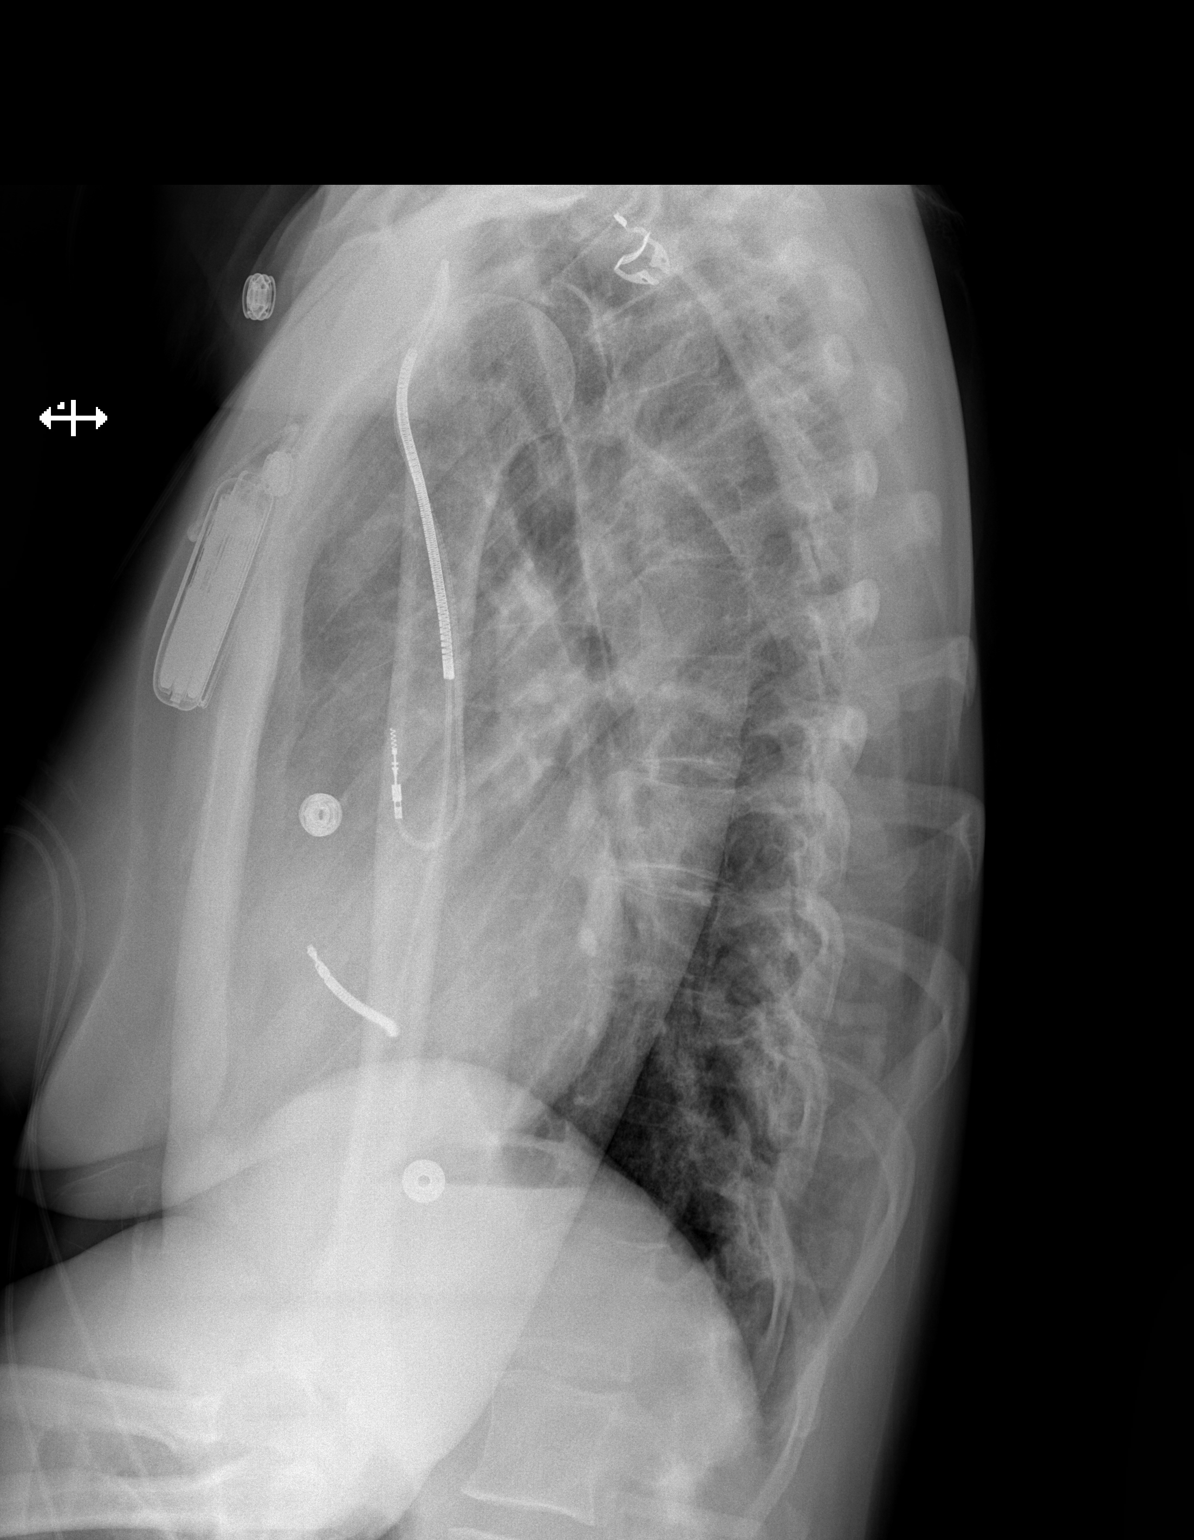

[2 of 2 positions shown; findings below may reference images not displayed]

FINDINGS: The lungs are adequately inflated. There is no evidence of a
pneumothorax or pleural effusion. The cardiopericardial silhouette
is normal in size. The pulmonary vascularity is not engorged. The
pacemaker defibrillator generator overlies the left mid lung.
Positioning of the electrodes is radiographically acceptable. There
is stable dextroscoliosis of the mid thoracic spine.
IMPRESSION: The patient has undergone ICD placement without evidence of
immediate postprocedure complication.

## 2014-05-29 ENCOUNTER — Encounter: Payer: BC Managed Care – PPO | Admitting: Gynecology

## 2014-05-29 ENCOUNTER — Other Ambulatory Visit: Payer: Self-pay | Admitting: Internal Medicine

## 2014-05-30 ENCOUNTER — Other Ambulatory Visit: Payer: Self-pay | Admitting: *Deleted

## 2014-05-30 ENCOUNTER — Other Ambulatory Visit: Payer: Self-pay | Admitting: Internal Medicine

## 2014-05-30 DIAGNOSIS — I4581 Long QT syndrome: Secondary | ICD-10-CM

## 2014-05-30 MED ORDER — METOPROLOL SUCCINATE ER 50 MG PO TB24: ORAL_TABLET | ORAL | Status: DC

## 2014-06-05 ENCOUNTER — Encounter: Payer: Self-pay | Admitting: Internal Medicine

## 2014-06-12 ENCOUNTER — Ambulatory Visit (INDEPENDENT_AMBULATORY_CARE_PROVIDER_SITE_OTHER): Payer: BC Managed Care – PPO | Admitting: *Deleted

## 2014-06-12 ENCOUNTER — Telehealth: Payer: Self-pay | Admitting: Cardiology

## 2014-06-12 DIAGNOSIS — I4581 Long QT syndrome: Secondary | ICD-10-CM

## 2014-06-12 DIAGNOSIS — I469 Cardiac arrest, cause unspecified: Secondary | ICD-10-CM

## 2014-06-12 NOTE — Telephone Encounter (Signed)
Spoke with pt and reminded pt of remote transmission that is due today. Pt verbalized understanding.   

## 2014-06-13 LAB — MDC_IDC_ENUM_SESS_TYPE_REMOTE
Battery Remaining Longevity: 124 mo
Battery Voltage: 3.04 V
Brady Statistic AP VP Percent: 0.01 %
Brady Statistic AS VS Percent: 84.58 %
Brady Statistic RA Percent Paced: 15.37 %
Brady Statistic RV Percent Paced: 0.06 %
Date Time Interrogation Session: 20151110031951
HIGH POWER IMPEDANCE MEASURED VALUE: 62 Ohm
HighPow Impedance: 45 Ohm
Lead Channel Impedance Value: 342 Ohm
Lead Channel Impedance Value: 456 Ohm
Lead Channel Pacing Threshold Amplitude: 0.5 V
Lead Channel Pacing Threshold Pulse Width: 0.4 ms
Lead Channel Pacing Threshold Pulse Width: 0.4 ms
Lead Channel Sensing Intrinsic Amplitude: 0.75 mV
Lead Channel Sensing Intrinsic Amplitude: 6.375 mV
MDC IDC MSMT LEADCHNL RA IMPEDANCE VALUE: 513 Ohm
MDC IDC MSMT LEADCHNL RV PACING THRESHOLD AMPLITUDE: 0.875 V
MDC IDC SET LEADCHNL RA PACING AMPLITUDE: 2 V
MDC IDC SET LEADCHNL RV PACING AMPLITUDE: 2.5 V
MDC IDC SET LEADCHNL RV PACING PULSEWIDTH: 0.4 ms
MDC IDC SET LEADCHNL RV SENSING SENSITIVITY: 0.3 mV
MDC IDC SET ZONE DETECTION INTERVAL: 350 ms
MDC IDC SET ZONE DETECTION INTERVAL: 450 ms
MDC IDC STAT BRADY AP VS PERCENT: 15.36 %
MDC IDC STAT BRADY AS VP PERCENT: 0.05 %
Zone Setting Detection Interval: 250 ms
Zone Setting Detection Interval: 300 ms

## 2014-06-13 NOTE — Progress Notes (Signed)
Remote ICD transmission.   

## 2014-06-22 ENCOUNTER — Encounter: Payer: Self-pay | Admitting: Cardiology

## 2014-07-03 ENCOUNTER — Encounter: Payer: Self-pay | Admitting: Internal Medicine

## 2014-07-13 ENCOUNTER — Encounter (HOSPITAL_COMMUNITY): Payer: Self-pay | Admitting: Internal Medicine

## 2014-08-04 DIAGNOSIS — Z975 Presence of (intrauterine) contraceptive device: Secondary | ICD-10-CM

## 2014-08-04 HISTORY — DX: Presence of (intrauterine) contraceptive device: Z97.5

## 2014-09-07 ENCOUNTER — Encounter: Payer: Self-pay | Admitting: *Deleted

## 2014-09-07 ENCOUNTER — Ambulatory Visit (INDEPENDENT_AMBULATORY_CARE_PROVIDER_SITE_OTHER): Payer: BC Managed Care – PPO | Admitting: Internal Medicine

## 2014-09-07 ENCOUNTER — Telehealth: Payer: Self-pay | Admitting: Internal Medicine

## 2014-09-07 ENCOUNTER — Encounter: Payer: Self-pay | Admitting: Internal Medicine

## 2014-09-07 VITALS — BP 95/60 | HR 99 | Ht 64.0 in | Wt 135.0 lb

## 2014-09-07 DIAGNOSIS — I4581 Long QT syndrome: Secondary | ICD-10-CM

## 2014-09-07 DIAGNOSIS — I483 Typical atrial flutter: Secondary | ICD-10-CM

## 2014-09-07 DIAGNOSIS — Z95810 Presence of automatic (implantable) cardiac defibrillator: Secondary | ICD-10-CM

## 2014-09-07 DIAGNOSIS — I4892 Unspecified atrial flutter: Secondary | ICD-10-CM | POA: Insufficient documentation

## 2014-09-07 DIAGNOSIS — I480 Paroxysmal atrial fibrillation: Secondary | ICD-10-CM

## 2014-09-07 LAB — MDC_IDC_ENUM_SESS_TYPE_INCLINIC
Battery Voltage: 3.03 V
Brady Statistic AP VS Percent: 12.8 %
Brady Statistic AS VP Percent: 0.05 %
Brady Statistic RA Percent Paced: 12.81 %
Date Time Interrogation Session: 20160204172554
HIGH POWER IMPEDANCE MEASURED VALUE: 66 Ohm
HighPow Impedance: 171 Ohm
HighPow Impedance: 48 Ohm
Lead Channel Pacing Threshold Amplitude: 0.5 V
Lead Channel Pacing Threshold Amplitude: 1 V
Lead Channel Pacing Threshold Pulse Width: 0.4 ms
Lead Channel Pacing Threshold Pulse Width: 0.4 ms
Lead Channel Sensing Intrinsic Amplitude: 0.875 mV
Lead Channel Setting Pacing Amplitude: 2.5 V
Lead Channel Setting Pacing Pulse Width: 0.4 ms
Lead Channel Setting Sensing Sensitivity: 0.3 mV
MDC IDC MSMT BATTERY REMAINING LONGEVITY: 123 mo
MDC IDC MSMT LEADCHNL RA IMPEDANCE VALUE: 532 Ohm
MDC IDC MSMT LEADCHNL RV IMPEDANCE VALUE: 456 Ohm
MDC IDC MSMT LEADCHNL RV SENSING INTR AMPL: 6.625 mV
MDC IDC SET LEADCHNL RA PACING AMPLITUDE: 2 V
MDC IDC SET ZONE DETECTION INTERVAL: 250 ms
MDC IDC SET ZONE DETECTION INTERVAL: 350 ms
MDC IDC STAT BRADY AP VP PERCENT: 0.01 %
MDC IDC STAT BRADY AS VS PERCENT: 87.14 %
MDC IDC STAT BRADY RV PERCENT PACED: 0.06 %
Zone Setting Detection Interval: 300 ms
Zone Setting Detection Interval: 450 ms

## 2014-09-07 NOTE — Telephone Encounter (Signed)
Ms. Handcock called at approximately 10 pm last night. She has an ICD for long QT. She had had some palpitations and what appeared to be a regular tachycardia of 120-140. She felt stable, no dizziness or chest pain. We discussed options - come to ER, rest at home or defer unless symptoms changed; she was going to take an additional metoprolol. If symptoms worsened at all, she would come to ER or call 911 (she was not alone at home), otherwise she would call/come to clinic in the AM to have interrogation/evaluation if her symptoms/tachycardia did not resolve. She agreed with this plan and told me she would come to the ER if any changes or symptoms or concerns.

## 2014-09-07 NOTE — Progress Notes (Signed)
Electrophysiology Office Note   Date:  09/07/2014   ID:  [name], DOB 1965/12/27, MRN [id number]  PCP:  Default, Provider, MD   Primary Electrophysiologist: Dr [name]  Chief Complaint  Patient presents with  . Palpitations     History of Present Illness: [name] is a 49 y.o. female who presents today for urgent electrophysiology evaluation.   She reports that last night she began developing tachypalpitations.  She reports having a sense of uneasiness.  She had fatigue and SOB.  She sent a carelink transmission with revealed that she had atrial flutter with rapid ventricular rates.  She was therefore instructed to come to our office for further evaluation.  At this time, she feels "OK".   She denies chest pain,  ICD shocks, dizziness, presyncope, syncope, bleeding, or neurologic sequela. The patient is tolerating medications without difficulties and is otherwise without complaint today.    Past Medical History  Diagnosis Date  . Ventricular fibrillation   . Symptomatic bradycardia   . Long Q-T syndrome   . Atrial flutter    Past Surgical History  Procedure Laterality Date  . Implantable cardioverter defibrillator implant  2009; 11-18-13    MDT single chamber ICD implanted by Dr [name] 2009; upgrade to dual chamber MDT ICD 11/2013 by Dr [name]  . Tympanostomy tube placement    . Dilation and curettage of uterus  1999  . Cesarean section  1994  . Adenoidectomy    . Intrauterine device insertion  February 13, 2011    PARAGUARD   . Implantable cardioverter defibrillator (icd) generator change N/A 11/18/2013    Procedure: ICD GENERATOR CHANGE;  Surgeon: Marinus Maw, MD;  Location: [facility] CATH LAB;  Service: Cardiovascular;  Laterality: N/A;     Current Outpatient Prescriptions  Medication Sig Dispense Refill  . diphenhydramine-acetaminophen (TYLENOL PM) 25-500 MG TABS Take 2 tablets by mouth at bedtime as needed.    Marland Kitchen ibuprofen (ADVIL,MOTRIN) 200 MG tablet Take 200  mg by mouth every 6 (six) hours as needed. For pain    . metoprolol succinate (TOPROL-XL) 50 MG 24 hr tablet TAKE 1/2 TABLET EVERY MORNING AND 1 TABLET EVERY NIGHT AT BEDTIME 135 tablet 3  . mexiletine (MEXITIL) 150 MG capsule Take 1 capsule (150 mg total) by mouth 2 (two) times daily. 180 capsule 3  . potassium chloride SA (K-DUR,KLOR-CON) 20 MEQ tablet TAKE 2 TABLETS BY MOUTH TWICE DAILY 360 tablet 3  . tranexamic acid (LYSTEDA) 650 MG TABS tablet Take 1,300 mg by mouth 3 (three) times daily as needed (x 5 days during menstrual cycle for excessive bleeding.).     No current facility-administered medications for this visit.    Allergies:   Review of patient's allergies indicates no known allergies.   Social History:  The patient  reports that she has never smoked. She has never used smokeless tobacco. She reports that she drinks alcohol. She reports that she does not use illicit drugs.   Family History:  The patient's family history includes Cancer in her maternal grandmother; Diabetes in her maternal grandmother; Heart disease in her father; Hypertension in her father.    ROS:  Please see the history of present illness.   All other systems are reviewed and negative.    PHYSICAL EXAM: VS:  BP 95/60 mmHg  Pulse 99  Ht  (1.626 m)  Wt 135 lb (61.236 kg)  BMI 23.16 kg/m2 , BMI Body mass index is 23.16 kg/(m^2). GEN: Well nourished,  well developed, in no acute distress HEENT: normal Neck: no JVD  Cardiac: tachycardic irregular rhythm no murmurs, rubs, or gallops,no edema  Respiratory:  clear to auscultation bilaterally, normal work of breathing GI: soft, nontender  MS: no deformity or atrophy Skin: warm and dry  Neuro:  Strength and sensation are intact Psych: euthymic mood, full affect  EKG:  EKG is ordered today. The ekg ordered today shows typical appering atrial flutter with 2:1 AV conduction  Device interrogation is reviewed today in detail.  See PaceArt for  details.   Recent Labs: 11/18/2013: BUN 14; Creatinine 0.74; Hemoglobin 14.7; Platelets 205; Potassium 4.0; Sodium 141    Lipid Panel     Component Value Date/Time   CHOL 139 09/02/2012 1604   TRIG 134 10/29/2009 0230   HDL 39* 10/29/2009 0230   CHOLHDL 4.3 10/29/2009 0230   VLDL 27 10/29/2009 0230   LDLCALC * 10/29/2009 0230    100        Total Cholesterol/HDL:CHD Risk Coronary Heart Disease Risk Table                     Men   Women  1/2 Average Risk   3.4   3.3  Average Risk       5.0   4.4  2 X Average Risk   9.6   7.1  3 X Average Risk  23.4   11.0        Use the calculated Patient Ratio above and the CHD Risk Table to determine the patient's CHD Risk.        ATP III CLASSIFICATION (LDL):  <100     mg/dL   Optimal  161-096  mg/dL   Near or Above                    Optimal  130-159  mg/dL   Borderline  045-409  mg/dL   High  >811     mg/dL   Very High     Wt Readings from Last 3 Encounters:  09/07/14 135 lb (61.236 kg)  03/09/14 135 lb 12.8 oz (61.598 kg)  12/27/13 147 lb 3.2 oz (66.769 kg)      Other studies Reviewed: Additional studies/ records that were reviewed today include: remote device interrogation from yesterday  Review of the above records today demonstrates: atrial flutter < 24 hours in duration with elevated V rates   ASSESSMENT AND PLAN:  1.  Typical atrial flutter The patient presents with new onset typical appearing atrial flutter with RVR.  She states that she has a h/o afib though I do not see this well documented.  Her Chads2vasc score is 1.  She therefore does not require long term anticoagulation.  I worry that she may not tolerate atrial flutter long term and is at very high risk of hospitalization/ decompensation/ ICD shocks.   Today, we discussed options of pace termination of Atrial flutter using her defibrillation in the office and also cardioversion at [facility] urgently this afternoon.  She understands that there is a risk of  stroke but would like to proceed with attempt of pace termination of atrial flutter through her device.  2. Long QT/ history of VF Normal ICD function No changes today The importance of remote monitoring long term is stressed today.    Current medicines are reviewed at length with the patient today.   The patient does not have concerns regarding her medicines.  The following changes were  made today:  none  Labs/ tests ordered today include:  Orders Placed This Encounter  Procedures  . Implantable device check      Addendum:  Noninvasive programmed stimulation was performed using the patients defibrillator today.  The atrial flutter cycle length measured 240 msec through the device.  Burst atrial pacing was performed from cycle lengths of 240 msec down to 180 msec.  Atrial flutter was successfully terminated with burst rapid atrial pacing at 180 msec.  She remained in sinus rhythm thereafter.  Her tachypalpitations, fatigue, and SOB resolved.  She denies any neuro symptoms.  OK to send home.  She no longer requires admission for cardioversion now that she has been successfully converted to sinus rhythm through her device today. She will need to follow-up with Dr [name] for further discussions.  Atrial flutter ablation would be a very reasonable option for this patient to prevent clinical decline, reduce stroke risks, and potentially prevent ICD therapy.

## 2014-09-14 ENCOUNTER — Telehealth: Payer: Self-pay | Admitting: Cardiology

## 2014-09-14 ENCOUNTER — Ambulatory Visit (INDEPENDENT_AMBULATORY_CARE_PROVIDER_SITE_OTHER): Payer: BC Managed Care – PPO | Admitting: *Deleted

## 2014-09-14 DIAGNOSIS — I469 Cardiac arrest, cause unspecified: Secondary | ICD-10-CM

## 2014-09-14 LAB — MDC_IDC_ENUM_SESS_TYPE_REMOTE
Brady Statistic AP VP Percent: 0 %
Brady Statistic AS VP Percent: 0.07 %
Brady Statistic AS VS Percent: 89.94 %
Brady Statistic RV Percent Paced: 0.07 %
Date Time Interrogation Session: 20160214143634
HighPow Impedance: 42 Ohm
HighPow Impedance: 56 Ohm
Lead Channel Impedance Value: 418 Ohm
Lead Channel Impedance Value: 418 Ohm
Lead Channel Pacing Threshold Amplitude: 0.875 V
Lead Channel Pacing Threshold Pulse Width: 0.4 ms
Lead Channel Sensing Intrinsic Amplitude: 3.125 mV
Lead Channel Sensing Intrinsic Amplitude: 3.125 mV
Lead Channel Sensing Intrinsic Amplitude: 7.5 mV
Lead Channel Sensing Intrinsic Amplitude: 7.5 mV
Lead Channel Setting Pacing Pulse Width: 0.4 ms
Lead Channel Setting Sensing Sensitivity: 0.3 mV
MDC IDC MSMT BATTERY REMAINING LONGEVITY: 122 mo
MDC IDC MSMT BATTERY VOLTAGE: 3.03 V
MDC IDC MSMT LEADCHNL RA PACING THRESHOLD AMPLITUDE: 0.5 V
MDC IDC MSMT LEADCHNL RV IMPEDANCE VALUE: 342 Ohm
MDC IDC MSMT LEADCHNL RV PACING THRESHOLD PULSEWIDTH: 0.4 ms
MDC IDC SET LEADCHNL RA PACING AMPLITUDE: 2 V
MDC IDC SET LEADCHNL RV PACING AMPLITUDE: 2.5 V
MDC IDC SET ZONE DETECTION INTERVAL: 300 ms
MDC IDC SET ZONE DETECTION INTERVAL: 350 ms
MDC IDC STAT BRADY AP VS PERCENT: 10 %
MDC IDC STAT BRADY RA PERCENT PACED: 10 %
Zone Setting Detection Interval: 250 ms
Zone Setting Detection Interval: 450 ms

## 2014-09-14 NOTE — Telephone Encounter (Signed)
Spoke with pt and reminded pt of remote transmission that is due today. Pt verbalized understanding.   

## 2014-09-22 NOTE — Progress Notes (Signed)
Remote ICD transmission.   

## 2014-10-19 ENCOUNTER — Encounter: Payer: Self-pay | Admitting: Cardiology

## 2014-10-25 ENCOUNTER — Encounter: Payer: Self-pay | Admitting: Internal Medicine

## 2014-11-13 ENCOUNTER — Ambulatory Visit (INDEPENDENT_AMBULATORY_CARE_PROVIDER_SITE_OTHER): Payer: BC Managed Care – PPO | Admitting: Gynecology

## 2014-11-13 ENCOUNTER — Encounter: Payer: Self-pay | Admitting: Gynecology

## 2014-11-13 ENCOUNTER — Other Ambulatory Visit: Payer: Self-pay

## 2014-11-13 ENCOUNTER — Other Ambulatory Visit (HOSPITAL_COMMUNITY)
Admission: RE | Admit: 2014-11-13 | Discharge: 2014-11-13 | Disposition: A | Discharge status: Home / Self Care | Payer: BC Managed Care – PPO | Source: Ambulatory Visit | Attending: Gynecology | Admitting: Gynecology

## 2014-11-13 VITALS — BP 120/70 | Ht 63.5 in | Wt 154.0 lb

## 2014-11-13 DIAGNOSIS — Z01419 Encounter for gynecological examination (general) (routine) without abnormal findings: Secondary | ICD-10-CM | POA: Diagnosis not present

## 2014-11-13 DIAGNOSIS — N7011 Chronic salpingitis: Secondary | ICD-10-CM | POA: Diagnosis not present

## 2014-11-13 DIAGNOSIS — Z01411 Encounter for gynecological examination (general) (routine) with abnormal findings: Secondary | ICD-10-CM | POA: Insufficient documentation

## 2014-11-13 DIAGNOSIS — Z1151 Encounter for screening for human papillomavirus (HPV): Secondary | ICD-10-CM | POA: Diagnosis present

## 2014-11-13 DIAGNOSIS — N951 Menopausal and female climacteric states: Secondary | ICD-10-CM

## 2014-11-13 DIAGNOSIS — Z1231 Encounter for screening mammogram for malignant neoplasm of breast: Secondary | ICD-10-CM

## 2014-11-13 NOTE — Patient Instructions (Addendum)
Transvaginal Ultrasound Transvaginal ultrasound is a pelvic ultrasound, using a metal probe that is placed in the vagina, to look at a women's female organs. Transvaginal ultrasound is a method of seeing inside the pelvis of a woman. The ultrasound machine sends out sound waves from the transducer (probe). These sound waves bounce off body structures (like an echo) to create a picture. The picture shows up on a monitor. It is called transvaginal because the probe is inserted into the vagina. There should be very little discomfort from the vaginal probe. This test can also be used during pregnancy. Endovaginal ultrasound is another name for a transvaginal ultrasound. In a transabdominal ultrasound, the probe is placed on the outside of the belly. This method gives pictures that are lower quality than pictures from the transvaginal technique. Transvaginal ultrasound is used to look for problems of the female genital tract. Some such problems include:  Infertility problems.  Congenital (birth defect) malformations of the uterus and ovaries.  Tumors in the uterus.  Abnormal bleeding.  Ovarian tumors and cysts.  Abscess (inflamed tissue around pus) in the pelvis.  Unexplained abdominal or pelvic pain.  Pelvic infection. DURING PREGNANCY, TRANSVAGINAL ULTRASOUND MAY BE USED TO LOOK AT:  Normal pregnancy.  Ectopic pregnancy (pregnancy outside the uterus).  Fetal heartbeat.  Abnormalities in the pelvis, that are not seen well with transabdominal ultrasound.  Suspected twins or multiples.  Impending miscarriage.  Problems with the cervix (incompetent cervix, not able to stay closed and hold the baby).  When doing an amniocentesis (removing fluid from the pregnancy sac, for testing).  Looking for abnormalities of the baby.  Checking the growth, development, and age of the fetus.  Measuring the amount of fluid in the amniotic sac.  When doing an external version of the baby (moving  baby into correct position).  Evaluating the baby for problems in high risk pregnancies (biophysical profile).  Suspected fetal demise (death). Sometimes a special ultrasound method called Saline Infusion Sonography (SIS) is used for a more accurate look at the uterus. Sterile saline (salt water) is injected into the uterus of non-pregnant patients to see the inside of the uterus better. SIS is not used on pregnant women. The vaginal probe can also assist in obtaining biopsies of abnormal areas, in draining fluid from cysts on the ovary, and in finding IUDs (intrauterine device, birth control) that cannot be located. PREPARATION FOR TEST A transvaginal ultrasound is done with the bladder empty. The transabdominal ultrasound is done with your bladder full. You may be asked to drink several glasses of water before that exam. Sometimes, a transabdominal ultrasound is done just after a transvaginal ultrasound, to look at organs in your abdomen. PROCEDURE  You will lie down on a table, with your knees bent and your feet in foot holders. The probe is covered with a condom. A sterile lubricant is put into the vagina and on the probe. The lubricant helps transmit the sound waves and avoid irritating the vagina. Your caregiver will move the probe inside the vaginal cavity to scan the pelvic structures. A normal test will show a normal pelvis and normal contents. An abnormal test will show abnormalities of the pelvis, placenta, or baby. ABNORMAL RESULTS MAY BE DUE TO:  Growths or tumors in the:  Uterus.  Ovaries.  Vagina.  Other pelvic structures.  Non-cancerous growths of the uterus and ovaries.  Twisting of the ovary, cutting off blood supply to the ovary (ovarian torsion).  Areas of infection, including:  Pelvic  inflammatory disease.  Abscess in the pelvis.  Locating an IUD. PROBLEMS FOUND IN PREGNANT WOMEN MAY INCLUDE:  Ectopic pregnancy (pregnancy outside the uterus).  Multiple  pregnancies.  Early dilation (opening) of the cervix. This may indicate an incompetent cervix and early delivery.  Impending miscarriage.  Fetal death.  Problems with the placenta, including:  Placenta has grown over the opening of the womb (placenta previa).  Placenta has separated early in the womb (placental abruption).  Placenta grows into the muscle of the uterus (placenta accreta).  Tumors of pregnancy, including gestational trophoblastic disease. This is an abnormal pregnancy, with no fetus. The uterus is filled with many grape-like cysts that could sometimes be cancerous.  Incorrect position of the fetus (breech, vertex).  Intrauterine fetal growth retardation (IUGR) (poor growth in the womb).  Fetal abnormalities or infection. RISKS AND COMPLICATIONS There are no known risks to the ultrasound procedure. There is no X-ray used when doing an ultrasound. Document Released: 07/02/2004 Document Revised: 10/13/2011 Document Reviewed: 06/20/2009 [facility] Patient Information 2015 [place], [place]. This information is not intended to replace advice given to you by your health care provider. Make sure you discuss any questions you have with your health care provider. Hormone Therapy At menopause, your body begins making less estrogen and progesterone hormones. This causes the body to stop having menstrual periods. This is because estrogen and progesterone hormones control your periods and menstrual cycle. A lack of estrogen may cause symptoms such as: Hot flushes (or hot flashes). Vaginal dryness. Dry skin. Loss of sex drive. Risk of bone loss (osteoporosis). When this happens, you may choose to take hormone therapy to get back the estrogen lost during menopause. When the hormone estrogen is given alone, it is usually referred to as ET (Estrogen Therapy). When the hormone progestin is combined with estrogen, it is generally called HT (Hormone Therapy). This was formerly known as  hormone replacement therapy (HRT). Your caregiver can help you make a decision on what will be best for you. The decision to use HT seems to change often as new studies are done. Many studies do not agree on the benefits of hormone replacement therapy. LIKELY BENEFITS OF HT INCLUDE PROTECTION FROM: Hot Flushes (also called hot flashes) - A hot flush is a sudden feeling of heat that spreads over the face and body. The skin may redden like a blush. It is connected with sweats and sleep disturbance. Women going through menopause may have hot flushes a few times a month or several times per day depending on the woman. Osteoporosis (bone loss)- Estrogen helps guard against bone loss. After menopause, a woman's bones slowly lose calcium and become weak and brittle. As a result, bones are more likely to break. The hip, wrist, and spine are affected most often. Hormone therapy can help slow bone loss after menopause. Weight bearing exercise and taking calcium with vitamin D also can help prevent bone loss. There are also medications that your caregiver can prescribe that can help prevent osteoporosis. Vaginal Dryness - Loss of estrogen causes changes in the vagina. Its lining may become thin and dry. These changes can cause pain and bleeding during sexual intercourse. Dryness can also lead to infections. This can cause burning and itching. (Vaginal estrogen treatment can help relieve pain, itching, and dryness.) Urinary Tract Infections are more common after menopause because of lack of estrogen. Some women also develop urinary incontinence because of low estrogen levels in the vagina and bladder. Possible other benefits of estrogen include  a positive effect on mood and short-term memory in women. RISKS AND COMPLICATIONS Using estrogen alone without progesterone causes the lining of the uterus to grow. This increases the risk of lining of the uterus (endometrial) cancer. Your caregiver should give another hormone  called progestin if you have a uterus. Women who take combined (estrogen and progestin) HT appear to have an increased risk of breast cancer. The risk appears to be small, but increases throughout the time that HT is taken. Combined therapy also makes the breast tissue slightly denser which makes it harder to read mammograms (breast X-rays). Combined, estrogen and progesterone therapy can be taken together every day, in which case there may be spotting of blood. HT therapy can be taken cyclically in which case you will have menstrual periods. Cyclically means HT is taken for a set amount of days, then not taken, then this process is repeated. HT may increase the risk of stroke, heart attack, breast cancer and forming blood clots in your leg. Transdermal estrogen (estrogen that is absorbed through the skin with a patch or a cream) may have more positive results with: Cholesterol. Blood pressure. Blood clots. Having the following conditions may indicate you should not have HT: Endometrial cancer. Liver disease. Breast cancer. Heart disease. History of blood clots. Stroke. TREATMENT  If you choose to take HT and have a uterus, usually estrogen and progestin are prescribed. Your caregiver will help you decide the best way to take the medications. Possible ways to take estrogen include: Pills. Patches. Gels. Sprays. Vaginal estrogen cream, rings and tablets. It is best to take the lowest dose possible that will help your symptoms and take them for the shortest period of time that you can. Hormone therapy can help relieve some of the problems (symptoms) that affect women at menopause. Before making a decision about HT, talk to your caregiver about what is best for you. Be well informed and comfortable with your decisions. HOME CARE INSTRUCTIONS  Follow your caregivers advice when taking the medications. A Pap test is done to screen for cervical cancer. The first Pap test should be done at age  60. Between ages 31 and 55, Pap tests are repeated every 2 years. Beginning at age 41, you are advised to have a Pap test every 3 years as long as your past 3 Pap tests have been normal. Some women have medical problems that increase the chance of getting cervical cancer. Talk to your caregiver about these problems. It is especially important to talk to your caregiver if a new problem develops soon after your last Pap test. In these cases, your caregiver may recommend more frequent screening and Pap tests. The above recommendations are the same for women who have or have not gotten the vaccine for HPV (Human Papillomavirus). If you had a hysterectomy for a problem that was not a cancer or a condition that could lead to cancer, then you no longer need Pap tests. However, even if you no longer need a Pap test, a regular exam is a good idea to make sure no other problems are starting.  If you are between ages 9 and 60, and you have had normal Pap tests going back 10 years, you no longer need Pap tests. However, even if you no longer need a Pap test, a regular exam is a good idea to make sure no other problems are starting.  If you have had past treatment for cervical cancer or a condition that could lead to cancer,  you need Pap tests and screening for cancer for at least 20 years after your treatment. If Pap tests have been discontinued, risk factors (such as a new sexual partner) need to be re-assessed to determine if screening should be resumed. Some women may need screenings more often if they are at high risk for cervical cancer. Get mammograms done as per the advice of your caregiver. SEEK IMMEDIATE MEDICAL CARE IF: You develop abnormal vaginal bleeding. You have pain or swelling in your legs, shortness of breath, or chest pain. You develop dizziness or headaches. You have lumps or changes in your breasts or armpits. You have slurred speech. You develop weakness or numbness of your arms or  legs. You have pain, burning, or bleeding when urinating. You develop abdominal pain. Document Released: 04/19/2003 Document Revised: 10/13/2011 Document Reviewed: 08/07/2010 [facility] Patient Information 2015 [place], [place]. This information is not intended to replace advice given to you by your health care provider. Make sure you discuss any questions you have with your health care provider. Perimenopause Perimenopause is the time when your body begins to move into the menopause (no menstrual period for 12 straight months). It is a natural process. Perimenopause can begin 2-8 years before the menopause and usually lasts for 1 year after the menopause. During this time, your ovaries may or may not produce an egg. The ovaries vary in their production of estrogen and progesterone hormones each month. This can cause irregular menstrual periods, difficulty getting pregnant, vaginal bleeding between periods, and uncomfortable symptoms. CAUSES Irregular production of the ovarian hormones, estrogen and progesterone, and not ovulating every month. Other causes include: Tumor of the pituitary gland in the brain. Medical disease that affects the ovaries. Radiation treatment. Chemotherapy. Unknown causes. Heavy smoking and excessive alcohol intake can bring on perimenopause sooner. SIGNS AND SYMPTOMS  Hot flashes. Night sweats. Irregular menstrual periods. Decreased sex drive. Vaginal dryness. Headaches. Mood swings. Depression. Memory problems. Irritability. Tiredness. Weight gain. Trouble getting pregnant. The beginning of losing bone cells (osteoporosis). The beginning of hardening of the arteries (atherosclerosis). DIAGNOSIS  Your health care provider will make a diagnosis by analyzing your age, menstrual history, and symptoms. He or she will do a physical exam and note any changes in your body, especially your female organs. Female hormone tests may or may not be helpful depending on the  amount of female hormones you produce and when you produce them. However, other hormone tests may be helpful to rule out other problems. TREATMENT  In some cases, no treatment is needed. The decision on whether treatment is necessary during the perimenopause should be made by you and your health care provider based on how the symptoms are affecting you and your lifestyle. Various treatments are available, such as: Treating individual symptoms with a specific medicine for that symptom. Herbal medicines that can help specific symptoms. Counseling. Group therapy. HOME CARE INSTRUCTIONS  Keep track of your menstrual periods (when they occur, how heavy they are, how long between periods, and how long they last) as well as your symptoms and when they started. Only take over-the-counter or prescription medicines as directed by your health care provider. Sleep and rest. Exercise. Eat a diet that contains calcium (good for your bones) and soy (acts like the estrogen hormone). Do not smoke. Avoid alcoholic beverages. Take vitamin supplements as recommended by your health care provider. Taking vitamin E may help in certain cases. Take calcium and vitamin D supplements to help prevent bone loss. Group therapy is sometimes  helpful. Acupuncture may help in some cases. SEEK MEDICAL CARE IF:  You have questions about any symptoms you are having. You need a referral to a specialist (gynecologist, psychiatrist, or psychologist). SEEK IMMEDIATE MEDICAL CARE IF:  You have vaginal bleeding. Your period lasts longer than 8 days. Your periods are recurring sooner than 21 days. You have bleeding after intercourse. You have severe depression. You have pain when you urinate. You have severe headaches. You have vision problems. Document Released: 08/28/2004 Document Revised: 05/11/2013 Document Reviewed: 02/17/2013 [facility] Patient Information 2015 [place], [place]. This information is not intended to replace  advice given to you by your health care provider. Make sure you discuss any questions you have with your health care provider.

## 2014-11-13 NOTE — Progress Notes (Signed)
[name] 27-Aug-1965 [id number]   History:    49 y.o.  for annual gyn exam who states that she had been bleeding for proximally 4 weeks prior to that she was having normal menstrual cycles. But sometimes she will go 3 months without a cycle and then bleed for a few days and then skip a few periods also. She's had some vasomotor symptoms. Patient with past history of right hydrosalpinx noted on ultrasound in 2014 and past history of ovarian cysts.  Review of her record indicated that patient has a history of ventricular fibrillation, patient has a pacemaker(implantable defibrillator) in place as a result of her long QT interval and has been under the care of her cardiologist Dr. Ladona Ridgel. She follows up with her cardiologist every 6 months. Patient had a ParaGard T380A IUD placed in 2012. She has suffer from menorrhagia in the past and responded to Lysteda 650 mg 2 tablets by mouth 3 times a day for 5 days of her cycle.She has a history of prior right breast fine-needle aspiration for breast cyst. Patient denies any prior history of abnormal Pap smears. Patient's mother with history of osteoporosis. Patient states she is taking her calcium and vitamin D.   Past medical history,surgical history, family history and social history were all reviewed and documented in the EPIC chart.  Gynecologic History Patient's last menstrual period was 10/16/2014. Contraception: IUD Last Pap: 2012. Results were: normal Last mammogram: 2012. Results were: normal  Obstetric History OB History  Gravida Para Term Preterm AB SAB TAB Ectopic Multiple Living  # Outcome Date GA Lbr Len/2nd Weight Sex Delivery Anes PTL Lv  5 SAB           4 Para           3 Para           2 Para           1 Para                ROS: A ROS was performed and pertinent positives and negatives are included in the history.  GENERAL: No fevers or chills. HEENT: No change in vision, no earache, sore throat or  sinus congestion. NECK: No pain or stiffness. CARDIOVASCULAR: No chest pain or pressure. No palpitations. PULMONARY: No shortness of breath, cough or wheeze. GASTROINTESTINAL: No abdominal pain, nausea, vomiting or diarrhea, melena or bright red blood per rectum. GENITOURINARY: No urinary frequency, urgency, hesitancy or dysuria. MUSCULOSKELETAL: No joint or muscle pain, no back pain, no recent trauma. DERMATOLOGIC: No rash, no itching, no lesions. ENDOCRINE: No polyuria, polydipsia, no heat or cold intolerance. No recent change in weight. HEMATOLOGICAL: No anemia or easy bruising or bleeding. NEUROLOGIC: No headache, seizures, numbness, tingling or weakness. PSYCHIATRIC: No depression, no loss of interest in normal activity or change in sleep pattern.     Exam: chaperone present  BP 120/70 mmHg  Ht 5' 3.5" (1.613 m)  Wt 154 lb (69.854 kg)  BMI 26.85 kg/m2  LMP 10/16/2014  Body mass index is 26.85 kg/(m^2).  General appearance : Well developed well nourished female. No acute distress HEENT: Eyes: no retinal hemorrhage or exudates,  Neck supple, trachea midline, no carotid bruits, no thyroidmegaly Lungs: Clear to auscultation, no rhonchi or wheezes, or rib retractions  Heart: Regular rate and rhythm, no murmurs or gallops Breast:Examined in sitting and supine position were symmetrical in appearance,  no palpable masses or tenderness,  no skin retraction, no nipple inversion, no nipple discharge, no skin discoloration, no axillary or supraclavicular lymphadenopathy Abdomen: no palpable masses or tenderness, no rebound or guarding Extremities: no edema or skin discoloration or tenderness  Pelvic:  Bartholin, Urethra, Skene Glands: Within normal limits             Vagina: No gross lesions or discharge  Cervix: No gross lesions or discharge, IUD string seen  Uterus  anteverted, normal size, shape and consistency, non-tender and mobile  Adnexa  Without masses or tenderness  Anus and perineum   normal   Rectovaginal  normal sphincter tone without palpated masses or tenderness             Hemoccult not indicated     Assessment/Plan:  49 y.o. female for annual exam patient return to the office in one week for an ultrasound for follow-up on hydrosalpinx and on her ovarian cyst history. It appears that patient may be perimenopausal but she skipping cycles and demonstrated heavily. She's going to return later this week for the following fasting blood work: [facility], comprehensive metabolic panel, TSH, fasting lipid profile, CBC, and urinalysis. She was also provided with a requisition to schedule her overdue mammogram. Her Pap smear was done today.   Ok Edwards MD, 5:27 PM 11/13/2014

## 2014-11-15 LAB — CYTOLOGY - PAP

## 2014-11-16 ENCOUNTER — Other Ambulatory Visit: Payer: Self-pay | Admitting: Gynecology

## 2014-11-16 ENCOUNTER — Other Ambulatory Visit: Payer: BC Managed Care – PPO

## 2014-11-16 DIAGNOSIS — R350 Frequency of micturition: Secondary | ICD-10-CM

## 2014-11-16 DIAGNOSIS — Z01419 Encounter for gynecological examination (general) (routine) without abnormal findings: Secondary | ICD-10-CM

## 2014-11-16 LAB — LIPID PANEL
CHOLESTEROL: 197 mg/dL (ref 0–200)
HDL: 47 mg/dL (ref >=46)
LDL CALC: 133 mg/dL — AB (ref 0–99)
Total CHOL/HDL Ratio: 4.2 Ratio
Triglycerides: 87 mg/dL (ref <150)
VLDL: 17 mg/dL (ref 0–40)

## 2014-11-16 LAB — CBC WITH DIFFERENTIAL/PLATELET
Basophils Absolute: 0 10*3/uL (ref 0.0–0.1)
Basophils Relative: 1 % (ref 0–1)
Eosinophils Absolute: 0.1 10*3/uL (ref 0.0–0.7)
Eosinophils Relative: 2 % (ref 0–5)
HEMATOCRIT: 43.1 % (ref 36.0–46.0)
HEMOGLOBIN: 14.1 g/dL (ref 12.0–15.0)
Lymphocytes Relative: 44 % (ref 12–46)
Lymphs Abs: 1.8 10*3/uL (ref 0.7–4.0)
MCH: 31.6 pg (ref 26.0–34.0)
MCHC: 32.7 g/dL (ref 30.0–36.0)
MCV: 96.6 fL (ref 78.0–100.0)
MONO ABS: 0.3 10*3/uL (ref 0.1–1.0)
MONOS PCT: 8 % (ref 3–12)
MPV: 9.7 fL (ref 8.6–12.4)
NEUTROS PCT: 45 % (ref 43–77)
Neutro Abs: 1.9 10*3/uL (ref 1.7–7.7)
Platelets: 256 10*3/uL (ref 150–400)
RBC: 4.46 MIL/uL (ref 3.87–5.11)
RDW: 13.4 % (ref 11.5–15.5)
WBC: 4.2 10*3/uL (ref 4.0–10.5)

## 2014-11-16 LAB — COMPREHENSIVE METABOLIC PANEL
ALBUMIN: 3.9 g/dL (ref 3.5–5.2)
ALT: 22 U/L (ref 0–35)
AST: 24 U/L (ref 0–37)
Alkaline Phosphatase: 58 U/L (ref 39–117)
BUN: 16 mg/dL (ref 6–23)
CALCIUM: 8.8 mg/dL (ref 8.4–10.5)
CHLORIDE: 105 meq/L (ref 96–112)
CO2: 27 meq/L (ref 19–32)
Creat: 0.65 mg/dL (ref 0.50–1.10)
Glucose, Bld: 84 mg/dL (ref 70–99)
Potassium: 4.2 mEq/L (ref 3.5–5.3)
SODIUM: 140 meq/L (ref 135–145)
TOTAL PROTEIN: 6.2 g/dL (ref 6.0–8.3)
Total Bilirubin: 0.5 mg/dL (ref 0.2–1.2)

## 2014-11-16 LAB — TSH: TSH: 2.18 u[IU]/mL (ref 0.350–4.500)

## 2014-11-20 ENCOUNTER — Ambulatory Visit (INDEPENDENT_AMBULATORY_CARE_PROVIDER_SITE_OTHER): Payer: BC Managed Care – PPO | Admitting: Internal Medicine

## 2014-11-20 ENCOUNTER — Encounter: Payer: Self-pay | Admitting: Internal Medicine

## 2014-11-20 VITALS — BP 128/76 | HR 68 | Ht 63.5 in | Wt 156.4 lb

## 2014-11-20 DIAGNOSIS — I483 Typical atrial flutter: Secondary | ICD-10-CM

## 2014-11-20 DIAGNOSIS — I480 Paroxysmal atrial fibrillation: Secondary | ICD-10-CM

## 2014-11-20 DIAGNOSIS — I4581 Long QT syndrome: Secondary | ICD-10-CM | POA: Diagnosis not present

## 2014-11-20 DIAGNOSIS — I469 Cardiac arrest, cause unspecified: Secondary | ICD-10-CM | POA: Diagnosis not present

## 2014-11-20 DIAGNOSIS — Z95810 Presence of automatic (implantable) cardiac defibrillator: Secondary | ICD-10-CM

## 2014-11-20 LAB — MDC_IDC_ENUM_SESS_TYPE_INCLINIC
Battery Remaining Longevity: 120 mo
Battery Voltage: 3.02 V
Brady Statistic AP VP Percent: 0.01 %
Brady Statistic AP VS Percent: 19.18 %
Date Time Interrogation Session: 20160418160701
HIGH POWER IMPEDANCE MEASURED VALUE: 62 Ohm
HighPow Impedance: 190 Ohm
HighPow Impedance: 46 Ohm
Lead Channel Pacing Threshold Amplitude: 0.5 V
Lead Channel Pacing Threshold Pulse Width: 0.4 ms
Lead Channel Sensing Intrinsic Amplitude: 0.75 mV
Lead Channel Sensing Intrinsic Amplitude: 7.375 mV
Lead Channel Setting Pacing Pulse Width: 0.4 ms
Lead Channel Setting Sensing Sensitivity: 0.3 mV
MDC IDC MSMT LEADCHNL RA IMPEDANCE VALUE: 513 Ohm
MDC IDC MSMT LEADCHNL RA PACING THRESHOLD PULSEWIDTH: 0.4 ms
MDC IDC MSMT LEADCHNL RV IMPEDANCE VALUE: 475 Ohm
MDC IDC MSMT LEADCHNL RV PACING THRESHOLD AMPLITUDE: 1 V
MDC IDC SET LEADCHNL RA PACING AMPLITUDE: 2 V
MDC IDC SET LEADCHNL RV PACING AMPLITUDE: 2.5 V
MDC IDC SET ZONE DETECTION INTERVAL: 350 ms
MDC IDC STAT BRADY AS VP PERCENT: 0.05 %
MDC IDC STAT BRADY AS VS PERCENT: 80.76 %
MDC IDC STAT BRADY RA PERCENT PACED: 19.19 %
MDC IDC STAT BRADY RV PERCENT PACED: 0.06 %
Zone Setting Detection Interval: 250 ms
Zone Setting Detection Interval: 300 ms
Zone Setting Detection Interval: 450 ms

## 2014-11-20 NOTE — Assessment & Plan Note (Signed)
She is asymptomatic. No change in medical therapy. Will follow.

## 2014-11-20 NOTE — Assessment & Plan Note (Signed)
She is maintaining NSR and has had no recurrent episodes in the past 2 months. Will follow.

## 2014-11-20 NOTE — Patient Instructions (Signed)
Medication Instructions:  Your physician recommends that you continue on your current medications as directed. Please refer to the Current Medication list given to you today.   Labwork: None today  Testing/Procedures: None today  Follow-Up: Your physician wants you to follow-up in: 12 months with Dr. Ladona Ridgel .  You will receive a reminder letter in the mail two months in advance. If you don't receive a letter, please call our office to schedule the follow-up appointment.  Remote monitoring is used to monitor your Pacemaker or ICD from home. This monitoring reduces the number of office visits required to check your device to one time per year. It allows [place] to keep an eye on the functioning of your device to ensure it is working properly. You are scheduled for a device check from home on February 19, 2015. You may send your transmission at any time that day. If you have a wireless device, the transmission will be sent automatically. After your physician reviews your transmission, you will receive a postcard with your next transmission date.

## 2014-11-20 NOTE — Assessment & Plan Note (Signed)
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 Will recheck in several months.

## 2014-11-20 NOTE — Progress Notes (Signed)
HPI [name] returns today for followup. She is a pleasant 49 yo woman with a h/o VF, s/p ICD implant, as well as HTN and very brief episodes of atrial fibrillation. In the interim she has had a single episode of atrial flutter for which she was paced back to NSR.  She exercises regularly. She has had no recent ICD shocks. No Known Allergies   Current Outpatient Prescriptions  Medication Sig Dispense Refill  . diphenhydramine-acetaminophen (TYLENOL PM) 25-500 MG TABS Take 2 tablets by mouth at bedtime as needed.    Marland Kitchen ibuprofen (ADVIL,MOTRIN) 200 MG tablet Take 200 mg by mouth every 6 (six) hours as needed. For pain    . metoprolol succinate (TOPROL-XL) 50 MG 24 hr tablet TAKE 1/2 TABLET EVERY MORNING AND 1 TABLET EVERY NIGHT AT BEDTIME 135 tablet 3  . mexiletine (MEXITIL) 150 MG capsule Take 1 capsule (150 mg total) by mouth 2 (two) times daily. 180 capsule 3  . potassium chloride SA (K-DUR,KLOR-CON) 20 MEQ tablet TAKE 2 TABLETS BY MOUTH TWICE DAILY 360 tablet 3  . tranexamic acid (LYSTEDA) 650 MG TABS tablet Take 1,300 mg by mouth 3 (three) times daily as needed (x 5 days during menstrual cycle for excessive bleeding.).     No current facility-administered medications for this visit.     Past Medical History  Diagnosis Date  . Ventricular fibrillation   . Symptomatic bradycardia   . Long Q-T syndrome   . Atrial flutter     ROS:   All systems reviewed and negative except as noted in the HPI.   Past Surgical History  Procedure Laterality Date  . Implantable cardioverter defibrillator implant  2009; 11-18-13    MDT single chamber ICD implanted by Dr Ladona Ridgel 2009; upgrade to dual chamber MDT ICD 11/2013 by Dr Ladona Ridgel  . Tympanostomy tube placement    . Dilation and curettage of uterus  1999  . Cesarean section  1994  . Adenoidectomy    . Intrauterine device insertion  February 13, 2011    PARAGUARD   . Implantable cardioverter defibrillator (icd) generator change N/A  11/18/2013    Procedure: ICD GENERATOR CHANGE;  Surgeon: Marinus Maw, MD;  Location[facility] CATH [facility];  Service: Cardiovascular;  Laterality: N/A;     Family History  Problem Relation Age of Onset  . Hypertension Father   . Heart disease Father     PACEMAKER INSERTED  . Diabetes Maternal Grandmother   . Cancer Maternal Grandmother     COLON CANCER     History   Social History  . Marital Status: Married    Spouse Name: N/A  . Number of Children: N/A  . Years of Education: N/A   Occupational History  . Not on file.   Social History Main Topics  . Smoking status: Never Smoker   . Smokeless tobacco: Never Used  . Alcohol Use: Yes     Comment: occasionally  . Drug Use: No  . Sexual Activity:    Partners: Male    Birth Control/ Protection: IUD     Comment: PARAGARD   Other Topics Concern  . Not on file   Social History Narrative     BP 128/76 mmHg  Pulse 68  Ht 5' 3.5" (1.613 m)  Wt 156 lb 6.4 oz (70.943 kg)  BMI 27.27 kg/m2  SpO2 99%  LMP 10/16/2014  Physical Exam:  Well appearing 49yo woman, NAD HEENT: Unremarkable Neck:  6 cm JVD, no  thyromegally Back:  No CVA tenderness Lungs:  Clear with no wheezes, well healed ICD implant. HEART:  Regular rate rhythm, no murmurs, no rubs, no clicks Abd:  soft, positive bowel sounds, no organomegally, no rebound, no guarding Ext:  2 plus pulses, no edema, no cyanosis, no clubbing Skin:  No rashes no nodules Neuro:  CN II through XII intact, motor grossly intact   DEVICE  Normal device function.  See PaceArt for details.   Assess/Plan:

## 2014-11-23 ENCOUNTER — Ambulatory Visit (INDEPENDENT_AMBULATORY_CARE_PROVIDER_SITE_OTHER): Payer: BC Managed Care – PPO | Admitting: Gynecology

## 2014-11-23 ENCOUNTER — Other Ambulatory Visit: Payer: Self-pay | Admitting: Gynecology

## 2014-11-23 ENCOUNTER — Ambulatory Visit (INDEPENDENT_AMBULATORY_CARE_PROVIDER_SITE_OTHER): Payer: BC Managed Care – PPO

## 2014-11-23 ENCOUNTER — Encounter: Payer: Self-pay | Admitting: Gynecology

## 2014-11-23 ENCOUNTER — Ambulatory Visit
Admission: RE | Admit: 2014-11-23 | Discharge: 2014-11-23 | Disposition: A | Discharge status: Home / Self Care | Payer: BC Managed Care – PPO | Source: Ambulatory Visit

## 2014-11-23 VITALS — BP 126/78

## 2014-11-23 DIAGNOSIS — N8300 Follicular cyst of ovary, unspecified side: Secondary | ICD-10-CM

## 2014-11-23 DIAGNOSIS — N8320 Unspecified ovarian cysts: Secondary | ICD-10-CM | POA: Diagnosis not present

## 2014-11-23 DIAGNOSIS — N7011 Chronic salpingitis: Secondary | ICD-10-CM

## 2014-11-23 DIAGNOSIS — N951 Menopausal and female climacteric states: Secondary | ICD-10-CM

## 2014-11-23 DIAGNOSIS — N83201 Unspecified ovarian cyst, right side: Secondary | ICD-10-CM

## 2014-11-23 DIAGNOSIS — E7800 Pure hypercholesterolemia, unspecified: Secondary | ICD-10-CM

## 2014-11-23 DIAGNOSIS — Z1231 Encounter for screening mammogram for malignant neoplasm of breast: Secondary | ICD-10-CM

## 2014-11-24 DIAGNOSIS — N83201 Unspecified ovarian cyst, right side: Secondary | ICD-10-CM | POA: Insufficient documentation

## 2014-11-24 LAB — FOLLICLE STIMULATING HORMONE: FSH: 22.3 m[IU]/mL

## 2014-11-24 NOTE — Progress Notes (Signed)
   49 y/o who was seen for her gyn annual exam on 11/13/2014. Patient with perimenopause symptoms. Had [facility] which was in the early perimenopause range today.  Patient with past history of right hydrosalpinx noted on ultrasound in 2014 and past history of ovarian cysts.  Review of her record indicated that patient has a history of ventricular fibrillation, patient has a pacemaker(implantable defibrillator) in place as a result of her long QT interval and has been under the care of her cardiologist Dr. Ladona Ridgel. She follows up with her cardiologist every 6 months. Patient had a ParaGard T380A IUD placed in 2012. She has suffer from menorrhagia in the past and responded to Lysteda 650 mg 2 tablets by mouth 3 times a day for 5 days of her cycle.  Patient is here for f/u ultrasound on right suspected hydrosalpinx.  U/S: 09/2012: IUD in cavity Right ovary 2 mm follicle Left ovary 18 mm follicle Left adnexa 4.5 x 1.4 serpentinite like structure  U/S today: Uterus: 6.8 x 5.1 x 4.1 IUD in normal position Presence of tubular serpentine thick walled mass:6.4 x 2.5 x 4.4  avascular Left ovary thin walled echo free cyst 1.9 x 1.8 cm thin wall cyst with internal low level echos 13 x 11 mm avascular No fluid in CDS  A/P: Enlarging adnexal mass right highly suspicious for hydrosalpinx and small benign appear cyst  on left ovary. Discussed options of removal laparoscopically as outpatient surgical procedure. Patient would like to proceed in June when she returns back from vacation.Due to patients past cardiac history:  Prior cardiac arrest Atrial fibrillation Atrial Flutter Automatic implantable cardioverter defibrillator  Will obtain the following consults prior to pre op visit week before surgery in June:  1. Cardiologist Dr. Ladona Ridgel for clearance and recommendations 2. Consult with Anesthesiology team at [facility]  Planned surgery: Laparoscopic LSO/Right salpingectomy possible RSO, possible  Laparotomy.  More than 50% of the time was discussing finding and treatment options and coordinating care/consults/preop preparations

## 2014-11-27 ENCOUNTER — Telehealth: Payer: Self-pay | Admitting: *Deleted

## 2014-11-27 ENCOUNTER — Telehealth: Payer: Self-pay

## 2014-11-27 NOTE — Telephone Encounter (Signed)
Of note, I did speak with patient about need for Cardiology clearance and she will take care of that. I told her if she needed any assistance from me to let me know. She did not seem to think it would be needed.

## 2014-11-27 NOTE — Telephone Encounter (Signed)
Tell her I would like to see her next week to discuss options such as change the IUD from [facility] which she has to a Mirena or add hysterectomy to her surgery in June and different routes for her surgery. Make sure cardiology consult has been set.

## 2014-11-27 NOTE — Telephone Encounter (Signed)
Patient called c/o vaginal spotting only x 6 weeks now, pt said you are fully aware of this issue.  States you spoke with her about Rx if this should continue to happen? Please advise

## 2014-11-27 NOTE — Telephone Encounter (Signed)
I called patient because Dr. Glenetta Hew sent me OR request for Lap RSO, Left Salpingectomy, Possible Laparotomy. The request was for first week of June but when I called patient about June 7 date she said she will still be in school. She requested after July 4th.  I do not have July schedule yet but will tentatively hold time for her on July 12 as I do know that each doctor is off on a week on either side of this date.   I will call her when I receive final July schedule and confirm.

## 2014-11-27 NOTE — Telephone Encounter (Signed)
ok 

## 2014-11-27 NOTE — Telephone Encounter (Signed)
Pt left message in triage c/o bleeding, I left message for pt to call.

## 2014-11-28 NOTE — Telephone Encounter (Signed)
Great.  Thanks

## 2014-11-28 NOTE — Telephone Encounter (Signed)
Dr.Fernadez pt said the mirena IUD will be fine it all depends on the cost, I will ask wendy to check benefits and let her know the cost if affordable for pt she will call to schedule consult with you.

## 2014-12-05 ENCOUNTER — Other Ambulatory Visit: Payer: Self-pay | Admitting: Internal Medicine

## 2014-12-08 ENCOUNTER — Telehealth: Payer: Self-pay | Admitting: Gynecology

## 2014-12-08 NOTE — Telephone Encounter (Signed)
12/08/14-I LM VM for pt that her [facility] insurance will cover the Mirena & insertion for contraception under her $39.00 copay. She will call if wants to proceed with insertion while on cycle.wl

## 2014-12-12 ENCOUNTER — Telehealth: Payer: Self-pay | Admitting: Internal Medicine

## 2014-12-12 ENCOUNTER — Telehealth: Payer: Self-pay

## 2014-12-12 NOTE — Telephone Encounter (Signed)
Patient said she contacted Dr. Reece Leader office to see about getting medical clearance from surgery. She was told that our office would have to call.  I placed a call today and left a message with lady who answered who said she would send it to Dr. Ladona Ridgel. I left patient a message that I had placed a call to that office and will let her know when I hear back from her.

## 2014-12-12 NOTE — Telephone Encounter (Signed)
New message    Notes in epic    Request for surgical clearance:  1. What type of surgery is being performed? Lap RFO-  2. [facility]   3. Are there any medications that need to be held prior to surgery and how long? Office unsure   Name of physician performing surgery? Dr. Julious Oka office     4. What is your office phone and fax number? [phone] / fx [phone]

## 2014-12-12 NOTE — Telephone Encounter (Signed)
Dr. Ladona Ridgel reviewed, says low risk from a cardiovascular standpoint and okay to proceed with surgery

## 2014-12-13 NOTE — Telephone Encounter (Signed)
Dr. Ladona Ridgel put note in [facility] that patient cleared for surgery. Patient and Dr. Glenetta Hew notified.

## 2015-01-09 ENCOUNTER — Encounter: Payer: Self-pay | Admitting: Gynecology

## 2015-01-19 ENCOUNTER — Encounter: Payer: Self-pay | Admitting: Gynecology

## 2015-01-19 ENCOUNTER — Telehealth: Payer: Self-pay | Admitting: Gynecology

## 2015-01-19 ENCOUNTER — Telehealth: Payer: Self-pay

## 2015-01-19 ENCOUNTER — Ambulatory Visit (INDEPENDENT_AMBULATORY_CARE_PROVIDER_SITE_OTHER): Payer: BC Managed Care – PPO | Admitting: Gynecology

## 2015-01-19 VITALS — BP 118/70

## 2015-01-19 DIAGNOSIS — N8320 Unspecified ovarian cysts: Secondary | ICD-10-CM | POA: Diagnosis not present

## 2015-01-19 DIAGNOSIS — Z01818 Encounter for other preprocedural examination: Secondary | ICD-10-CM | POA: Diagnosis not present

## 2015-01-19 DIAGNOSIS — N7011 Chronic salpingitis: Secondary | ICD-10-CM

## 2015-01-19 DIAGNOSIS — N83202 Unspecified ovarian cyst, left side: Secondary | ICD-10-CM

## 2015-01-19 MED ORDER — METOCLOPRAMIDE HCL 10 MG PO TABS: 10.0000 mg | ORAL_TABLET | Freq: Three times a day (TID) | ORAL | Status: DC

## 2015-01-19 MED ORDER — OXYCODONE-ACETAMINOPHEN 7.5-325 MG PO TABS: 1.0000 | ORAL_TABLET | ORAL | Status: DC | PRN

## 2015-01-19 NOTE — Patient Instructions (Signed)

## 2015-01-19 NOTE — Progress Notes (Signed)
 [name] is an 49 y.o. female. For her preop examination. Patient scheduled July 12 2 undergo bilateral salpingectomy as a result of right hydrosalpinx. Possible left ovarian cystectomy for small ovarian cyst if present at time of laparoscopy.  Patient's history as follows: seen for her gyn annual exam on 11/13/2014. Patient with perimenopause symptoms. Had FSH which was in the early perimenopause range today.  Patient with past history of right hydrosalpinx noted on ultrasound in 2014 and past history of ovarian cysts.  Review of her record indicated that patient has a history of ventricular fibrillation, patient has a pacemaker(implantable defibrillator) in place as a result of her long QT interval and has been under the care of her cardiologist Dr. Taylor. She follows up with her cardiologist every 6 months. Patient had a ParaGard T380A IUD placed in 2012. She has suffer from menorrhagia in the past and responded to Lysteda 650 mg 2 tablets by mouth 3 times a day for 5 days of her cycle. Patient's cardiologist has cleared her for surgery.  Patient is here for f/u ultrasound on right suspected hydrosalpinx.  U/S: 09/2012: IUD in cavity Right ovary 2 mm follicle Left ovary 18 mm follicle Right adnexa 4.5 x 1.4 serpentinite like structure  U/S today: Uterus: 6.8 x 5.1 x 4.1 IUD in normal position Presence of tubular serpentine thick walled mass:6.4 x 2.5 x 4.4 avascular (right) Left ovary thin walled echo free cyst 1.9 x 1.8 cm thin wall cyst with internal low level echos 13 x 11 mm avascular No fluid in CDS  Pertinent Gynecological History: Menses: irregular Bleeding: same as above  Contraception: IUD DES exposure: unknown Blood transfusions: none Sexually transmitted diseases: no past history Previous GYN Procedures: 3 nsvd, 1 csection  Last mammogram: normal Date: 2016  Last pap: normal Date: 2016 OB History: G5, P4 1 SAB   Menstrual History: Menarche age:  12 Patient's last menstrual period was 12/29/2014.    Past Medical History  Diagnosis Date  . Ventricular fibrillation   . Symptomatic bradycardia   . Long Q-T syndrome   . Atrial flutter     Past Surgical History  Procedure Laterality Date  . Implantable cardioverter defibrillator implant  2009; 11-18-13    MDT single chamber ICD implanted by Dr Taylor 2009; upgrade to dual chamber MDT ICD 11/2013 by Dr Taylor  . Tympanostomy tube placement    . Dilation and curettage of uterus  1999  . Cesarean section  1994  . Adenoidectomy    . Intrauterine device insertion  February 13, 2011    PARAGUARD   . Implantable cardioverter defibrillator (icd) generator change N/A 11/18/2013    Procedure: ICD GENERATOR CHANGE;  Surgeon: Gregg W Taylor, MD;  Location: [facility] CATH LAB;  Service: Cardiovascular;  Laterality: N/A;    Family History  Problem Relation Age of Onset  . Hypertension Father   . Heart disease Father     PACEMAKER INSERTED  . Diabetes Maternal Grandmother   . Cancer Maternal Grandmother     COLON CANCER    Social History:  reports that she has never smoked. She has never used smokeless tobacco. She reports that she drinks alcohol. She reports that she does not use illicit drugs.  Allergies: No Known Allergies   (Not in a hospital admission)  REVIEW OF SYSTEMS: A ROS was performed and pertinent positives and negatives are included in the history.  GENERAL: No fevers or chills. HEENT: No change in vision, no earache, sore   throat or sinus congestion. NECK: No pain or stiffness. CARDIOVASCULAR: No chest pain or pressure. No palpitations. PULMONARY: No shortness of breath, cough or wheeze. GASTROINTESTINAL: No abdominal pain, nausea, vomiting or diarrhea, melena or bright red blood per rectum. GENITOURINARY: No urinary frequency, urgency, hesitancy or dysuria. MUSCULOSKELETAL: No joint or muscle pain, no back pain, no recent trauma. DERMATOLOGIC: No rash, no itching, no lesions.  ENDOCRINE: No polyuria, polydipsia, no heat or cold intolerance. No recent change in weight. HEMATOLOGICAL: No anemia or easy bruising or bleeding. NEUROLOGIC: No headache, seizures, numbness, tingling or weakness. PSYCHIATRIC: No depression, no loss of interest in normal activity or change in sleep pattern.     Blood pressure 118/70, last menstrual period 12/29/2014.  Physical Exam:  HEENT:unremarkable Neck:Supple, midline, no thyroid megaly, no carotid bruits Lungs:  Clear to auscultation no rhonchi's or wheezes Heart:Regular rate and rhythm, no murmurs or gallops Breast Exam: Not examined Abdomen: Soft nontender no rebound or guarding Pelvic:BUS within normal limits Vagina: No lesions or discharge Cervix: No lesions or discharge IUD string seen Uterus: Retroverted upper limits of normal no masses or tenderness Adnexa: Fullness in right adnexa nontender Extremities: No cords, no edema Rectal: Not examined    Assessment/Plan: Scheduled to undergo laparoscopic bilateral salpingectomy as a result of persistent large right hydrosalpinx and a small ovarian cyst that may require ovarian cystectomy at the time of present. Patient was cleared by her cardiologist. Patient was provided with prescription for Percocet and Reglan for her to have which she gets home from surgery. Risk for surgery as discussed below:                        Patient was counseled as to the risk of surgery to include the following:  1. Infection (prohylactic antibiotics will be administered)  2. DVT/Pulmonary Embolism (prophylactic pneumo compression stockings will be used)  3.Trauma to internal organs requiring additional surgical procedure to repair any injury to     Internal organs requiring perhaps additional hospitalization days.  4.Hemmorhage requiring transfusion and blood products which carry risks such as             anaphylactic reaction, hepatitis and AIDS  Patient had received literature information  on the procedure scheduled and all her questions were answered and fully accepts all risk.   [facility] [id number]:49 AMTD@Note :      Ok Edwards 01/19/2015, 11:10 AM  Note: This dictation was prepared with  Dragon/digital dictation along withSmart phrase technology. Any transcriptional errors that result from this process are unintentional.

## 2015-01-19 NOTE — Telephone Encounter (Signed)
01/19/15-Pt was advised today that she would have a $39 copay to remove existing Paraguard and insert new Mirena for contraception.wl Appt is already made with JF.wl

## 2015-01-19 NOTE — Telephone Encounter (Signed)
Patient called to ask regarding what time for [facility] check-in, labs etc. I told her [facility] will be in touch with her to set up pre-op and will give her all of those instructions.

## 2015-02-06 NOTE — Patient Instructions (Addendum)
  Your procedure is scheduled on:  February 13, 2015  Enter through the Main Entrance of [facility] at:  0900 am   Pick up the phone at the desk and dial [phone].  Call this number if you have problems the morning of surgery: [phone].  Remember: Do NOT eat food: after midnight on Monday  Do NOT drink clear liquids after:  Midnight on Monday  Take these medicines the morning of surgery with a SIP OF WATER:  toprol-xl, mexiletine, and potassium chloride SA   Do NOT wear jewelry (body piercing), metal hair clips/bobby pins, make-up, or nail polish. Do NOT wear lotions, powders, or perfumes.  You may wear deoderant. Do NOT shave for 48 hours prior to surgery. Do NOT bring valuables to the hospital. Have a responsible adult drive you home and stay with you for 24 hours after your procedure

## 2015-02-07 ENCOUNTER — Encounter (HOSPITAL_COMMUNITY): Payer: Self-pay

## 2015-02-07 ENCOUNTER — Encounter (HOSPITAL_COMMUNITY)
Admission: RE | Admit: 2015-02-07 | Discharge: 2015-02-07 | Disposition: A | Discharge status: Home / Self Care | Payer: BC Managed Care – PPO | Source: Ambulatory Visit | Attending: Gynecology | Admitting: Gynecology

## 2015-02-07 ENCOUNTER — Other Ambulatory Visit: Payer: Self-pay

## 2015-02-07 DIAGNOSIS — N8320 Unspecified ovarian cysts: Secondary | ICD-10-CM | POA: Insufficient documentation

## 2015-02-07 DIAGNOSIS — Z01818 Encounter for other preprocedural examination: Secondary | ICD-10-CM | POA: Diagnosis present

## 2015-02-07 HISTORY — DX: Presence of cardiac pacemaker: Z95.0

## 2015-02-07 HISTORY — DX: Presence of automatic (implantable) cardiac defibrillator: Z95.810

## 2015-02-07 HISTORY — DX: Acute myocardial infarction, unspecified: I21.9

## 2015-02-07 LAB — BASIC METABOLIC PANEL
Anion gap: 4 — ABNORMAL LOW (ref 5–15)
BUN: 15 mg/dL (ref 6–20)
CO2: 31 mmol/L (ref 22–32)
Calcium: 9.3 mg/dL (ref 8.9–10.3)
Chloride: 104 mmol/L (ref 101–111)
Creatinine, Ser: 0.75 mg/dL (ref 0.44–1.00)
Glucose, Bld: 80 mg/dL (ref 65–99)
Potassium: 4 mmol/L (ref 3.5–5.1)
SODIUM: 139 mmol/L (ref 135–145)

## 2015-02-07 LAB — URINALYSIS, ROUTINE W REFLEX MICROSCOPIC
Bilirubin Urine: NEGATIVE
Glucose, UA: NEGATIVE mg/dL
HGB URINE DIPSTICK: NEGATIVE
KETONES UR: NEGATIVE mg/dL
Leukocytes, UA: NEGATIVE
Nitrite: NEGATIVE
PH: 5.5 (ref 5.0–8.0)
PROTEIN: NEGATIVE mg/dL
Specific Gravity, Urine: 1.02 (ref 1.005–1.030)
Urobilinogen, UA: 0.2 mg/dL (ref 0.0–1.0)

## 2015-02-07 LAB — CBC
HEMATOCRIT: 44 % (ref 36.0–46.0)
HEMOGLOBIN: 14.8 g/dL (ref 12.0–15.0)
MCH: 32.5 pg (ref 26.0–34.0)
MCHC: 33.6 g/dL (ref 30.0–36.0)
MCV: 96.5 fL (ref 78.0–100.0)
Platelets: 217 10*3/uL (ref 150–400)
RBC: 4.56 MIL/uL (ref 3.87–5.11)
RDW: 13 % (ref 11.5–15.5)
WBC: 4.4 10*3/uL (ref 4.0–10.5)

## 2015-02-07 NOTE — Anesthesia Preprocedure Evaluation (Addendum)
Anesthesia Evaluation  Patient identified by MRN, date of birth, ID band Patient awake    Reviewed: Allergy & Precautions, NPO status , Patient's Chart, lab work & pertinent test results  History of Anesthesia Complications Negative for: history of anesthetic complications  Airway Mallampati: II  TM Distance: >3 FB Neck ROM: Full    Dental no notable dental hx. (+) Dental Advisory Given   Pulmonary neg pulmonary ROS,  breath sounds clear to auscultation  Pulmonary exam normal       Cardiovascular Exercise Tolerance: Good hypertension, Pt. on medications and Pt. on home beta blockers Normal cardiovascular exam+ dysrhythmias Atrial Fibrillation + pacemaker + Cardiac Defibrillator Rhythm:Regular Rate:Normal  Dr. Ladona Ridgel reviewed, says low risk from a cardiovascular standpoint and okay to proceed with surgery. Will obtain orders for AICD. Orders from cardiology note that no magnet is necessary or interrogation post procedure. Hx of Long QT syndrome s/p arrest and subsequent AICD placement   Neuro/Psych negative neurological ROS  negative psych ROS   GI/Hepatic negative GI ROS, Neg liver ROS,   Endo/Other  negative endocrine ROS  Renal/GU negative Renal ROS  negative genitourinary   Musculoskeletal negative musculoskeletal ROS (+)   Abdominal   Peds negative pediatric ROS (+)  Hematology negative hematology ROS (+)   Anesthesia Other Findings   Reproductive/Obstetrics negative OB ROS                           Anesthesia Physical Anesthesia Plan  ASA: III  Anesthesia Plan: General   Post-op Pain Management:    Induction: Intravenous  Airway Management Planned: Oral ETT  Additional Equipment:   Intra-op Plan:   Post-operative Plan: Extubation in OR  Informed Consent: I have reviewed the patients History and Physical, chart, labs and discussed the procedure including the risks,  benefits and alternatives for the proposed anesthesia with the patient or authorized representative who has indicated his/her understanding and acceptance.   Dental advisory given  Plan Discussed with: CRNA  Anesthesia Plan Comments: (Plan for TIVA, using sugammadex for reversal, avoiding sympathetic stimulation with a deep plane of anesthesia and opioids, avoiding medications that prolong QT, plan to follow AICD prescription)       Anesthesia Quick Evaluation

## 2015-02-12 ENCOUNTER — Other Ambulatory Visit (HOSPITAL_COMMUNITY): Payer: BC Managed Care – PPO

## 2015-02-12 MED ORDER — DEXTROSE 5 % IV SOLN
2.0000 g | INTRAVENOUS | Status: AC
  Administered 2015-02-13: 2 g via INTRAVENOUS
  Filled 2015-02-12: qty 2

## 2015-02-13 ENCOUNTER — Encounter (HOSPITAL_COMMUNITY)
Admission: RE | Disposition: A | Discharge status: Home / Self Care | Payer: Self-pay | Source: Ambulatory Visit | Attending: Gynecology

## 2015-02-13 ENCOUNTER — Encounter (HOSPITAL_COMMUNITY): Payer: Self-pay | Admitting: Anesthesiology

## 2015-02-13 ENCOUNTER — Ambulatory Visit (HOSPITAL_COMMUNITY)
Admission: RE | Admit: 2015-02-13 | Discharge: 2015-02-13 | Disposition: A | Discharge status: Home / Self Care | Payer: BC Managed Care – PPO | Source: Ambulatory Visit | Attending: Gynecology | Admitting: Gynecology

## 2015-02-13 ENCOUNTER — Ambulatory Visit (HOSPITAL_COMMUNITY): Payer: BC Managed Care – PPO | Admitting: Anesthesiology

## 2015-02-13 DIAGNOSIS — N7011 Chronic salpingitis: Secondary | ICD-10-CM | POA: Diagnosis not present

## 2015-02-13 DIAGNOSIS — N802 Endometriosis of fallopian tube: Secondary | ICD-10-CM | POA: Insufficient documentation

## 2015-02-13 DIAGNOSIS — N8320 Unspecified ovarian cysts: Secondary | ICD-10-CM | POA: Insufficient documentation

## 2015-02-13 DIAGNOSIS — Z950 Presence of cardiac pacemaker: Secondary | ICD-10-CM | POA: Diagnosis not present

## 2015-02-13 DIAGNOSIS — Z975 Presence of (intrauterine) contraceptive device: Secondary | ICD-10-CM | POA: Insufficient documentation

## 2015-02-13 HISTORY — PX: BILATERAL SALPINGECTOMY: SHX5743

## 2015-02-13 HISTORY — PX: OOPHORECTOMY: SHX6387

## 2015-02-13 HISTORY — PX: LAPAROSCOPIC BILATERAL SALPINGECTOMY: SHX5889

## 2015-02-13 LAB — PREGNANCY, URINE: PREG TEST UR: NEGATIVE

## 2015-02-13 SURGERY — SALPINGECTOMY, BILATERAL, OPEN
Anesthesia: General | Site: Abdomen | Laterality: Right

## 2015-02-13 MED ORDER — MIDAZOLAM HCL 2 MG/2ML IJ SOLN
INTRAMUSCULAR | Status: AC
  Filled 2015-02-13: qty 2

## 2015-02-13 MED ORDER — FENTANYL CITRATE (PF) 100 MCG/2ML IJ SOLN
INTRAMUSCULAR | Status: AC
  Administered 2015-02-13: 25 ug via INTRAVENOUS
  Filled 2015-02-13: qty 2

## 2015-02-13 MED ORDER — PROPOFOL 10 MG/ML IV BOLUS
INTRAVENOUS | Status: DC | PRN
  Administered 2015-02-13: 130 mg via INTRAVENOUS

## 2015-02-13 MED ORDER — PROPOFOL INFUSION 10 MG/ML OPTIME
INTRAVENOUS | Status: DC | PRN
  Administered 2015-02-13: 120 ug/kg/min via INTRAVENOUS

## 2015-02-13 MED ORDER — EPHEDRINE SULFATE 50 MG/ML IJ SOLN
INTRAMUSCULAR | Status: DC | PRN
  Administered 2015-02-13 (×2): 5 mg via INTRAVENOUS

## 2015-02-13 MED ORDER — LACTATED RINGERS IV SOLN
INTRAVENOUS | Status: DC
  Administered 2015-02-13 (×2): via INTRAVENOUS

## 2015-02-13 MED ORDER — LIDOCAINE HCL (CARDIAC) 20 MG/ML IV SOLN
INTRAVENOUS | Status: AC
  Filled 2015-02-13: qty 5

## 2015-02-13 MED ORDER — FENTANYL CITRATE (PF) 100 MCG/2ML IJ SOLN
25.0000 ug | INTRAMUSCULAR | Status: DC | PRN
  Administered 2015-02-13: 25 ug via INTRAVENOUS

## 2015-02-13 MED ORDER — ROCURONIUM BROMIDE 100 MG/10ML IV SOLN
INTRAVENOUS | Status: AC
  Filled 2015-02-13: qty 1

## 2015-02-13 MED ORDER — HEPARIN SODIUM (PORCINE) 5000 UNIT/ML IJ SOLN
INTRAMUSCULAR | Status: AC
Start: 2015-02-13 — End: 2015-02-13
  Filled 2015-02-13: qty 1

## 2015-02-13 MED ORDER — PROPOFOL 10 MG/ML IV BOLUS
INTRAVENOUS | Status: AC
  Filled 2015-02-13: qty 60

## 2015-02-13 MED ORDER — LACTATED RINGERS IR SOLN
Status: DC | PRN
  Administered 2015-02-13: 3000 mL

## 2015-02-13 MED ORDER — SUGAMMADEX SODIUM 500 MG/5ML IV SOLN
INTRAVENOUS | Status: DC | PRN
  Administered 2015-02-13: 140.6 mg via INTRAVENOUS

## 2015-02-13 MED ORDER — BUPIVACAINE HCL (PF) 0.25 % IJ SOLN
INTRAMUSCULAR | Status: DC | PRN
  Administered 2015-02-13: 20 mL

## 2015-02-13 MED ORDER — EPHEDRINE 5 MG/ML INJ
INTRAVENOUS | Status: AC
Start: 2015-02-13 — End: 2015-02-13
  Filled 2015-02-13: qty 10

## 2015-02-13 MED ORDER — FENTANYL CITRATE (PF) 250 MCG/5ML IJ SOLN
INTRAMUSCULAR | Status: AC
  Filled 2015-02-13: qty 5

## 2015-02-13 MED ORDER — MIDAZOLAM HCL 2 MG/2ML IJ SOLN
INTRAMUSCULAR | Status: DC | PRN
  Administered 2015-02-13: 2 mg via INTRAVENOUS

## 2015-02-13 MED ORDER — ROCURONIUM BROMIDE 100 MG/10ML IV SOLN
INTRAVENOUS | Status: DC | PRN
  Administered 2015-02-13: 10 mg via INTRAVENOUS
  Administered 2015-02-13: 30 mg via INTRAVENOUS
  Administered 2015-02-13: 10 mg via INTRAVENOUS

## 2015-02-13 MED ORDER — LIDOCAINE HCL (CARDIAC) 20 MG/ML IV SOLN
INTRAVENOUS | Status: DC | PRN
  Administered 2015-02-13: 100 mg via INTRAVENOUS

## 2015-02-13 MED ORDER — ACETAMINOPHEN 10 MG/ML IV SOLN
1000.0000 mg | Freq: Once | INTRAVENOUS | Status: AC
  Administered 2015-02-13: 1000 mg via INTRAVENOUS
  Filled 2015-02-13: qty 100

## 2015-02-13 MED ORDER — HEPARIN SODIUM (PORCINE) 5000 UNIT/ML IJ SOLN
INTRAMUSCULAR | Status: DC | PRN
  Administered 2015-02-13: 5000 [IU] via SUBCUTANEOUS

## 2015-02-13 MED ORDER — BUPIVACAINE HCL (PF) 0.25 % IJ SOLN
INTRAMUSCULAR | Status: AC
  Filled 2015-02-13: qty 30

## 2015-02-13 MED ORDER — 0.9 % SODIUM CHLORIDE (POUR BTL) OPTIME
TOPICAL | Status: DC | PRN
  Administered 2015-02-13: 1000 mL

## 2015-02-13 MED ORDER — FENTANYL CITRATE (PF) 100 MCG/2ML IJ SOLN
INTRAMUSCULAR | Status: DC | PRN
  Administered 2015-02-13 (×4): 50 ug via INTRAVENOUS

## 2015-02-13 SURGICAL SUPPLY — 36 items
ADH SKN CLS APL DERMABOND .7 (GAUZE/BANDAGES/DRESSINGS) ×5
BAG SPEC RTRVL LRG 6X4 10 (ENDOMECHANICALS)
BARRIER ADHS 3X4 INTERCEED (GAUZE/BANDAGES/DRESSINGS) IMPLANT
BRR ADH 4X3 ABS CNTRL BYND (GAUZE/BANDAGES/DRESSINGS)
CABLE HIGH FREQUENCY MONO STRZ (ELECTRODE) IMPLANT
CELLS DAT CNTRL 66122 CELL SVR (MISCELLANEOUS) IMPLANT
CLOTH BEACON ORANGE TIMEOUT ST (SAFETY) ×6 IMPLANT
COVER MAYO STAND STRL (DRAPES) ×6 IMPLANT
DERMABOND ADVANCED (GAUZE/BANDAGES/DRESSINGS) ×1
DERMABOND ADVANCED .7 DNX12 (GAUZE/BANDAGES/DRESSINGS) ×5 IMPLANT
DRSG OPSITE POSTOP 3X4 (GAUZE/BANDAGES/DRESSINGS) ×6 IMPLANT
FILTER SMOKE EVAC LAPAROSHD (FILTER) ×6 IMPLANT
GLOVE BIOGEL PI IND STRL 8 (GLOVE) ×5 IMPLANT
GLOVE BIOGEL PI INDICATOR 8 (GLOVE) ×1
GLOVE ECLIPSE 7.5 STRL STRAW (GLOVE) ×12 IMPLANT
GOWN STRL REUS W/TWL LRG LVL3 (GOWN DISPOSABLE) ×12 IMPLANT
LIQUID BAND (GAUZE/BANDAGES/DRESSINGS) IMPLANT
NS IRRIG 1000ML POUR BTL (IV SOLUTION) ×6 IMPLANT
PACK LAPAROSCOPY BASIN (CUSTOM PROCEDURE TRAY) ×6 IMPLANT
POUCH SPECIMEN RETRIEVAL 10MM (ENDOMECHANICALS) IMPLANT
PROTECTOR NERVE ULNAR (MISCELLANEOUS) ×6 IMPLANT
RETRACTOR WND ALEXIS 25 LRG (MISCELLANEOUS) IMPLANT
RTRCTR WOUND ALEXIS 18CM MED (MISCELLANEOUS)
RTRCTR WOUND ALEXIS 25CM LRG (MISCELLANEOUS)
SET IRRIG TUBING LAPAROSCOPIC (IRRIGATION / IRRIGATOR) ×6 IMPLANT
SHEARS HARMONIC ACE PLUS 36CM (ENDOMECHANICALS) IMPLANT
SLEEVE XCEL OPT CAN 5 100 (ENDOMECHANICALS) ×6 IMPLANT
SOLUTION ELECTROLUBE (MISCELLANEOUS) IMPLANT
SUT VIC AB 3-0 PS2 18 (SUTURE) ×6
SUT VIC AB 3-0 PS2 18XBRD (SUTURE) ×5 IMPLANT
SUT VICRYL 0 UR6 27IN ABS (SUTURE) ×6 IMPLANT
TOWEL OR 17X24 6PK STRL BLUE (TOWEL DISPOSABLE) ×12 IMPLANT
TRAY FOLEY CATH SILVER 14FR (SET/KITS/TRAYS/PACK) ×6 IMPLANT
TROCAR XCEL NON-BLD 11X100MML (ENDOMECHANICALS) ×6 IMPLANT
TROCAR XCEL NON-BLD 5MMX100MML (ENDOMECHANICALS) ×6 IMPLANT
WARMER LAPAROSCOPE (MISCELLANEOUS) ×6 IMPLANT

## 2015-02-13 NOTE — Op Note (Signed)
Operative Note  02/13/2015  12:03 PM  PATIENT:  [name]  49 y.o. female  PRE-OPERATIVE DIAGNOSIS:  right adnexal mass, left ovarian cyst  POST-OPERATIVE DIAGNOSIS:  Bilateral hydrosalpinx, endometriosis  PROCEDURE:  Procedure(s): LAPAROSCOPIC BILATERAL SALPINGECTOMY  SURGEON:  Surgeon(s): Ok Edwards, MD Ok Edwards, MD  ANESTHESIA:   general  FINDINGS: Bilateral hydrosalpinx. Simple right ovarian cyst. Endometriosis with obliteration of cul-de-sac. Normal-appearing appendix and smooth liver surface  DESCRIPTION OF OPERATION: The patient was taken to the operating room where she successfully underwent general endotracheal anesthesia. A timeout was undertaken to confirm procedure to be undertaken as well as identification of the patient. Patient received 2 g Cefotan prophylactically IV. PSA stockings were in place for DVT prophylaxis. Patient with IUD in place so a ring forcep was placed in the vagina for manipulation of the uterus after the prep was completed. The abdomen had previously been prepped in usual sterile fashion and a Foley catheter had been inserted an effort to monitor urinary output. Prior to this an exam under anesthesia demonstrated a retroverted uterus and fullness in the right adnexa no fullness on the left adnexa. The drapes were then placed. A small subumbilical incision was made followed by insertion of the 10/11 mm trocar (Optiview). Once confirming entrance into the abdominal cavity and pneumoperitoneum was established with approximately 3 L of carbon dioxide. 2 additional 5 mm ports were introduced into the right and left lower abdomen under laparoscopic guidance. A systematic inspection of the pelvic cavity demonstrated an obliterated cul-de-sac a large right hydrosalpinx a smaller left hydrosalpinx both ovaries appeared to be atrophic. Smooth appendix in appearance as well as gallbladder and liver surface. Attention was placed to the right  adnexa. The right ureter was identified and tension was placed on the right hydrosalpinx and with utilization of the Harmonic Scalpel the nasal salpinx was coaptated and transected to the level of the uterotubal junction. The right ovary was left in place the ureter was found to be peristalsing. Attention was then placed in the left adnexa similar procedure was undertaken after placing tension on the left fallopian tube and the nasal salpinx was coaptated and transected to the level of the utero tubal junction. The left ureter was also identified to be peristalsing. At this point the Endopouch was introduced through the 10/11 mm trocar site and a 5 mm scope was used through the right lower abdomen for visualization and for placing the specimen inside the Endopouch and retrieving it through the umbilicus incision and passed off the operative field for histological evaluation. Of note pelvic washes abrasive been obtained. Pre-and post procedure pictures were obtained. After ascertaining adequate hemostasis the pneumoperitoneum was removed and port site closure was begun in the following fashion: The subumbilical fascia was placed under traction with Coker clamps and closed with a running stitch of 0 Vicryls suture and the subcutaneous tissue was reapproximated 30 Vicryls suture. The skin edges of the umbilicus and both 5 mm ports oh reapproximated with Dermabond glue. 0.25% Marcaine was infiltrated all 3 incision ports for a total 20 cc. The ring forcep and the vagina was removed as well as the Foley catheter. Patient was extubated and transferred to recovery room stable vital signs.  ESTIMATED BLOOD LOSS: Minimal   Intake/Output Summary (Last 24 hours) at 02/13/15 1203 Last data filed at 02/13/15 1140  Gross per 24 hour  Intake   1000 ml  Output    150 ml  Net  850 ml     BLOOD ADMINISTERED:none   LOCAL MEDICATIONS USED:  MARCAINE   0.25% injected subcutaneous incision ports for total 20  cc  SPECIMEN:  Source of Specimen:  Bilateral fallopian tubes  DISPOSITION OF SPECIMEN:  PATHOLOGY  COUNTS:  YES  PLAN OF CARE: Transfer to PACU  [facility] HMD12:03 PMTD@

## 2015-02-13 NOTE — Interval H&P Note (Signed)
History and Physical Interval Note:  02/13/2015 9:39 AM  Marlise Eves  has presented today for surgery, with the diagnosis of left adnexal mass, right ovarian cyst  The various methods of treatment have been discussed with the patient and family. After consideration of risks, benefits and other options for treatment, the patient has consented to  Procedure(s) with comments: LAPAROSCOPIC SALPINGO OOPHORECTOMY (Right) - case to follow the 7:30am case. ? 10:15am  1 1/2 hours OR time requested for this case.  Needs HARMONIC SCALPEL. LEFT SALPINGECTOMY (Left) LAPAROTOMY (N/A) LEFT SALPINGO OOPHORECTOMY (Left) as a surgical intervention .  The patient's history has been reviewed, patient examined, no change in status, stable for surgery.  I have reviewed the patient's chart and labs.  Questions were answered to the patient's satisfaction.     Ok Edwards

## 2015-02-13 NOTE — H&P (View-Only) (Signed)
[name] is an 49 y.o. female. For her preop examination. Patient scheduled July 12 2 undergo bilateral salpingectomy as a result of right hydrosalpinx. Possible left ovarian cystectomy for small ovarian cyst if present at time of laparoscopy.  Patient's history as follows: seen for her gyn annual exam on 11/13/2014. Patient with perimenopause symptoms. Had [facility] which was in the early perimenopause range today.  Patient with past history of right hydrosalpinx noted on ultrasound in 2014 and past history of ovarian cysts.  Review of her record indicated that patient has a history of ventricular fibrillation, patient has a pacemaker(implantable defibrillator) in place as a result of her long QT interval and has been under the care of her cardiologist Dr. Ladona [name]. She follows up with her cardiologist every 6 months. Patient had a ParaGard T380A IUD placed in 2012. She has suffer from menorrhagia in the past and responded to Lysteda 650 mg 2 tablets by mouth 3 times a day for 5 days of her cycle. Patient's cardiologist has cleared her for surgery.  Patient is here for f/u ultrasound on right suspected hydrosalpinx.  U/S: 09/2012: IUD in cavity Right ovary 2 mm follicle Left ovary 18 mm follicle Right adnexa 4.5 x 1.4 serpentinite like structure  U/S today: Uterus: 6.8 x 5.1 x 4.1 IUD in normal position Presence of tubular serpentine thick walled mass:6.4 x 2.5 x 4.4 avascular (right) Left ovary thin walled echo free cyst 1.9 x 1.8 cm thin wall cyst with internal low level echos 13 x 11 mm avascular No fluid in CDS  Pertinent Gynecological History: Menses: irregular Bleeding: same as above  Contraception: IUD DES exposure: unknown Blood transfusions: none Sexually transmitted diseases: no past history Previous GYN Procedures: 3 nsvd, 1 csection  Last mammogram: normal Date: 2016  Last pap: normal Date: 2016 OB History: G5, P4 1 SAB   Menstrual History: Menarche age:  72 Patient's last menstrual period was 12/29/2014.    Past Medical History  Diagnosis Date  . Ventricular fibrillation   . Symptomatic bradycardia   . Long Q-T syndrome   . Atrial flutter     Past Surgical History  Procedure Laterality Date  . Implantable cardioverter defibrillator implant  2009; 11-18-13    MDT single chamber ICD implanted by Dr [name] 2009; upgrade to dual chamber MDT ICD 11/2013 by Dr [name]  . Tympanostomy tube placement    . Dilation and curettage of uterus  1999  . Cesarean section  1994  . Adenoidectomy    . Intrauterine device insertion  February 13, 2011    PARAGUARD   . Implantable cardioverter defibrillator (icd) generator change N/A 11/18/2013    Procedure: ICD GENERATOR CHANGE;  Surgeon: Tracy Maw, MD;  Location: [facility] CATH LAB;  Service: Cardiovascular;  Laterality: N/A;    Family History  Problem Relation Age of Onset  . Hypertension Father   . Heart disease Father     PACEMAKER INSERTED  . Diabetes Maternal Grandmother   . Cancer Maternal Grandmother     COLON CANCER    Social History:  reports that she has never smoked. She has never used smokeless tobacco. She reports that she drinks alcohol. She reports that she does not use illicit drugs.  Allergies: No Known Allergies   (Not in a hospital admission)  REVIEW OF SYSTEMS: A ROS was performed and pertinent positives and negatives are included in the history.  GENERAL: No fevers or chills. HEENT: No change in vision, no earache, sore  throat or sinus congestion. NECK: No pain or stiffness. CARDIOVASCULAR: No chest pain or pressure. No palpitations. PULMONARY: No shortness of breath, cough or wheeze. GASTROINTESTINAL: No abdominal pain, nausea, vomiting or diarrhea, melena or bright red blood per rectum. GENITOURINARY: No urinary frequency, urgency, hesitancy or dysuria. MUSCULOSKELETAL: No joint or muscle pain, no back pain, no recent trauma. DERMATOLOGIC: No rash, no itching, no lesions.  ENDOCRINE: No polyuria, polydipsia, no heat or cold intolerance. No recent change in weight. HEMATOLOGICAL: No anemia or easy bruising or bleeding. NEUROLOGIC: No headache, seizures, numbness, tingling or weakness. PSYCHIATRIC: No depression, no loss of interest in normal activity or change in sleep pattern.     Blood pressure 118/70, last menstrual period 12/29/2014.  Physical Exam:  HEENT:unremarkable Neck:Supple, midline, no thyroid megaly, no carotid bruits Lungs:  Clear to auscultation no rhonchi's or wheezes Heart:Regular rate and rhythm, no murmurs or gallops Breast Exam: Not examined Abdomen: Soft nontender no rebound or guarding Pelvic:BUS within normal limits Vagina: No lesions or discharge Cervix: No lesions or discharge IUD string seen Uterus: Retroverted upper limits of normal no masses or tenderness Adnexa: Fullness in right adnexa nontender Extremities: No cords, no edema Rectal: Not examined    Assessment/Plan: Scheduled to undergo laparoscopic bilateral salpingectomy as a result of persistent large right hydrosalpinx and a small ovarian cyst that may require ovarian cystectomy at the time of present. Patient was cleared by her cardiologist. Patient was provided with prescription for Percocet and Reglan for her to have which she gets home from surgery. Risk for surgery as discussed below:                        Patient was counseled as to the risk of surgery to include the following:  1. Infection (prohylactic antibiotics will be administered)  2. DVT/Pulmonary Embolism (prophylactic pneumo compression stockings will be used)  3.Trauma to internal organs requiring additional surgical procedure to repair any injury to     Internal organs requiring perhaps additional hospitalization days.  4.Hemmorhage requiring transfusion and blood products which carry risks such as             anaphylactic reaction, hepatitis and AIDS  Patient had received literature information  on the procedure scheduled and all her questions were answered and fully accepts all risk.   [facility] HMD11:49 AMTD@Note :      [name] 01/19/2015, 11:10 AM  Note: This dictation was prepared with  Dragon/digital dictation along withSmart phrase technology. Any transcriptional errors that result from this process are unintentional.

## 2015-02-13 NOTE — Addendum Note (Signed)
Addendum  created 02/13/15 1405 by Earmon Phoenix, CRNA   Modules edited: Anesthesia Flowsheet

## 2015-02-13 NOTE — Transfer of Care (Signed)
Immediate Anesthesia Transfer of Care Note  Patient: [name]  Procedure(s) Performed: Procedure(s): BILATERAL SALPINGECTOMY (Bilateral) OOPHORECTOMY (Right) LAPAROSCOPIC BILATERAL SALPINGECTOMY (Bilateral)  Patient Location: PACU  Anesthesia Type:General  Level of Consciousness: awake, alert , oriented and patient cooperative  Airway & Oxygen Therapy: Patient Spontanous Breathing and Patient connected to nasal cannula oxygen  Post-op Assessment: Report given to RN and Post -op Vital signs reviewed and stable  Post vital signs: Reviewed and stable  Last Vitals:  Filed Vitals:   02/13/15 0923  BP: 130/83  Pulse: 50  Temp: 36.7 C  Resp: 18    Complications: No apparent anesthesia complications

## 2015-02-13 NOTE — Anesthesia Postprocedure Evaluation (Signed)
  Anesthesia Post-op Note  Patient: [name]  Procedure(s) Performed: Procedure(s) (LRB): BILATERAL SALPINGECTOMY (Bilateral) OOPHORECTOMY (Right) LAPAROSCOPIC BILATERAL SALPINGECTOMY (Bilateral)  Patient Location: PACU  Anesthesia Type: General  Level of Consciousness: awake and alert   Airway and Oxygen Therapy: Patient Spontanous Breathing  Post-op Pain: mild  Post-op Assessment: Post-op Vital signs reviewed, Patient's Cardiovascular Status Stable, Respiratory Function Stable, Patent Airway and No signs of Nausea or vomiting  Last Vitals:  Filed Vitals:   02/13/15 1230  BP: 100/64  Pulse: 50  Temp:   Resp: 10    Post-op Vital Signs: stable   Complications: No apparent anesthesia complications

## 2015-02-13 NOTE — Anesthesia Procedure Notes (Signed)
Procedure Name: Intubation Date/Time: 02/13/2015 10:36 AM Performed by: Earmon Phoenix Pre-anesthesia Checklist: Patient identified, Emergency Drugs available, Suction available, Patient being monitored and Timeout performed Patient Re-evaluated:Patient Re-evaluated prior to inductionOxygen Delivery Method: Circle system utilized Preoxygenation: Pre-oxygenation with 100% oxygen Intubation Type: IV induction Ventilation: Mask ventilation without difficulty Laryngoscope Size: Mac and 3 Grade View: Grade II Tube size: 7.0 mm Number of attempts: 1 Placement Confirmation: ETT inserted through vocal cords under direct vision,  breath sounds checked- equal and bilateral,  positive ETCO2 and CO2 detector Secured at: 20 cm Tube secured with: Tape Dental Injury: Teeth and Oropharynx as per pre-operative assessment

## 2015-02-14 ENCOUNTER — Encounter (HOSPITAL_COMMUNITY): Payer: Self-pay | Admitting: Gynecology

## 2015-02-14 MED FILL — Heparin Sodium (Porcine) Inj 5000 Unit/ML: INTRAMUSCULAR | Qty: 1 | Status: AC

## 2015-02-19 ENCOUNTER — Ambulatory Visit (INDEPENDENT_AMBULATORY_CARE_PROVIDER_SITE_OTHER): Payer: BC Managed Care – PPO | Admitting: *Deleted

## 2015-02-19 ENCOUNTER — Telehealth: Payer: Self-pay | Admitting: Cardiology

## 2015-02-19 DIAGNOSIS — I469 Cardiac arrest, cause unspecified: Secondary | ICD-10-CM | POA: Diagnosis not present

## 2015-02-19 NOTE — Progress Notes (Signed)
Remote ICD transmission.   

## 2015-02-19 NOTE — Telephone Encounter (Signed)
Spoke with pt and reminded pt of remote transmission that is due today. Pt verbalized understanding.   

## 2015-02-21 LAB — CUP PACEART REMOTE DEVICE CHECK
Battery Remaining Longevity: 117 mo
Battery Voltage: 3.01 V
Brady Statistic AP VP Percent: 0.02 %
Brady Statistic AP VS Percent: 25.34 %
Brady Statistic AS VP Percent: 0.05 %
Brady Statistic AS VS Percent: 74.59 %
Brady Statistic RA Percent Paced: 25.36 %
Brady Statistic RV Percent Paced: 0.07 %
Date Time Interrogation Session: 20160718175608
HIGH POWER IMPEDANCE MEASURED VALUE: 63 Ohm
HighPow Impedance: 49 Ohm
Lead Channel Impedance Value: 418 Ohm
Lead Channel Pacing Threshold Amplitude: 0.5 V
Lead Channel Pacing Threshold Amplitude: 0.875 V
Lead Channel Sensing Intrinsic Amplitude: 3.25 mV
Lead Channel Sensing Intrinsic Amplitude: 7 mV
Lead Channel Setting Sensing Sensitivity: 0.3 mV
MDC IDC MSMT LEADCHNL RA IMPEDANCE VALUE: 475 Ohm
MDC IDC MSMT LEADCHNL RA PACING THRESHOLD PULSEWIDTH: 0.4 ms
MDC IDC MSMT LEADCHNL RV IMPEDANCE VALUE: 361 Ohm
MDC IDC MSMT LEADCHNL RV PACING THRESHOLD PULSEWIDTH: 0.4 ms
MDC IDC SET LEADCHNL RA PACING AMPLITUDE: 2 V
MDC IDC SET LEADCHNL RV PACING AMPLITUDE: 2.5 V
MDC IDC SET LEADCHNL RV PACING PULSEWIDTH: 0.4 ms
MDC IDC SET ZONE DETECTION INTERVAL: 350 ms
MDC IDC SET ZONE DETECTION INTERVAL: 450 ms
Zone Setting Detection Interval: 250 ms
Zone Setting Detection Interval: 300 ms

## 2015-03-01 ENCOUNTER — Encounter: Payer: Self-pay | Admitting: Gynecology

## 2015-03-01 ENCOUNTER — Ambulatory Visit (INDEPENDENT_AMBULATORY_CARE_PROVIDER_SITE_OTHER): Payer: BC Managed Care – PPO | Admitting: Gynecology

## 2015-03-01 VITALS — BP 124/70

## 2015-03-01 DIAGNOSIS — Z30433 Encounter for removal and reinsertion of intrauterine contraceptive device: Secondary | ICD-10-CM | POA: Diagnosis not present

## 2015-03-01 DIAGNOSIS — Z975 Presence of (intrauterine) contraceptive device: Secondary | ICD-10-CM | POA: Insufficient documentation

## 2015-03-01 NOTE — Progress Notes (Signed)
   Patient presented to the office today for her two-week postop visit as well as for removal of her ParaGard T380A IUD and replace it with a Mirena IUD to help with her menorrhagia as well as pelvic pain.  On 02/13/2015 patient underwent bilateral salpingectomy as well as drainage of a right simple ovarian cyst. At time of surgery it was noted that she had endometriosis with obliteration of the cul-de-sac. Pictures were shown to the patient today as well as the following pathology report discussed:  Diagnosis Fallopian tube, bilateral - BENIGN BILATERAL FALLOPIAN TUBES SHOWING INVOLVEMENT WITH ENDOMETRIOSIS AND FINDINGS CONSISTENT WITH HYDROSALPINX. - BENIGN BILATERAL PARATUBAL CYST FORMATION. - NO ATYPIA OR MALIGNANCY IDENTIFIED. - SEE COMMENT. Microscopic Comment Of note, focal metaplastic changes are identified adjacent to the fallopian tube. Regardless, there is no atypia or malignancy identified.  Patient has done well postoperatively. Her vital signs were stable today.   Exam: Abdomen: Incision ports completely healed abdomen soft nontender no rebound or guarding Pelvic: Bartholin urethra Skene was within normal limits Vagina: No lesions or discharge menstrual blood present Cervix IUD was visualized menstrual blood present Uterus retroverted normal size shape and consistency and nontender Adnexa: No palpable masses or tenderness Rectal and vaginal exam not done  For procedure note:                                                                   IUD procedure note       Patient presented to the office today for placement of Mirena IUD. The patient had previously been provided with literature information on this method of contraception. The risks benefits and pros and cons were discussed and all her questions were answered. She is fully aware that this form of contraception is 99% effective and is good for 5 years.  Pelvic exam: As described above. The cervix was cleansed with  Betadine solution. A single-tooth tenaculum was placed on the anterior cervical lip. The IUD string was grasped retrieved shown to the patient discarded. The uterus sounded to 7 centimeter. The IUD was shown to the patient and inserted in a sterile fashion. The IUD string was trimmed. The single-tooth tenaculum was removed. Patient was instructed to return back to the office in one month for follow up.  IUD lot number: [id number]

## 2015-03-01 NOTE — Patient Instructions (Signed)

## 2015-03-09 ENCOUNTER — Encounter: Payer: Self-pay | Admitting: Cardiology

## 2015-03-13 ENCOUNTER — Encounter: Payer: Self-pay | Admitting: Internal Medicine

## 2015-03-23 ENCOUNTER — Other Ambulatory Visit: Payer: Self-pay | Admitting: Internal Medicine

## 2015-04-04 ENCOUNTER — Ambulatory Visit (INDEPENDENT_AMBULATORY_CARE_PROVIDER_SITE_OTHER): Payer: BC Managed Care – PPO | Admitting: Gynecology

## 2015-04-04 ENCOUNTER — Encounter: Payer: Self-pay | Admitting: Gynecology

## 2015-04-04 VITALS — BP 118/76

## 2015-04-04 DIAGNOSIS — Z30431 Encounter for routine checking of intrauterine contraceptive device: Secondary | ICD-10-CM

## 2015-04-04 NOTE — Progress Notes (Signed)
   Patient presented to the office for her 1 month follow-up after having removed the ParaGard T380A IUD and replacing it with a new Mirena IUD. Patient with no complaints today. Review of her record indicated that back in July 12 of this year she had undergone gone laparoscopic bilateral salpingectomy as well as drainage of a right simple ovarian cyst. At time of surgery it was noted that she had endometriosis with obliteration of the cul-de-sac. Her pathology report demonstrated the following:  Diagnosis Fallopian tube, bilateral - BENIGN BILATERAL FALLOPIAN TUBES SHOWING INVOLVEMENT WITH ENDOMETRIOSIS AND FINDINGS CONSISTENT WITH HYDROSALPINX. - BENIGN BILATERAL PARATUBAL CYST FORMATION. - NO ATYPIA OR MALIGNANCY IDENTIFIED. - SEE COMMENT. Microscopic Comment Of note, focal metaplastic changes are identified adjacent to the fallopian tube. Regardless, there is no atypia or malignancy identified.   patient has done well from her surgery.  Exam: Abdomen: Soft nontender no rebound or guarding Pelvic: Bartholin urethra Skene was within normal limits Vagina: No lesions or discharge Cervix: IUD string visualized Uterus: Anteverted normal size shape and consistency Adnexa: No palpable masses or tenderness Rectal exam: Not done   Assessment/plan: Patient one month status post replacement of IUD from a nonhormonal ParaGard T380A IUD term Mirena IUD and is doing well. Patient scheduled to return back next year for annual exam or when necessary.

## 2015-05-21 ENCOUNTER — Telehealth: Payer: Self-pay | Admitting: Cardiology

## 2015-05-21 ENCOUNTER — Encounter: Payer: BC Managed Care – PPO | Admitting: *Deleted

## 2015-05-21 NOTE — Telephone Encounter (Signed)
LMOVM reminding pt to send remote transmission.   

## 2015-05-22 ENCOUNTER — Encounter: Payer: Self-pay | Admitting: Cardiology

## 2015-05-24 ENCOUNTER — Ambulatory Visit (INDEPENDENT_AMBULATORY_CARE_PROVIDER_SITE_OTHER): Payer: BC Managed Care – PPO | Admitting: *Deleted

## 2015-05-24 DIAGNOSIS — I469 Cardiac arrest, cause unspecified: Secondary | ICD-10-CM | POA: Diagnosis not present

## 2015-05-28 NOTE — Progress Notes (Signed)
Remote ICD transmission.   

## 2015-05-30 ENCOUNTER — Encounter: Payer: Self-pay | Admitting: Cardiology

## 2015-05-30 LAB — CUP PACEART REMOTE DEVICE CHECK
Battery Voltage: 3.01 V
Brady Statistic AP VP Percent: 0.02 %
Brady Statistic AP VS Percent: 27.53 %
Brady Statistic AS VP Percent: 0.04 %
Brady Statistic AS VS Percent: 72.41 %
HighPow Impedance: 46 Ohm
HighPow Impedance: 64 Ohm
Implantable Lead Implant Date: 20090605
Implantable Lead Implant Date: 20150417
Implantable Lead Location: 753859
Lead Channel Impedance Value: 475 Ohm
Lead Channel Impedance Value: 513 Ohm
Lead Channel Pacing Threshold Amplitude: 0.75 V
Lead Channel Pacing Threshold Pulse Width: 0.4 ms
Lead Channel Pacing Threshold Pulse Width: 0.4 ms
Lead Channel Sensing Intrinsic Amplitude: 0.625 mV
Lead Channel Setting Pacing Amplitude: 2 V
Lead Channel Setting Pacing Amplitude: 2.5 V
Lead Channel Setting Pacing Pulse Width: 0.4 ms
MDC IDC LEAD LOCATION: 753860
MDC IDC LEAD MODEL: 7120
MDC IDC MSMT BATTERY REMAINING LONGEVITY: 114 mo
MDC IDC MSMT LEADCHNL RA PACING THRESHOLD AMPLITUDE: 0.5 V
MDC IDC MSMT LEADCHNL RV IMPEDANCE VALUE: 361 Ohm
MDC IDC MSMT LEADCHNL RV SENSING INTR AMPL: 6.125 mV
MDC IDC SESS DTM: 20161020230202
MDC IDC SET LEADCHNL RV SENSING SENSITIVITY: 0.3 mV
MDC IDC STAT BRADY RA PERCENT PACED: 27.55 %
MDC IDC STAT BRADY RV PERCENT PACED: 0.06 %

## 2015-08-23 ENCOUNTER — Telehealth: Payer: Self-pay | Admitting: Cardiology

## 2015-08-23 ENCOUNTER — Encounter: Payer: BC Managed Care – PPO | Admitting: *Deleted

## 2015-08-23 NOTE — Telephone Encounter (Signed)
LMOVM reminding pt to send remote transmission.   

## 2015-08-24 ENCOUNTER — Encounter: Payer: Self-pay | Admitting: Cardiology

## 2015-09-04 ENCOUNTER — Other Ambulatory Visit: Payer: Self-pay | Admitting: Internal Medicine

## 2015-09-10 ENCOUNTER — Telehealth: Payer: Self-pay | Admitting: Internal Medicine

## 2015-09-10 NOTE — Telephone Encounter (Signed)
Patient aware per Dr Ladona Ridgel okay to get

## 2015-09-10 NOTE — Telephone Encounter (Signed)
New message   pateint calling wants to know can she have botox injection with her history of long QT

## 2016-01-24 ENCOUNTER — Other Ambulatory Visit: Payer: Self-pay | Admitting: Internal Medicine

## 2016-02-21 ENCOUNTER — Other Ambulatory Visit: Payer: Self-pay | Admitting: Internal Medicine

## 2016-03-13 ENCOUNTER — Telehealth: Payer: Self-pay | Admitting: Internal Medicine

## 2016-03-13 MED ORDER — METOPROLOL SUCCINATE ER 50 MG PO TB24: ORAL_TABLET | ORAL | 2 refills | Status: DC

## 2016-03-13 NOTE — Telephone Encounter (Signed)
New Message  Refill- Metroprolol, [facility], [address] [phone], 90 days (preferred)

## 2016-05-09 ENCOUNTER — Ambulatory Visit (INDEPENDENT_AMBULATORY_CARE_PROVIDER_SITE_OTHER): Payer: BC Managed Care – PPO | Admitting: Internal Medicine

## 2016-05-09 ENCOUNTER — Encounter: Payer: Self-pay | Admitting: Internal Medicine

## 2016-05-09 VITALS — BP 130/80 | HR 61 | Ht 64.0 in | Wt 126.6 lb

## 2016-05-09 DIAGNOSIS — Z95810 Presence of automatic (implantable) cardiac defibrillator: Secondary | ICD-10-CM

## 2016-05-09 MED ORDER — MEXILETINE HCL 150 MG PO CAPS: 150.0000 mg | ORAL_CAPSULE | Freq: Two times a day (BID) | ORAL | 3 refills | Status: DC

## 2016-05-09 MED ORDER — POTASSIUM CHLORIDE CRYS ER 20 MEQ PO TBCR: 40.0000 meq | EXTENDED_RELEASE_TABLET | Freq: Two times a day (BID) | ORAL | 3 refills | Status: DC

## 2016-05-09 MED ORDER — METOPROLOL SUCCINATE ER 50 MG PO TB24: ORAL_TABLET | ORAL | 3 refills | Status: DC

## 2016-05-09 NOTE — Patient Instructions (Addendum)
Medication Instructions:  Your physician recommends that you continue on your current medications as directed. Please refer to the Current Medication list given to you today.    Labwork: None ordered   Testing/Procedures: None ordered   Follow-Up: Your physician wants you to follow-up in: 12 months with Dr. Ladona Ridgel. You will receive a reminder letter in the mail two months in advance. If you don't receive a letter, please call our office to schedule the follow-up appointment.  Remote monitoring is used to monitor your ICD from home. This monitoring reduces the number of office visits required to check your device to one time per year. It allows [place] to keep an eye on the functioning of your device to ensure it is working properly. You are scheduled for a device check from home on 08/11/16. You may send your transmission at any time that day. If you have a wireless device, the transmission will be sent automatically. After your physician reviews your transmission, you will receive a postcard with your next transmission date.     Any Other Special Instructions Will Be Listed Below (If Applicable).     If you need a refill on your cardiac medications before your next appointment, please call your pharmacy.

## 2016-05-09 NOTE — Progress Notes (Signed)
HPI [name] returns today for followup. 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 In the interim she has had a single episode of atrial flutter for which she was paced back to NSR.  She exercises regularly. She has had no recent ICD shocks. She has lost over 30 lbs using weight watchers and exercise. No Known Allergies   Current Outpatient Prescriptions  Medication Sig Dispense Refill  . acetaminophen (TYLENOL) 500 MG tablet Take 1,000 mg by mouth every 6 (six) hours as needed for mild pain or headache.    . ibuprofen (ADVIL,MOTRIN) 200 MG tablet Take 400 mg by mouth every 6 (six) hours as needed. For pain    . metoprolol succinate (TOPROL-XL) 50 MG 24 hr tablet TAKE 1/2 TABLET BY MOUTH EVERY MORNING AND 1 TABLET EVERY NIGHT AT BEDTIME Take with or immediately following a meal.    . mexiletine (MEXITIL) 150 MG capsule TAKE ONE CAPSULE BY MOUTH TWICE DAILY 60 capsule 0  . potassium chloride SA (K-DUR,KLOR-CON) 20 MEQ tablet TAKE 2 TABLETS BY MOUTH TWICE DAILY 120 tablet 0   No current facility-administered medications for this visit.      Past Medical History:  Diagnosis Date  . AICD (automatic cardioverter/defibrillator) present   . Atrial flutter (HCC)   . IUD (intrauterine device) in place 2016  . Long Q-T syndrome   . Myocardial infarction    due to long QT  . Presence of permanent cardiac pacemaker   . Symptomatic bradycardia   . Ventricular fibrillation (HCC)     ROS:   All systems reviewed and negative except as noted in the HPI.   Past Surgical History:  Procedure Laterality Date  . ADENOIDECTOMY    . BILATERAL SALPINGECTOMY Bilateral 02/13/2015   Procedure: BILATERAL SALPINGECTOMY;  Surgeon: Ok EdwardsJuan H Fernandez, MD;  Location: [facility];  Service: Gynecology;  Laterality: Bilateral;  . CESAREAN SECTION  1994  . DILATION AND CURETTAGE OF UTERUS  1999    . IMPLANTABLE CARDIOVERTER DEFIBRILLATOR (ICD) GENERATOR CHANGE N/A 11/18/2013   Procedure: ICD GENERATOR CHANGE;  Surgeon: Marinus MawGregg W Chance Munter, MD;  [facility];  Service: Cardiovascular;  Laterality: N/A;  . IMPLANTABLE CARDIOVERTER DEFIBRILLATOR IMPLANT  2009; 11-18-13   MDT single chamber ICD implanted by Dr Ladona Ridgelaylor 2009; upgrade to dual chamber MDT ICD 11/2013 by Dr Ladona Ridgelaylor  . INTRAUTERINE DEVICE INSERTION  February 13, 2011   PARAGUARD   . LAPAROSCOPIC BILATERAL SALPINGECTOMY Bilateral 02/13/2015   Procedure: LAPAROSCOPIC BILATERAL SALPINGECTOMY;  Surgeon: Ok EdwardsJuan H Fernandez, MD;  Location: [facility];  Service: Gynecology;  Laterality: Bilateral;  . OOPHORECTOMY Right 02/13/2015   Procedure: OOPHORECTOMY;  Surgeon: Ok EdwardsJuan H Fernandez, MD;  Location: [facility];  Service: Gynecology;  Laterality: Right;  . TYMPANOSTOMY TUBE PLACEMENT    . wisdom teeeth        Family History  Problem Relation Age of Onset  . Hypertension Father   . Heart disease Father     PACEMAKER INSERTED  . Diabetes Maternal Grandmother   . Cancer Maternal Grandmother     COLON CANCER     Social History   Social History  . Marital status: Married    Spouse name: N/A  . Number of children: N/A  . Years of education: N/A   Occupational History  . Not on file.   Social History Main Topics  .  Smoking status: Never Smoker  . Smokeless tobacco: Never Used  . Alcohol use Yes     Comment: occasionally  . Drug use: No  . Sexual activity: Yes    Partners: Male    Birth control/ protection: IUD     Comment: PARAGARD   Other Topics Concern  . Not on file   Social History Narrative  . No narrative on file     BP 130/80   Pulse 61   Ht 5\' 4"  (1.626 m)   Wt 126 lb 9.6 oz (57.4 kg)   BMI 21.73 kg/m   Physical Exam:  Well appearing 50 yo woman, NAD HEENT: Unremarkable Neck:  6 cm JVD, no thyromegally Back:  No CVA tenderness Lungs:  Clear with no wheezes, well healed ICD implant. HEART:  Regular rate  rhythm, no murmurs, no rubs, no clicks Abd:  soft, positive bowel sounds, no organomegally, no rebound, no guarding Ext:  2 plus pulses, no edema, no cyanosis, no clubbing Skin:  No rashes no nodules Neuro:  CN II through XII intact, motor grossly intact   DEVICE  Normal device function.  See PaceArt for details.   Assess/Plan: 1. VF - she has had no recurrent episodes. She will continue her current meds. 2. PAF - she has had none since her last visit. 3. ICD - her Medtronic device is working normally.  Lewayne Bunting, M.D.

## 2016-07-01 ENCOUNTER — Telehealth: Payer: Self-pay | Admitting: *Deleted

## 2016-07-01 NOTE — Telephone Encounter (Signed)
Pt called c/o back pain x 1 weeks, asked for name of orthopedic doctor, I gave pt name and # of [facility], she has [employer] and can schedule her own appointment.

## 2016-08-06 ENCOUNTER — Encounter: Payer: BC Managed Care – PPO | Admitting: Gynecology

## 2016-08-11 ENCOUNTER — Ambulatory Visit (INDEPENDENT_AMBULATORY_CARE_PROVIDER_SITE_OTHER): Payer: BC Managed Care – PPO | Admitting: *Deleted

## 2016-08-11 ENCOUNTER — Telehealth: Payer: Self-pay | Admitting: Cardiology

## 2016-08-11 DIAGNOSIS — I469 Cardiac arrest, cause unspecified: Secondary | ICD-10-CM

## 2016-08-11 NOTE — Telephone Encounter (Signed)
Spoke with pt and reminded pt of remote transmission that is due today. Pt verbalized understanding.   

## 2016-08-12 NOTE — Progress Notes (Signed)
Remote ICD transmission.   

## 2016-08-13 ENCOUNTER — Encounter: Payer: Self-pay | Admitting: Cardiology

## 2016-08-13 LAB — CUP PACEART REMOTE DEVICE CHECK
Battery Remaining Longevity: 98 mo
Battery Voltage: 3 V
Brady Statistic AP VP Percent: 0.01 %
Brady Statistic AP VS Percent: 23.86 %
Brady Statistic AS VP Percent: 0.05 %
Brady Statistic AS VS Percent: 76.08 %
Brady Statistic RV Percent Paced: 0.06 %
Date Time Interrogation Session: 20180108215150
HighPow Impedance: 45 Ohm
HighPow Impedance: 63 Ohm
Implantable Lead Implant Date: 20150417
Implantable Lead Location: 753859
Implantable Lead Model: 7120
Implantable Pulse Generator Implant Date: 20150417
Lead Channel Impedance Value: 475 Ohm
Lead Channel Pacing Threshold Amplitude: 0.75 V
Lead Channel Pacing Threshold Pulse Width: 0.4 ms
Lead Channel Pacing Threshold Pulse Width: 0.4 ms
Lead Channel Sensing Intrinsic Amplitude: 2.75 mV
Lead Channel Sensing Intrinsic Amplitude: 5.875 mV
Lead Channel Sensing Intrinsic Amplitude: 5.875 mV
Lead Channel Setting Pacing Amplitude: 2 V
Lead Channel Setting Pacing Amplitude: 2.5 V
Lead Channel Setting Pacing Pulse Width: 0.4 ms
MDC IDC LEAD IMPLANT DT: 20090605
MDC IDC LEAD LOCATION: 753860
MDC IDC MSMT LEADCHNL RA PACING THRESHOLD AMPLITUDE: 0.625 V
MDC IDC MSMT LEADCHNL RA SENSING INTR AMPL: 2.75 mV
MDC IDC MSMT LEADCHNL RV IMPEDANCE VALUE: 361 Ohm
MDC IDC MSMT LEADCHNL RV IMPEDANCE VALUE: 418 Ohm
MDC IDC SET LEADCHNL RV SENSING SENSITIVITY: 0.3 mV
MDC IDC STAT BRADY RA PERCENT PACED: 23.81 %

## 2016-11-10 ENCOUNTER — Telehealth: Payer: Self-pay | Admitting: Cardiology

## 2016-11-10 ENCOUNTER — Encounter: Payer: BC Managed Care – PPO | Admitting: *Deleted

## 2016-11-10 NOTE — Telephone Encounter (Signed)
LMOVM reminding pt to send remote transmission.   

## 2016-11-14 ENCOUNTER — Encounter: Payer: Self-pay | Admitting: Cardiology

## 2016-12-17 ENCOUNTER — Encounter: Payer: Self-pay | Admitting: Gynecology

## 2017-05-26 ENCOUNTER — Other Ambulatory Visit: Payer: Self-pay | Admitting: Internal Medicine

## 2017-05-26 NOTE — Telephone Encounter (Signed)
New message    Pt is calling.    *STAT* If patient is at the pharmacy, call can be transferred to refill team.   1. Which medications need to be refilled? (please list name of each medication and dose if known) all medications from Dr. Gunnar Bulla cv  2. Which pharmacy/location (including street and city if local pharmacy) is medication to be sent to? [facility] on [address]   3. Do they need a 30 day or 90 day supply? 30 day

## 2017-06-18 ENCOUNTER — Other Ambulatory Visit: Payer: Self-pay | Admitting: *Deleted

## 2017-06-18 MED ORDER — MEXILETINE HCL 150 MG PO CAPS: 150.0000 mg | ORAL_CAPSULE | Freq: Two times a day (BID) | ORAL | 0 refills | Status: DC

## 2017-06-18 MED ORDER — METOPROLOL SUCCINATE ER 50 MG PO TB24: ORAL_TABLET | ORAL | 0 refills | Status: DC

## 2017-06-23 ENCOUNTER — Other Ambulatory Visit: Payer: Self-pay | Admitting: Internal Medicine

## 2017-07-29 DIAGNOSIS — I4581 Long QT syndrome: Secondary | ICD-10-CM | POA: Insufficient documentation

## 2017-07-29 DIAGNOSIS — R001 Bradycardia, unspecified: Secondary | ICD-10-CM | POA: Insufficient documentation

## 2017-07-29 DIAGNOSIS — Z950 Presence of cardiac pacemaker: Secondary | ICD-10-CM | POA: Insufficient documentation

## 2017-07-29 DIAGNOSIS — I219 Acute myocardial infarction, unspecified: Secondary | ICD-10-CM | POA: Insufficient documentation

## 2017-07-29 DIAGNOSIS — Z95810 Presence of automatic (implantable) cardiac defibrillator: Secondary | ICD-10-CM | POA: Insufficient documentation

## 2017-07-29 DIAGNOSIS — I4901 Ventricular fibrillation: Secondary | ICD-10-CM | POA: Insufficient documentation

## 2017-08-14 ENCOUNTER — Ambulatory Visit: Payer: BC Managed Care – PPO | Admitting: Internal Medicine

## 2017-08-14 ENCOUNTER — Encounter: Payer: Self-pay | Admitting: Internal Medicine

## 2017-08-14 VITALS — BP 104/70 | HR 51 | Ht 64.0 in | Wt 155.4 lb

## 2017-08-14 DIAGNOSIS — I480 Paroxysmal atrial fibrillation: Secondary | ICD-10-CM

## 2017-08-14 DIAGNOSIS — I469 Cardiac arrest, cause unspecified: Secondary | ICD-10-CM | POA: Diagnosis not present

## 2017-08-14 DIAGNOSIS — Z95810 Presence of automatic (implantable) cardiac defibrillator: Secondary | ICD-10-CM | POA: Diagnosis not present

## 2017-08-14 NOTE — Patient Instructions (Signed)
Medication Instructions:  Your physician recommends that you continue on your current medications as directed. Please refer to the Current Medication list given to you today.  Labwork: None ordered.  Testing/Procedures: None ordered.  Follow-Up: Your physician wants you to follow-up in: one year with Dr. Ladona Ridgel.   You will receive a reminder letter in the mail two months in advance. If you don't receive a letter, please call our office to schedule the follow-up appointment.  Remote monitoring is used to monitor your ICD from home. This monitoring reduces the number of office visits required to check your device to one time per year. It allows [place] to keep an eye on the functioning of your device to ensure it is working properly. You are scheduled for a device check from home on 11/10/2017. You may send your transmission at any time that day. If you have a wireless device, the transmission will be sent automatically. After your physician reviews your transmission, you will receive a postcard with your next transmission date.   Any Other Special Instructions Will Be Listed Below (If Applicable).  If you need a refill on your cardiac medications before your next appointment, please call your pharmacy.

## 2017-08-14 NOTE — Progress Notes (Signed)
HPI [name] returns today for ongoing evaluation and management of VF, s/p ICD insertion. She likely has the Brugada syndrome. She has sinus node dysfunction and PAF. She had an episode of shingles when I saw her last. In the interim she has done well with no chest pain.  No Known Allergies   Current Outpatient Medications  Medication Sig Dispense Refill  . acetaminophen (TYLENOL) 500 MG tablet Take 1,000 mg by mouth every 6 (six) hours as needed for mild pain or headache.    . ibuprofen (ADVIL,MOTRIN) 200 MG tablet Take 400 mg by mouth every 6 (six) hours as needed. For pain    . metoprolol succinate (TOPROL-XL) 50 MG 24 hr tablet TAKE 1/2 TABLET BY MOUTH EVERY MORNING AND 1 TABLET BY MOUTH EVERY NIGHT AT BEDTIME Take with or immediately following a meal. 135 tablet 0  . mexiletine (MEXITIL) 150 MG capsule Take 1 capsule (150 mg total) 2 (two) times daily by mouth. 180 capsule 0  . potassium chloride SA (K-DUR,KLOR-CON) 20 MEQ tablet Take two (2) tablets (40 meq) by mouth twice daily. Please keep 1/11 at 4 pm appointment for additional refills. 120 tablet 2   No current facility-administered medications for this visit.      Past Medical History:  Diagnosis Date  . AICD (automatic cardioverter/defibrillator) present   . Atrial flutter (HCC)   . IUD (intrauterine device) in place 2016  . Long Q-T syndrome   . Myocardial infarction (HCC)    due to long QT  . Presence of permanent cardiac pacemaker   . Symptomatic bradycardia   . Ventricular fibrillation (HCC)     ROS:   All systems reviewed and negative except as noted in the HPI.   Past Surgical History:  Procedure Laterality Date  . ADENOIDECTOMY    . BILATERAL SALPINGECTOMY Bilateral 02/13/2015   Procedure: BILATERAL SALPINGECTOMY;  Surgeon: Ok Edwards, MD;  Location: [facility];  Service: Gynecology;  Laterality: Bilateral;  . CESAREAN SECTION  1994  . DILATION AND CURETTAGE OF UTERUS  1999  . IMPLANTABLE  CARDIOVERTER DEFIBRILLATOR (ICD) GENERATOR CHANGE N/A 11/18/2013   Procedure: ICD GENERATOR CHANGE;  Surgeon: Marinus Maw, MD;  Location: [facility] CATH LAB;  Service: Cardiovascular;  Laterality: N/A;  . IMPLANTABLE CARDIOVERTER DEFIBRILLATOR IMPLANT  2009; 11-18-13   MDT single chamber ICD implanted by Dr Ladona Ridgel 2009; upgrade to dual chamber MDT ICD 11/2013 by Dr Ladona Ridgel  . INTRAUTERINE DEVICE INSERTION  February 13, 2011   PARAGUARD   . LAPAROSCOPIC BILATERAL SALPINGECTOMY Bilateral 02/13/2015   Procedure: LAPAROSCOPIC BILATERAL SALPINGECTOMY;  Surgeon: Ok Edwards, MD;  Location: [facility];  Service: Gynecology;  Laterality: Bilateral;  . OOPHORECTOMY Right 02/13/2015   Procedure: OOPHORECTOMY;  Surgeon: Ok Edwards, MD;  Location: [facility];  Service: Gynecology;  Laterality: Right;  . TYMPANOSTOMY TUBE PLACEMENT    . wisdom teeeth        Family History  Problem Relation Age of Onset  . Hypertension Father   . Heart disease Father        PACEMAKER INSERTED  . Diabetes Maternal Grandmother   . Cancer Maternal Grandmother        COLON CANCER     Social History   Socioeconomic History  . Marital status: Married    Spouse name: Not on file  . Number of children: Not on file  . Years of education: Not on file  . Highest education level: Not on file  Social Needs  . Financial resource strain: Not on file  . Food insecurity - worry: Not on file  . Food insecurity - inability: Not on file  . Transportation needs - medical: Not on file  . Transportation needs - non-medical: Not on file  Occupational History  . Not on file  Tobacco Use  . Smoking status: Never Smoker  . Smokeless tobacco: Never Used  Substance and Sexual Activity  . Alcohol use: Yes    Comment: occasionally  . Drug use: No  . Sexual activity: Yes    Partners: Male    Birth control/protection: IUD    Comment: PARAGARD  Other Topics Concern  . Not on file  Social History Narrative  . Not on file     BP  104/70   Pulse (!) 51   Ht 5\' 4"  (1.626 m)   Wt 155 lb 6.4 oz (70.5 kg)   SpO2 98%   BMI 26.67 kg/m   Physical Exam:  Well appearing middle aged woman, NAD HEENT: Unremarkable Neck:  6 cm JVD, no thyromegally Lymphatics:  No adenopathy Back:  No CVA tenderness Lungs:  Clear with no wheezes HEART:  Regular rate rhythm, no murmurs, no rubs, no clicks Abd:  soft, positive bowel sounds, no organomegally, no rebound, no guarding Ext:  2 plus pulses, no edema, no cyanosis, no clubbing Skin:  No rashes no nodules Neuro:  CN II through XII intact, motor grossly intact  EKG - Sinus brady with atrial pacing  DEVICE  Normal device function.  See PaceArt for details.   Assess/Plan: 1. Brugada syndrome - she has had no ventricular arrhythmias. She will continue mexitil and low dose beta blocker. 2. PAF - she has maintained NSR very nicely.  3. ICD - interogation of her device demonstrates no ICD therapies. 4. Sinus node dysfunction - she is atrial pacing about a third of the time and is asymptomatic.   [name].D.

## 2017-09-03 ENCOUNTER — Telehealth: Payer: Self-pay | Admitting: Internal Medicine

## 2017-09-03 NOTE — Telephone Encounter (Signed)
New message     [facility] Pre-operative Risk Assessment    Request for surgical clearance:  1. What type of surgery is being performed? No surgery, Dental ultra sonic cleaner  2. When is this surgery scheduled? 3/14 @ 2pm  3. What type of clearance is required (medical clearance vs. Pharmacy clearance to hold med vs. Both)? medical  4. Are there any medications that need to be held prior to surgery and how long? no  5. Practice name and name of physician performing surgery? [facility]. Dr. Annitta Jersey  What is your office phone and fax number? [place]: [phone]  Fax: [phone] 6. Anesthesia type (None, local, MAC, general) ? N/A   [name] 09/03/2017, 3:00 PM  _________________________________________________________________   (provider comments below)

## 2017-09-04 NOTE — Telephone Encounter (Addendum)
   Primary Cardiologist: Lewayne Bunting, MD  Chart reviewed as part of pre-operative protocol coverage.   Please provide recommendations regarding clearance. You have seen the patient on 08/14/17.   [place], [place] 09/04/2017, 2:24 PM

## 2017-09-06 NOTE — Telephone Encounter (Signed)
Low risk for surgery. She should start her meds back asap. GT

## 2017-09-07 NOTE — Telephone Encounter (Signed)
   Chart reviewed as part of pre-operative protocol coverage. Pre-op clearance already addressed in prior notes. Per Dr. Ladona Ridgel, patient is "Low risk for surgery. She should start her meds back asap."   Will route this bundled recommendation to requesting provider via Epic fax function. Please call with questions.  Laurann Montana, PA-C 09/07/2017, 9:14 AM

## 2017-09-16 ENCOUNTER — Other Ambulatory Visit: Payer: Self-pay | Admitting: Internal Medicine

## 2017-10-25 ENCOUNTER — Telehealth: Payer: Self-pay | Admitting: Cardiology

## 2017-10-25 NOTE — Telephone Encounter (Signed)
Received page with request to return patient phone call with message that patient noted her heart was racing. Attempted to call patient back at phone number listed but without answer and unidentified voicemail so unable to leave message.

## 2017-10-26 ENCOUNTER — Telehealth: Payer: Self-pay | Admitting: Internal Medicine

## 2017-10-26 NOTE — Telephone Encounter (Signed)
°  1. Has your device fired? no  2. Is you device beeping? no  3. Are you experiencing draining or swelling at device site? no  4. Are you calling to see if we received your device transmission? yes  5. Have you passed out? No  Patient sent in transmission due to increase in heart rate    Please route to Device Clinic Pool

## 2017-10-26 NOTE — Telephone Encounter (Signed)
Spoke with patient who states that she has been sick and noticed that her HR was higher than her usual baseline. She states she runs at 60-62 and her current HR is in the 80's. She reports that she is taking a decongestant, eye drops and ear drops. I explained that with multiple new medications and illness I would expect her HR to return to normal. She verbalized understanding.

## 2017-10-30 ENCOUNTER — Telehealth: Payer: Self-pay | Admitting: Internal Medicine

## 2017-10-30 NOTE — Telephone Encounter (Signed)
New message    Pt c/o BP issue: STAT if pt c/o blurred vision, one-sided weakness or slurred speech  1. What are your last 5 BP readings? 135/80  2. Are you having any other symptoms (ex. Dizziness, headache, blurred vision, passed out)? Headache  3. What is your BP issue? Patient feels BP too high  No chest pain, no shortness of breath.

## 2017-11-02 ENCOUNTER — Telehealth: Payer: Self-pay | Admitting: Internal Medicine

## 2017-11-02 NOTE — Telephone Encounter (Signed)
Left message notifying Pt blood pressure is not too high.  If she is concerned she should keep a log over a few weeks, one blood pressure reading is not enough to make med changes.  Notified headache probably unrelated to blood pressure reading.  Left this nurse name and # for call back if any questions.

## 2017-11-02 NOTE — Telephone Encounter (Signed)
Follow Up: ° ° ° °Returning your call from this morning. °

## 2017-11-02 NOTE — Telephone Encounter (Signed)
Returned call to Pt.   Per Pt she feels her BP is high for her.  States systolic is normally 114.  Has recently been sick and is taking amoxicillin.  Pt has one blood pressure reading of 150/80, thinks another time it was 135/76.   Advised Pt to take BP daily, at a time when she is relaxed and not after eating.  Asked her to do this for 7 days and record the readings.  After 7 days advised her to call this nurse with the readings so that Dr. Ladona Ridgel may review.  Pt indicates understanding.

## 2017-11-10 ENCOUNTER — Ambulatory Visit (INDEPENDENT_AMBULATORY_CARE_PROVIDER_SITE_OTHER): Payer: BC Managed Care – PPO | Admitting: *Deleted

## 2017-11-10 DIAGNOSIS — I469 Cardiac arrest, cause unspecified: Secondary | ICD-10-CM

## 2017-11-10 NOTE — Progress Notes (Signed)
Remote ICD transmission.   

## 2017-11-12 ENCOUNTER — Encounter: Payer: Self-pay | Admitting: Cardiology

## 2017-12-07 LAB — CUP PACEART REMOTE DEVICE CHECK
Battery Remaining Longevity: 78 mo
Battery Voltage: 2.99 V
Brady Statistic AP VP Percent: 0.03 %
Brady Statistic AP VS Percent: 53.85 %
Brady Statistic AS VS Percent: 46.1 %
Brady Statistic RA Percent Paced: 53.83 %
Brady Statistic RV Percent Paced: 0.05 %
Date Time Interrogation Session: 20190409082405
HIGH POWER IMPEDANCE MEASURED VALUE: 44 Ohm
HighPow Impedance: 55 Ohm
Implantable Lead Implant Date: 20090605
Implantable Lead Implant Date: 20150417
Implantable Lead Location: 753860
Implantable Lead Model: 5076
Implantable Pulse Generator Implant Date: 20150417
Lead Channel Impedance Value: 342 Ohm
Lead Channel Impedance Value: 399 Ohm
Lead Channel Impedance Value: 475 Ohm
Lead Channel Pacing Threshold Amplitude: 0.5 V
Lead Channel Pacing Threshold Pulse Width: 0.4 ms
Lead Channel Pacing Threshold Pulse Width: 0.4 ms
Lead Channel Sensing Intrinsic Amplitude: 2.375 mV
Lead Channel Sensing Intrinsic Amplitude: 5.875 mV
Lead Channel Setting Pacing Amplitude: 2 V
Lead Channel Setting Sensing Sensitivity: 0.3 mV
MDC IDC LEAD LOCATION: 753859
MDC IDC MSMT LEADCHNL RA SENSING INTR AMPL: 2.375 mV
MDC IDC MSMT LEADCHNL RV PACING THRESHOLD AMPLITUDE: 0.875 V
MDC IDC MSMT LEADCHNL RV SENSING INTR AMPL: 5.875 mV
MDC IDC SET LEADCHNL RV PACING AMPLITUDE: 2.5 V
MDC IDC SET LEADCHNL RV PACING PULSEWIDTH: 0.4 ms
MDC IDC STAT BRADY AS VP PERCENT: 0.02 %

## 2018-01-24 ENCOUNTER — Other Ambulatory Visit: Payer: Self-pay | Admitting: Internal Medicine

## 2018-02-08 ENCOUNTER — Telehealth: Payer: Self-pay | Admitting: Internal Medicine

## 2018-02-08 NOTE — Telephone Encounter (Signed)
New message    Patient calling regarding rash on her face, she wants to confirm she can take Benadryl with Long QT syndrome

## 2018-02-08 NOTE — Telephone Encounter (Signed)
Returned call to Pt.  Advised per pharmacy-do not take Benadryl.   Pt may take Zyrtec or Allegra as a substitute.   Pt indicates understanding.

## 2018-02-09 ENCOUNTER — Ambulatory Visit (INDEPENDENT_AMBULATORY_CARE_PROVIDER_SITE_OTHER): Payer: BC Managed Care – PPO | Admitting: *Deleted

## 2018-02-09 DIAGNOSIS — I469 Cardiac arrest, cause unspecified: Secondary | ICD-10-CM | POA: Diagnosis not present

## 2018-02-09 NOTE — Progress Notes (Signed)
Remote ICD transmission.   

## 2018-02-24 LAB — CUP PACEART REMOTE DEVICE CHECK
Brady Statistic AP VP Percent: 0.02 %
Brady Statistic AP VS Percent: 40.12 %
Brady Statistic AS VP Percent: 0.04 %
Brady Statistic RA Percent Paced: 40.11 %
Date Time Interrogation Session: 20190709041704
HIGH POWER IMPEDANCE MEASURED VALUE: 68 Ohm
HighPow Impedance: 50 Ohm
Implantable Lead Location: 753859
Implantable Lead Model: 5076
Implantable Lead Model: 7120
Lead Channel Impedance Value: 361 Ohm
Lead Channel Impedance Value: 513 Ohm
Lead Channel Pacing Threshold Amplitude: 0.75 V
Lead Channel Pacing Threshold Pulse Width: 0.4 ms
Lead Channel Sensing Intrinsic Amplitude: 2.625 mV
Lead Channel Sensing Intrinsic Amplitude: 6.875 mV
Lead Channel Setting Pacing Amplitude: 2.5 V
Lead Channel Setting Pacing Pulse Width: 0.4 ms
Lead Channel Setting Sensing Sensitivity: 0.3 mV
MDC IDC LEAD IMPLANT DT: 20090605
MDC IDC LEAD IMPLANT DT: 20150417
MDC IDC LEAD LOCATION: 753860
MDC IDC MSMT BATTERY REMAINING LONGEVITY: 72 mo
MDC IDC MSMT BATTERY VOLTAGE: 2.99 V
MDC IDC MSMT LEADCHNL RA PACING THRESHOLD AMPLITUDE: 0.625 V
MDC IDC MSMT LEADCHNL RA PACING THRESHOLD PULSEWIDTH: 0.4 ms
MDC IDC MSMT LEADCHNL RA SENSING INTR AMPL: 2.625 mV
MDC IDC MSMT LEADCHNL RV IMPEDANCE VALUE: 456 Ohm
MDC IDC MSMT LEADCHNL RV SENSING INTR AMPL: 6.875 mV
MDC IDC PG IMPLANT DT: 20150417
MDC IDC SET LEADCHNL RA PACING AMPLITUDE: 2 V
MDC IDC STAT BRADY AS VS PERCENT: 59.82 %
MDC IDC STAT BRADY RV PERCENT PACED: 0.06 %

## 2018-04-27 ENCOUNTER — Encounter: Payer: Self-pay | Admitting: Women's Health

## 2018-04-27 ENCOUNTER — Ambulatory Visit: Payer: BC Managed Care – PPO | Admitting: Women's Health

## 2018-04-27 VITALS — BP 126/78 | Ht 64.0 in | Wt 141.0 lb

## 2018-04-27 DIAGNOSIS — Z01419 Encounter for gynecological examination (general) (routine) without abnormal findings: Secondary | ICD-10-CM | POA: Diagnosis not present

## 2018-04-27 NOTE — Patient Instructions (Signed)
[facility]  Dr Carlean Purl  [facility] [phone]  Health Maintenance for Postmenopausal Women Menopause is a normal process in which your reproductive ability comes to an end. This process happens gradually over a span of months to years, usually between the ages of 23 and 68. Menopause is complete when you have missed 12 consecutive menstrual periods. It is important to talk with your health care provider about some of the most common conditions that affect postmenopausal women, such as heart disease, cancer, and bone loss (osteoporosis). Adopting a healthy lifestyle and getting preventive care can help to promote your health and wellness. Those actions can also lower your chances of developing some of these common conditions. What should I know about menopause? During menopause, you may experience a number of symptoms, such as:  Moderate-to-severe hot flashes.  Night sweats.  Decrease in sex drive.  Mood swings.  Headaches.  Tiredness.  Irritability.  Memory problems.  Insomnia.  Choosing to treat or not to treat menopausal changes is an individual decision that you make with your health care provider. What should I know about hormone replacement therapy and supplements? Hormone therapy products are effective for treating symptoms that are associated with menopause, such as hot flashes and night sweats. Hormone replacement carries certain risks, especially as you become older. If you are thinking about using estrogen or estrogen with progestin treatments, discuss the benefits and risks with your health care provider. What should I know about heart disease and stroke? Heart disease, heart attack, and stroke become more likely as you age. This may be due, in part, to the hormonal changes that your body experiences during menopause. These can affect how your body processes dietary fats, triglycerides, and cholesterol. Heart attack and stroke are both medical  emergencies. There are many things that you can do to help prevent heart disease and stroke:  Have your blood pressure checked at least every 1-2 years. High blood pressure causes heart disease and increases the risk of stroke.  If you are 48-23 years old, ask your health care provider if you should take aspirin to prevent a heart attack or a stroke.  Do not use any tobacco products, including cigarettes, chewing tobacco, or electronic cigarettes. If you need help quitting, ask your health care provider.  It is important to eat a healthy diet and maintain a healthy weight. ? Be sure to include plenty of vegetables, fruits, low-fat dairy products, and lean protein. ? Avoid eating foods that are high in solid fats, added sugars, or salt (sodium).  Get regular exercise. This is one of the most important things that you can do for your health. ? Try to exercise for at least 150 minutes each week. The type of exercise that you do should increase your heart rate and make you sweat. This is known as moderate-intensity exercise. ? Try to do strengthening exercises at least twice each week. Do these in addition to the moderate-intensity exercise.  Know your numbers.Ask your health care provider to check your cholesterol and your blood glucose. Continue to have your blood tested as directed by your health care provider.  What should I know about cancer screening? There are several types of cancer. Take the following steps to reduce your risk and to catch any cancer development as early as possible. Breast Cancer  Practice breast self-awareness. ? This means understanding how your breasts normally appear and feel. ? It also means doing regular breast self-exams. Let your health care  provider know about any changes, no matter how small.  If you are 12 or older, have a clinician do a breast exam (clinical breast exam or CBE) every year. Depending on your age, family history, and medical history, it  may be recommended that you also have a yearly breast X-ray (mammogram).  If you have a family history of breast cancer, talk with your health care provider about genetic screening.  If you are at high risk for breast cancer, talk with your health care provider about having an MRI and a mammogram every year.  Breast cancer (BRCA) gene test is recommended for women who have family members with BRCA-related cancers. Results of the assessment will determine the need for genetic counseling and BRCA1 and for BRCA2 testing. BRCA-related cancers include these types: ? Breast. This occurs in males or females. ? Ovarian. ? Tubal. This may also be called fallopian tube cancer. ? Cancer of the abdominal or pelvic lining (peritoneal cancer). ? Prostate. ? Pancreatic.  Cervical, Uterine, and Ovarian Cancer Your health care provider may recommend that you be screened regularly for cancer of the pelvic organs. These include your ovaries, uterus, and vagina. This screening involves a pelvic exam, which includes checking for microscopic changes to the surface of your cervix (Pap test).  For women ages 21-65, health care providers may recommend a pelvic exam and a Pap test every three years. For women ages 8-65, they may recommend the Pap test and pelvic exam, combined with testing for human papilloma virus (HPV), every five years. Some types of HPV increase your risk of cervical cancer. Testing for HPV may also be done on women of any age who have unclear Pap test results.  Other health care providers may not recommend any screening for nonpregnant women who are considered low risk for pelvic cancer and have no symptoms. Ask your health care provider if a screening pelvic exam is right for you.  If you have had past treatment for cervical cancer or a condition that could lead to cancer, you need Pap tests and screening for cancer for at least 20 years after your treatment. If Pap tests have been discontinued  for you, your risk factors (such as having a new sexual partner) need to be reassessed to determine if you should start having screenings again. Some women have medical problems that increase the chance of getting cervical cancer. In these cases, your health care provider may recommend that you have screening and Pap tests more often.  If you have a family history of uterine cancer or ovarian cancer, talk with your health care provider about genetic screening.  If you have vaginal bleeding after reaching menopause, tell your health care provider.  There are currently no reliable tests available to screen for ovarian cancer.  Lung Cancer Lung cancer screening is recommended for adults 76-42 years old who are at high risk for lung cancer because of a history of smoking. A yearly low-dose CT scan of the lungs is recommended if you:  Currently smoke.  Have a history of at least 30 pack-years of smoking and you currently smoke or have quit within the past 15 years. A pack-year is smoking an average of one pack of cigarettes per day for one year.  Yearly screening should:  Continue until it has been 15 years since you quit.  Stop if you develop a health problem that would prevent you from having lung cancer treatment.  Colorectal Cancer  This type of cancer can be  detected and can often be prevented.  Routine colorectal cancer screening usually begins at age 31 and continues through age 32.  If you have risk factors for colon cancer, your health care provider may recommend that you be screened at an earlier age.  If you have a family history of colorectal cancer, talk with your health care provider about genetic screening.  Your health care provider may also recommend using home test kits to check for hidden blood in your stool.  A small camera at the end of a tube can be used to examine your colon directly (sigmoidoscopy or colonoscopy). This is done to check for the earliest forms of  colorectal cancer.  Direct examination of the colon should be repeated every 5-10 years until age 13. However, if early forms of precancerous polyps or small growths are found or if you have a family history or genetic risk for colorectal cancer, you may need to be screened more often.  Skin Cancer  Check your skin from head to toe regularly.  Monitor any moles. Be sure to tell your health care provider: ? About any new moles or changes in moles, especially if there is a change in a mole's shape or color. ? If you have a mole that is larger than the size of a pencil eraser.  If any of your family members has a history of skin cancer, especially at a Miciah Shealy age, talk with your health care provider about genetic screening.  Always use sunscreen. Apply sunscreen liberally and repeatedly throughout the day.  Whenever you are outside, protect yourself by wearing long sleeves, pants, a wide-brimmed hat, and sunglasses.  What should I know about osteoporosis? Osteoporosis is a condition in which bone destruction happens more quickly than new bone creation. After menopause, you may be at an increased risk for osteoporosis. To help prevent osteoporosis or the bone fractures that can happen because of osteoporosis, the following is recommended:  If you are 48-24 years old, get at least 1,000 mg of calcium and at least 600 mg of vitamin D per day.  If you are older than age 43 but younger than age 71, get at least 1,200 mg of calcium and at least 600 mg of vitamin D per day.  If you are older than age 51, get at least 1,200 mg of calcium and at least 800 mg of vitamin D per day.  Smoking and excessive alcohol intake increase the risk of osteoporosis. Eat foods that are rich in calcium and vitamin D, and do weight-bearing exercises several times each week as directed by your health care provider. What should I know about how menopause affects my mental health? Depression may occur at any age, but it  is more common as you become older. Common symptoms of depression include:  Low or sad mood.  Changes in sleep patterns.  Changes in appetite or eating patterns.  Feeling an overall lack of motivation or enjoyment of activities that you previously enjoyed.  Frequent crying spells.  Talk with your health care provider if you think that you are experiencing depression. What should I know about immunizations? It is important that you get and maintain your immunizations. These include:  Tetanus, diphtheria, and pertussis (Tdap) booster vaccine.  Influenza every year before the flu season begins.  Pneumonia vaccine.  Shingles vaccine.  Your health care provider may also recommend other immunizations. This information is not intended to replace advice given to you by your health care provider. Make sure  you discuss any questions you have with your health care provider. Document Released: 09/12/2005 Document Revised: 02/08/2016 Document Reviewed: 04/24/2015 Elsevier Interactive Patient Education  2018 [employer].

## 2018-04-27 NOTE — Progress Notes (Signed)
[name] 06/12/1966 [id number]    History:    Presents for annual exam.  02/2015 Mirena IUD for DU B with no bleeding, 2012 bilateral salpingectomy   Normal Pap and mammogram history overdue for mammogram.  Has not had a screening colonoscopy.  History  has a pacemaker with a defibrillator for long QT interval, ventricular fibrillation cardiologist manages.  Past medical history, past surgical history, family history and social history were all reviewed and documented in the EPIC chart.  First grade teacher.  4 children all doing well youngest senior in high school.  ROS:  A ROS was performed and pertinent positives and negatives are included.  Exam:  Vitals:   04/27/18 1532  BP: 126/78  Weight: 141 lb (64 kg)  Height: 5\' 4"  (1.626 m)   Body mass index is 24.2 kg/m.   General appearance:  Normal Thyroid:  Symmetrical, normal in size, without palpable masses or nodularity. Respiratory  Auscultation:  Clear without wheezing or rhonchi Cardiovascular  Auscultation:  Regular rate, without rubs, murmurs or gallops  Edema/varicosities:  Not grossly evident Abdominal  Soft,nontender, without masses, guarding or rebound.  Liver/spleen:  No organomegaly noted  Hernia:  None appreciated  Skin  Inspection:  Grossly normal   Breasts: Examined lying and sitting.     Right: Without masses, retractions, discharge or axillary adenopathy.     Left: Without masses, retractions, discharge or axillary adenopathy. Gentitourinary   Inguinal/mons:  Normal without inguinal adenopathy  External genitalia:  Normal  BUS/Urethra/Skene's glands:  Normal  Vagina:  Normal  Cervix:  Normal IUD strings visible  Uterus:  normal in size, shape and contour.  Midline and mobile  Adnexa/parametria:     Rt: Without masses or tenderness.   Lt: Without masses or tenderness.  Anus and perineum: Normal  Digital rectal exam: Normal sphincter tone without palpated masses or tenderness  Assessment/Plan:   52 y.o. MWF G5, P4 for annual exam with no GYN complaints.  02/2015 Mirena IUD no bleeding Pacemaker with defibrillator for history of ventricular fibrillation with long QT interval cardiologist manages  Plan: Aware Mirena IUD is good for 5 years, will keep in, minimal menopausal symptoms.  SBE's, overdue for mammogram strongly encouraged to schedule, breast center information given.  Exercise, calcium rich foods,  vitamin D 2000 daily encouraged.  Screening colonoscopy reviewed, [facility] information given instructed to schedule.  Pap with HR HPV typing, new screening guidelines reviewed.  Pap normal 2016.   Harrington Challenger [facility], 4:03 PM 04/27/2018

## 2018-04-28 LAB — PAP, TP IMAGING W/ HPV RNA, RFLX HPV TYPE 16,18/45: HPV DNA HIGH RISK: NOT DETECTED

## 2018-05-11 ENCOUNTER — Ambulatory Visit (INDEPENDENT_AMBULATORY_CARE_PROVIDER_SITE_OTHER): Payer: BC Managed Care – PPO | Admitting: *Deleted

## 2018-05-11 DIAGNOSIS — I469 Cardiac arrest, cause unspecified: Secondary | ICD-10-CM

## 2018-05-11 DIAGNOSIS — I480 Paroxysmal atrial fibrillation: Secondary | ICD-10-CM

## 2018-05-12 NOTE — Progress Notes (Signed)
Remote ICD transmission.   

## 2018-05-19 ENCOUNTER — Encounter: Payer: Self-pay | Admitting: Cardiology

## 2018-06-03 LAB — CUP PACEART REMOTE DEVICE CHECK
Battery Remaining Longevity: 72 mo
Battery Voltage: 2.99 V
Brady Statistic AP VP Percent: 0.04 %
Brady Statistic AP VS Percent: 47.84 %
Brady Statistic RA Percent Paced: 47.87 %
Brady Statistic RV Percent Paced: 0.06 %
Date Time Interrogation Session: 20191008062824
HIGH POWER IMPEDANCE MEASURED VALUE: 46 Ohm
HIGH POWER IMPEDANCE MEASURED VALUE: 64 Ohm
Implantable Lead Implant Date: 20090605
Implantable Lead Location: 753859
Implantable Lead Model: 5076
Implantable Pulse Generator Implant Date: 20150417
Lead Channel Impedance Value: 342 Ohm
Lead Channel Impedance Value: 418 Ohm
Lead Channel Pacing Threshold Amplitude: 0.625 V
Lead Channel Pacing Threshold Pulse Width: 0.4 ms
Lead Channel Sensing Intrinsic Amplitude: 6.125 mV
Lead Channel Setting Pacing Amplitude: 2 V
Lead Channel Setting Pacing Pulse Width: 0.4 ms
Lead Channel Setting Sensing Sensitivity: 0.3 mV
MDC IDC LEAD IMPLANT DT: 20150417
MDC IDC LEAD LOCATION: 753860
MDC IDC MSMT LEADCHNL RA IMPEDANCE VALUE: 513 Ohm
MDC IDC MSMT LEADCHNL RA SENSING INTR AMPL: 2.25 mV
MDC IDC MSMT LEADCHNL RA SENSING INTR AMPL: 2.25 mV
MDC IDC MSMT LEADCHNL RV PACING THRESHOLD AMPLITUDE: 0.875 V
MDC IDC MSMT LEADCHNL RV PACING THRESHOLD PULSEWIDTH: 0.4 ms
MDC IDC MSMT LEADCHNL RV SENSING INTR AMPL: 6.125 mV
MDC IDC SET LEADCHNL RV PACING AMPLITUDE: 2.5 V
MDC IDC STAT BRADY AS VP PERCENT: 0.02 %
MDC IDC STAT BRADY AS VS PERCENT: 52.11 %

## 2018-08-10 ENCOUNTER — Ambulatory Visit (INDEPENDENT_AMBULATORY_CARE_PROVIDER_SITE_OTHER): Payer: BC Managed Care – PPO

## 2018-08-10 DIAGNOSIS — I469 Cardiac arrest, cause unspecified: Secondary | ICD-10-CM

## 2018-08-12 LAB — CUP PACEART REMOTE DEVICE CHECK
Battery Remaining Longevity: 66 mo
Battery Voltage: 2.98 V
Brady Statistic AP VP Percent: 0.03 %
Brady Statistic AS VP Percent: 0.02 %
Brady Statistic RA Percent Paced: 47.78 %
Brady Statistic RV Percent Paced: 0.06 %
Date Time Interrogation Session: 20200109033825
HIGH POWER IMPEDANCE MEASURED VALUE: 46 Ohm
HighPow Impedance: 67 Ohm
Implantable Lead Implant Date: 20150417
Implantable Lead Location: 753859
Implantable Lead Location: 753860
Implantable Lead Model: 5076
Implantable Pulse Generator Implant Date: 20150417
Lead Channel Impedance Value: 361 Ohm
Lead Channel Impedance Value: 456 Ohm
Lead Channel Pacing Threshold Amplitude: 0.875 V
Lead Channel Pacing Threshold Pulse Width: 0.4 ms
Lead Channel Sensing Intrinsic Amplitude: 2.25 mV
Lead Channel Sensing Intrinsic Amplitude: 6.125 mV
Lead Channel Sensing Intrinsic Amplitude: 6.125 mV
Lead Channel Setting Pacing Amplitude: 2 V
Lead Channel Setting Pacing Amplitude: 2.5 V
Lead Channel Setting Pacing Pulse Width: 0.4 ms
Lead Channel Setting Sensing Sensitivity: 0.3 mV
MDC IDC LEAD IMPLANT DT: 20090605
MDC IDC MSMT LEADCHNL RA IMPEDANCE VALUE: 532 Ohm
MDC IDC MSMT LEADCHNL RA PACING THRESHOLD AMPLITUDE: 0.625 V
MDC IDC MSMT LEADCHNL RA SENSING INTR AMPL: 2.25 mV
MDC IDC MSMT LEADCHNL RV PACING THRESHOLD PULSEWIDTH: 0.4 ms
MDC IDC STAT BRADY AP VS PERCENT: 47.76 %
MDC IDC STAT BRADY AS VS PERCENT: 52.19 %

## 2018-08-12 NOTE — Progress Notes (Signed)
Remote ICD transmission.   

## 2018-10-29 ENCOUNTER — Other Ambulatory Visit: Payer: Self-pay | Admitting: Internal Medicine

## 2018-11-09 ENCOUNTER — Other Ambulatory Visit: Payer: Self-pay

## 2018-11-09 ENCOUNTER — Ambulatory Visit (INDEPENDENT_AMBULATORY_CARE_PROVIDER_SITE_OTHER): Payer: BC Managed Care – PPO | Admitting: *Deleted

## 2018-11-09 DIAGNOSIS — I469 Cardiac arrest, cause unspecified: Secondary | ICD-10-CM

## 2018-11-09 LAB — CUP PACEART REMOTE DEVICE CHECK
Battery Remaining Longevity: 62 mo
Battery Voltage: 2.98 V
Brady Statistic AP VP Percent: 0.02 %
Brady Statistic AP VS Percent: 44.04 %
Brady Statistic AS VP Percent: 0.03 %
Brady Statistic AS VS Percent: 55.91 %
Brady Statistic RA Percent Paced: 44.05 %
Brady Statistic RV Percent Paced: 0.06 %
Date Time Interrogation Session: 20200407052503
HighPow Impedance: 51 Ohm
HighPow Impedance: 73 Ohm
Implantable Lead Implant Date: 20090605
Implantable Lead Implant Date: 20150417
Implantable Lead Location: 753859
Implantable Lead Location: 753860
Implantable Lead Model: 5076
Implantable Lead Model: 7120
Implantable Pulse Generator Implant Date: 20150417
Lead Channel Impedance Value: 361 Ohm
Lead Channel Impedance Value: 456 Ohm
Lead Channel Impedance Value: 532 Ohm
Lead Channel Pacing Threshold Amplitude: 0.625 V
Lead Channel Pacing Threshold Amplitude: 0.75 V
Lead Channel Pacing Threshold Pulse Width: 0.4 ms
Lead Channel Pacing Threshold Pulse Width: 0.4 ms
Lead Channel Sensing Intrinsic Amplitude: 2.25 mV
Lead Channel Sensing Intrinsic Amplitude: 2.25 mV
Lead Channel Sensing Intrinsic Amplitude: 6.75 mV
Lead Channel Sensing Intrinsic Amplitude: 6.75 mV
Lead Channel Setting Pacing Amplitude: 2 V
Lead Channel Setting Pacing Amplitude: 2.5 V
Lead Channel Setting Pacing Pulse Width: 0.4 ms
Lead Channel Setting Sensing Sensitivity: 0.3 mV

## 2018-11-17 ENCOUNTER — Encounter: Payer: Self-pay | Admitting: Cardiology

## 2018-11-17 NOTE — Progress Notes (Signed)
Remote ICD transmission.   

## 2018-12-01 ENCOUNTER — Other Ambulatory Visit: Payer: Self-pay | Admitting: Internal Medicine

## 2019-01-24 ENCOUNTER — Other Ambulatory Visit: Payer: Self-pay | Admitting: Women's Health

## 2019-01-24 DIAGNOSIS — Z1231 Encounter for screening mammogram for malignant neoplasm of breast: Secondary | ICD-10-CM

## 2019-01-27 ENCOUNTER — Telehealth: Payer: Self-pay | Admitting: Internal Medicine

## 2019-01-27 NOTE — Telephone Encounter (Signed)
Will discuss at upcoming appt.

## 2019-01-27 NOTE — Telephone Encounter (Signed)
Patient will be moving just outside of [place] to [place] and she would like Dr Lovena Le to recommend a cardiologist for her to see there. She has a appointment with Dr Lovena Le on 02/08/19.

## 2019-02-07 ENCOUNTER — Ambulatory Visit (INDEPENDENT_AMBULATORY_CARE_PROVIDER_SITE_OTHER): Payer: BC Managed Care – PPO | Admitting: Internal Medicine

## 2019-02-07 ENCOUNTER — Encounter: Payer: Self-pay | Admitting: Internal Medicine

## 2019-02-07 ENCOUNTER — Ambulatory Visit
Admission: RE | Admit: 2019-02-07 | Discharge: 2019-02-07 | Disposition: A | Discharge status: Home / Self Care | Payer: BC Managed Care – PPO | Source: Ambulatory Visit | Attending: Women's Health | Admitting: Women's Health

## 2019-02-07 ENCOUNTER — Other Ambulatory Visit: Payer: Self-pay

## 2019-02-07 ENCOUNTER — Telehealth: Payer: Self-pay | Admitting: Internal Medicine

## 2019-02-07 VITALS — BP 98/66 | HR 50 | Ht 60.0 in | Wt 140.0 lb

## 2019-02-07 DIAGNOSIS — I4901 Ventricular fibrillation: Secondary | ICD-10-CM | POA: Diagnosis not present

## 2019-02-07 DIAGNOSIS — I480 Paroxysmal atrial fibrillation: Secondary | ICD-10-CM

## 2019-02-07 DIAGNOSIS — Z95810 Presence of automatic (implantable) cardiac defibrillator: Secondary | ICD-10-CM

## 2019-02-07 DIAGNOSIS — Z1231 Encounter for screening mammogram for malignant neoplasm of breast: Secondary | ICD-10-CM

## 2019-02-07 LAB — CUP PACEART INCLINIC DEVICE CHECK
Battery Remaining Longevity: 58 mo
Battery Voltage: 2.97 V
Brady Statistic AP VP Percent: 0.03 %
Brady Statistic AP VS Percent: 43.2 %
Brady Statistic AS VP Percent: 0.03 %
Brady Statistic AS VS Percent: 56.74 %
Brady Statistic RA Percent Paced: 43.21 %
Brady Statistic RV Percent Paced: 0.06 %
Date Time Interrogation Session: 20200706165738
HighPow Impedance: 49 Ohm
HighPow Impedance: 73 Ohm
Implantable Lead Implant Date: 20090605
Implantable Lead Implant Date: 20150417
Implantable Lead Location: 753859
Implantable Lead Location: 753860
Implantable Lead Model: 5076
Implantable Lead Model: 7120
Implantable Pulse Generator Implant Date: 20150417
Lead Channel Impedance Value: 361 Ohm
Lead Channel Impedance Value: 456 Ohm
Lead Channel Impedance Value: 513 Ohm
Lead Channel Pacing Threshold Amplitude: 0.625 V
Lead Channel Pacing Threshold Amplitude: 0.75 V
Lead Channel Pacing Threshold Pulse Width: 0.4 ms
Lead Channel Pacing Threshold Pulse Width: 0.4 ms
Lead Channel Sensing Intrinsic Amplitude: 2.5 mV
Lead Channel Sensing Intrinsic Amplitude: 2.875 mV
Lead Channel Sensing Intrinsic Amplitude: 7 mV
Lead Channel Sensing Intrinsic Amplitude: 7.5 mV
Lead Channel Setting Pacing Amplitude: 2 V
Lead Channel Setting Pacing Amplitude: 2.5 V
Lead Channel Setting Pacing Pulse Width: 0.4 ms
Lead Channel Setting Sensing Sensitivity: 0.3 mV

## 2019-02-07 NOTE — Patient Instructions (Signed)
Medication Instructions:  Your physician recommends that you continue on your current medications as directed. Please refer to the Current Medication list given to you today.  Labwork: None ordered.  Testing/Procedures: None ordered.  Follow-Up: Your physician wants you to follow-up in: one year with Dr. Lovena Le.   You will receive a reminder letter in the mail two months in advance. If you don't receive a letter, please call our office to schedule the follow-up appointment.  Remote monitoring is used to monitor your ICD from home. This monitoring reduces the number of office visits required to check your device to one time per year. It allows [place] to keep an eye on the functioning of your device to ensure it is working properly. You are scheduled for a device check from home on 02/08/2019. You may send your transmission at any time that day. If you have a wireless device, the transmission will be sent automatically. After your physician reviews your transmission, you will receive a postcard with your next transmission date.  Any Other Special Instructions Will Be Listed Below (If Applicable).  If you need a refill on your cardiac medications before your next appointment, please call your pharmacy.

## 2019-02-07 NOTE — Progress Notes (Signed)
HPI 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 She also has sinus node dysfunction and is s/p DDD ICD insertion. In the interim, she has done well with no syncope or ICD therapies. She is preparing to move to [place]. She denies palpitations. No Known Allergies   Current Outpatient Medications  Medication Sig Dispense Refill  . acetaminophen (TYLENOL) 500 MG tablet Take 1,000 mg by mouth every 6 (six) hours as needed for mild pain or headache.    . ibuprofen (ADVIL,MOTRIN) 200 MG tablet Take 400 mg by mouth every 6 (six) hours as needed. For pain    . metoprolol succinate (TOPROL-XL) 50 MG 24 hr tablet TAKE HALF TABLET BY MOUTH EVERY MORNING AND ONE TABLET EVERY NIGHT AT BEDTIME WITH OR IMMEDIATELY FOLLOWING A MEAL 135 tablet 0  . mexiletine (MEXITIL) 150 MG capsule Take 1 capsule (150 mg total) by mouth 2 (two) times daily. Please schedule an appt for further refills,1st attempt 180 capsule 0  . potassium chloride SA (K-DUR,KLOR-CON) 20 MEQ tablet TAKE 2 TABLETS BY MOUTH TWICE DAILY 120 tablet 6   No current facility-administered medications for this visit.      Past Medical History:  Diagnosis Date  . AICD (automatic cardioverter/defibrillator) present   . Atrial flutter (HCC)   . IUD (intrauterine device) in place 2016  . Long Q-T syndrome   . Myocardial infarction (HCC)    due to long QT  . Presence of permanent cardiac pacemaker   . Symptomatic bradycardia   . Ventricular fibrillation (HCC)     ROS:   All systems reviewed and negative except as noted in the HPI.   Past Surgical History:  Procedure Laterality Date  . ADENOIDECTOMY    . BILATERAL SALPINGECTOMY Bilateral 02/13/2015   Procedure: BILATERAL SALPINGECTOMY;  Surgeon: Ok Edwards, MD;  Location: [facility];  Service: Gynecology;  Laterality: Bilateral;  . CESAREAN SECTION  1994  . DILATION AND CURETTAGE OF UTERUS  1999  . IMPLANTABLE CARDIOVERTER  DEFIBRILLATOR (ICD) GENERATOR CHANGE N/A 11/18/2013   Procedure: ICD GENERATOR CHANGE;  Surgeon: Marinus Maw, MD;  Location[facility] CATH LAB;  Service: Cardiovascular;  Laterality: N/A;  . IMPLANTABLE CARDIOVERTER DEFIBRILLATOR IMPLANT  2009; 11-18-13   MDT single chamber ICD implanted by Dr Ladona Ridgel 2009; upgrade to dual chamber MDT ICD 11/2013 by Dr Ladona Ridgel  . INTRAUTERINE DEVICE INSERTION  February 13, 2011   PARAGUARD   . LAPAROSCOPIC BILATERAL SALPINGECTOMY Bilateral 02/13/2015   Procedure: LAPAROSCOPIC BILATERAL SALPINGECTOMY;  Surgeon: Ok Edwards, MD;  Location: [facility];  Service: Gynecology;  Laterality: Bilateral;  . OOPHORECTOMY Right 02/13/2015   Procedure: OOPHORECTOMY;  Surgeon: Ok Edwards, MD;  Location: [facility];  Service: Gynecology;  Laterality: Right;  . TYMPANOSTOMY TUBE PLACEMENT    . wisdom teeeth        Family History  Problem Relation Age of Onset  . Hypertension Father   . Heart disease Father        PACEMAKER INSERTED  . Diabetes Maternal Grandmother   . Cancer Maternal Grandmother        COLON CANCER     Social History   Socioeconomic History  . Marital status: Married    Spouse name: Not on file  . Number of children: Not on file  . Years of education: Not on file  . Highest education level: Not on file  Occupational History  . Not on file  Social Needs  . Financial resource strain: Not on file  . Food insecurity    Worry: Not on file    Inability: Not on file  . Transportation needs    Medical: Not on file    Non-medical: Not on file  Tobacco Use  . Smoking status: Never Smoker  . Smokeless tobacco: Never Used  Substance and Sexual Activity  . Alcohol use: Yes    Comment: occasionally  . Drug use: No  . Sexual activity: Yes    Partners: Male    Birth control/protection: I.U.D.    Comment: PARAGARD  Lifestyle  . Physical activity    Days per week: Not on file    Minutes per session: Not on file  . Stress: Not on file  Relationships   . Social Herbalist on phone: Not on file    Gets together: Not on file    Attends religious service: Not on file    Active member of club or organization: Not on file    Attends meetings of clubs or organizations: Not on file    Relationship status: Not on file  . Intimate partner violence    Fear of current or ex partner: Not on file    Emotionally abused: Not on file    Physically abused: Not on file    Forced sexual activity: Not on file  Other Topics Concern  . Not on file  Social History Narrative  . Not on file     BP 98/66   Pulse (!) 50   Ht 5' (1.524 m)   Wt 140 lb (63.5 kg)   HC 4" (10.2 cm)   SpO2 98%   BMI 27.34 kg/m   Physical Exam:  Well appearing middle aged woman, NAD HEENT: Unremarkable Neck:  6 cm JVD, no thyromegally Lymphatics:  No adenopathy Back:  No CVA tenderness Lungs:  Clear with no wheezes HEART:  Regular rate rhythm, no murmurs, no rubs, no clicks Abd:  soft, positive bowel sounds, no organomegally, no rebound, no guarding Ext:  2 plus pulses, no edema, no cyanosis, no clubbing Skin:  No rashes no nodules Neuro:  CN II through XII intact, motor grossly intact  EKG - nsr with atrial pacing and RBBB and LAFB  DEVICE  Normal device function.  See PaceArt for details.   Assess/Plan: 1. 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 I encouraged her to avoid fever if at all possible.  2. PAF - she has not had any atrial fib since her last check. She will undergo watchful waiting.  3. ICD - her medtronic DDD ICD is working normally.  4. Sinus node dysfunction - she is pacing about 43% of the time in the atrium  [employer].D.

## 2019-02-07 NOTE — Telephone Encounter (Signed)

## 2019-02-08 ENCOUNTER — Encounter: Payer: BC Managed Care – PPO | Admitting: Internal Medicine

## 2019-02-08 ENCOUNTER — Encounter: Payer: BC Managed Care – PPO | Admitting: *Deleted

## 2019-02-09 ENCOUNTER — Telehealth: Payer: Self-pay

## 2019-02-09 NOTE — Telephone Encounter (Signed)
Left message for patient to remind of missed remote transmission.  

## 2019-02-26 ENCOUNTER — Other Ambulatory Visit: Payer: Self-pay | Admitting: Internal Medicine

## 2019-03-07 ENCOUNTER — Other Ambulatory Visit: Payer: Self-pay | Admitting: Internal Medicine

## 2019-03-20 ENCOUNTER — Other Ambulatory Visit: Payer: Self-pay | Admitting: Internal Medicine

## 2019-03-21 ENCOUNTER — Other Ambulatory Visit: Payer: Self-pay

## 2019-03-21 MED ORDER — POTASSIUM CHLORIDE CRYS ER 20 MEQ PO TBCR: EXTENDED_RELEASE_TABLET | ORAL | 3 refills | Status: DC

## 2019-11-18 ENCOUNTER — Telehealth: Payer: Self-pay | Admitting: Internal Medicine

## 2019-11-18 NOTE — Telephone Encounter (Signed)
We are recommending the COVID-19 vaccine to all of our patients. Cardiac medications (including blood thinners) should not deter anyone from being vaccinated and there is no need to hold any of those medications prior to vaccine administration.     Currently, there is a hotline to call (active 08/12/19) to schedule vaccination appointments as no walk-ins will be accepted.   Number: [phone].    If an appointment is not available please go to [email or web address] to sign up for notification when additional vaccine appointments are available.   If you have further questions or concerns about the vaccine process, please visit [email or web address] or contact your primary care physician.  Patient Verbalized Understanding

## 2020-03-14 ENCOUNTER — Telehealth: Payer: Self-pay

## 2020-03-14 NOTE — Telephone Encounter (Signed)
The pt states she relocated and do want to be released to [facility].

## 2020-03-23 ENCOUNTER — Telehealth: Payer: Self-pay | Admitting: *Deleted

## 2020-03-23 MED ORDER — METOPROLOL SUCCINATE ER 50 MG PO TB24: ORAL_TABLET | ORAL | 3 refills | Status: DC

## 2020-03-23 NOTE — Telephone Encounter (Signed)
Refills sent in

## 2020-03-29 ENCOUNTER — Other Ambulatory Visit: Payer: Self-pay

## 2020-03-29 MED ORDER — MEXILETINE HCL 150 MG PO CAPS: 150.0000 mg | ORAL_CAPSULE | Freq: Two times a day (BID) | ORAL | 0 refills | Status: DC

## 2020-11-04 ENCOUNTER — Other Ambulatory Visit: Payer: Self-pay

## 2020-11-04 ENCOUNTER — Observation Stay (HOSPITAL_BASED_OUTPATIENT_CLINIC_OR_DEPARTMENT_OTHER)
Admission: EM | Admit: 2020-11-04 | Discharge: 2020-11-04 | Disposition: A | Discharge status: Home / Self Care | Payer: Managed Care, Other (non HMO) | Attending: Internal Medicine | Admitting: Internal Medicine

## 2020-11-04 DIAGNOSIS — Z45018 Encounter for adjustment and management of other part of cardiac pacemaker: Secondary | ICD-10-CM | POA: Diagnosis not present

## 2020-11-04 DIAGNOSIS — I483 Typical atrial flutter: Secondary | ICD-10-CM

## 2020-11-04 DIAGNOSIS — Z20822 Contact with and (suspected) exposure to covid-19: Secondary | ICD-10-CM | POA: Diagnosis not present

## 2020-11-04 DIAGNOSIS — I472 Ventricular tachycardia, unspecified: Secondary | ICD-10-CM

## 2020-11-04 DIAGNOSIS — R002 Palpitations: Secondary | ICD-10-CM | POA: Diagnosis present

## 2020-11-04 DIAGNOSIS — I4892 Unspecified atrial flutter: Principal | ICD-10-CM | POA: Insufficient documentation

## 2020-11-04 DIAGNOSIS — I4729 Other ventricular tachycardia: Secondary | ICD-10-CM

## 2020-11-04 DIAGNOSIS — I4581 Long QT syndrome: Secondary | ICD-10-CM | POA: Diagnosis not present

## 2020-11-04 LAB — CBC WITH DIFFERENTIAL/PLATELET
Abs Immature Granulocytes: 0.02 10*3/uL (ref 0.00–0.07)
Basophils Absolute: 0 10*3/uL (ref 0.0–0.1)
Basophils Relative: 0 %
Eosinophils Absolute: 0.1 10*3/uL (ref 0.0–0.5)
Eosinophils Relative: 1 %
HCT: 47 % — ABNORMAL HIGH (ref 36.0–46.0)
Hemoglobin: 16.1 g/dL — ABNORMAL HIGH (ref 12.0–15.0)
Immature Granulocytes: 0 %
Lymphocytes Relative: 49 %
Lymphs Abs: 3.6 10*3/uL (ref 0.7–4.0)
MCH: 32 pg (ref 26.0–34.0)
MCHC: 34.3 g/dL (ref 30.0–36.0)
MCV: 93.4 fL (ref 80.0–100.0)
Monocytes Absolute: 0.6 10*3/uL (ref 0.1–1.0)
Monocytes Relative: 8 %
Neutro Abs: 3.1 10*3/uL (ref 1.7–7.7)
Neutrophils Relative %: 42 %
Platelets: 237 10*3/uL (ref 150–400)
RBC: 5.03 MIL/uL (ref 3.87–5.11)
RDW: 12.1 % (ref 11.5–15.5)
WBC: 7.4 10*3/uL (ref 4.0–10.5)
nRBC: 0 % (ref 0.0–0.2)

## 2020-11-04 LAB — RESP PANEL BY RT-PCR (FLU A&B, COVID) ARPGX2
Influenza A by PCR: NEGATIVE
Influenza B by PCR: NEGATIVE
SARS Coronavirus 2 by RT PCR: NEGATIVE

## 2020-11-04 LAB — BASIC METABOLIC PANEL
Anion gap: 6 (ref 5–15)
BUN: 17 mg/dL (ref 6–20)
CO2: 26 mmol/L (ref 22–32)
Calcium: 9.7 mg/dL (ref 8.9–10.3)
Chloride: 111 mmol/L (ref 98–111)
Creatinine, Ser: 0.79 mg/dL (ref 0.44–1.00)
GFR, Estimated: >60 mL/min (ref >60)
Glucose, Bld: 114 mg/dL — ABNORMAL HIGH (ref 70–99)
Potassium: 3.9 mmol/L (ref 3.5–5.1)
Sodium: 143 mmol/L (ref 135–145)

## 2020-11-04 LAB — CBC
HCT: 46.8 % — ABNORMAL HIGH (ref 36.0–46.0)
Hemoglobin: 15.7 g/dL — ABNORMAL HIGH (ref 12.0–15.0)
MCH: 32.1 pg (ref 26.0–34.0)
MCHC: 33.5 g/dL (ref 30.0–36.0)
MCV: 95.7 fL (ref 80.0–100.0)
Platelets: 248 10*3/uL (ref 150–400)
RBC: 4.89 MIL/uL (ref 3.87–5.11)
RDW: 12.1 % (ref 11.5–15.5)
WBC: 6.3 10*3/uL (ref 4.0–10.5)
nRBC: 0 % (ref 0.0–0.2)

## 2020-11-04 LAB — TROPONIN I (HIGH SENSITIVITY): Troponin I (High Sensitivity): 10 ng/L (ref <18)

## 2020-11-04 LAB — MAGNESIUM: Magnesium: 2.1 mg/dL (ref 1.7–2.4)

## 2020-11-04 LAB — HIV ANTIBODY (ROUTINE TESTING W REFLEX): HIV Screen 4th Generation wRfx: NONREACTIVE

## 2020-11-04 LAB — TSH: TSH: 2.158 u[IU]/mL (ref 0.350–4.500)

## 2020-11-04 MED ORDER — METOPROLOL TARTRATE 5 MG/5ML IV SOLN
5.0000 mg | Freq: Once | INTRAVENOUS | Status: AC
  Administered 2020-11-04: 5 mg via INTRAVENOUS
  Filled 2020-11-04: qty 5

## 2020-11-04 MED ORDER — PROPOFOL 500 MG/50ML IV EMUL
INTRAVENOUS | Status: AC | PRN
  Administered 2020-11-04: 35 mg via INTRAVENOUS

## 2020-11-04 MED ORDER — POTASSIUM CHLORIDE CRYS ER 20 MEQ PO TBCR
40.0000 meq | EXTENDED_RELEASE_TABLET | Freq: Two times a day (BID) | ORAL | Status: DC
  Administered 2020-11-04: 40 meq via ORAL
  Filled 2020-11-04: qty 2

## 2020-11-04 MED ORDER — METOPROLOL SUCCINATE ER 25 MG PO TB24: 50.0000 mg | ORAL_TABLET | Freq: Every day | ORAL | Status: DC

## 2020-11-04 MED ORDER — METOPROLOL SUCCINATE ER 25 MG PO TB24
25.0000 mg | ORAL_TABLET | Freq: Every day | ORAL | Status: DC
  Administered 2020-11-04: 25 mg via ORAL
  Filled 2020-11-04: qty 1

## 2020-11-04 MED ORDER — ACETAMINOPHEN 325 MG PO TABS
650.0000 mg | ORAL_TABLET | ORAL | Status: DC | PRN
  Administered 2020-11-04: 650 mg via ORAL
  Filled 2020-11-04: qty 2

## 2020-11-04 MED ORDER — SODIUM CHLORIDE 0.9 % IV BOLUS
1000.0000 mL | Freq: Once | INTRAVENOUS | Status: AC
  Administered 2020-11-04: 1000 mL via INTRAVENOUS

## 2020-11-04 MED ORDER — PROPOFOL 10 MG/ML IV BOLUS
1.0000 mg/kg | Freq: Once | INTRAVENOUS | Status: AC
  Administered 2020-11-04: 35 mg via INTRAVENOUS
  Filled 2020-11-04: qty 20

## 2020-11-04 MED ORDER — ONDANSETRON HCL 4 MG/2ML IJ SOLN: 4.0000 mg | Freq: Once | INTRAMUSCULAR | Status: DC

## 2020-11-04 MED ORDER — MEXILETINE HCL 150 MG PO CAPS
150.0000 mg | ORAL_CAPSULE | Freq: Two times a day (BID) | ORAL | Status: DC
  Administered 2020-11-04: 150 mg via ORAL
  Filled 2020-11-04 (×2): qty 1

## 2020-11-04 MED ORDER — METOPROLOL SUCCINATE ER 25 MG PO TB24: 25.0000 mg | ORAL_TABLET | ORAL | Status: DC

## 2020-11-04 NOTE — ED Notes (Signed)
Pt states she didn't take her Metoprolol today.

## 2020-11-04 NOTE — ED Notes (Signed)
Pt cardioverted at 120J

## 2020-11-04 NOTE — ED Notes (Signed)
Per cardiology: monitor for additional 20-30 minutes for stable BP post sedation.

## 2020-11-04 NOTE — Consult Note (Signed)
CARDIOLOGY CONSULT NOTE  Patient ID: [name], MRN: [id number], DOB/AGE: 1966/05/07 55 y.o. Admit date: 11/04/2020 Date of Consult: 11/04/2020  Primary Physician: Patient, No Pcp Per (Inactive) Primary Cardiologist: [name] now in [place] MONEE DEMBECK is a 55 y.o. female who is seen 4/3  for the evaluation of ICD shock at the request of Dr Liane Comber.  Hx of aborted cardiac arrest 2009 2/2 VF; sinus node dysfunction and presumption of Brugada syndrome/LQT ( both in the chart and was on mex)   Concomitant SNDysfunction and received upgrade  DDD ICD; 2015 inappropriate shock 2/2 AFib RVR and then at flutter-typical w RVR 2016     Chief Complaint: palptiations   HPI [name] is a 55 y.o. female Developed palpitations yday during the [employer], thought it might be atrial flutter which she has had before  [facility]  Device interrogation >>AFlutter with 1:1 conduction ,ATP x 6 failed to have impact on rhythm.   Still out of rhytm with some SOB  Otherwise without functional limitations  DATE TEST EF   2/10 Echo   50-55 %         Date Cr K Hgb  4/22 0.79 3.9 15.7   Thromboembolic risk factors (, Gender-1) for a CHADSVASc Score of >=1     Past Medical History:  Diagnosis Date  . AICD (automatic cardioverter/defibrillator) present   . Atrial flutter (HCC)   . IUD (intrauterine device) in place 2016  . Long Q-T syndrome   . Myocardial infarction (HCC)    due to long QT  . Presence of permanent cardiac pacemaker   . Symptomatic bradycardia   . Ventricular fibrillation [facility])       Surgical History:  Past Surgical History:  Procedure Laterality Date  . ADENOIDECTOMY    . BILATERAL SALPINGECTOMY Bilateral 02/13/2015   Procedure: BILATERAL SALPINGECTOMY;  Surgeon: Ok Edwards, MD;  Location: [facility];  Service: Gynecology;  Laterality: Bilateral;  . CESAREAN SECTION  1994  . DILATION AND CURETTAGE OF UTERUS  1999  . IMPLANTABLE CARDIOVERTER  DEFIBRILLATOR (ICD) GENERATOR CHANGE N/A 11/18/2013   Procedure: ICD GENERATOR CHANGE;  Surgeon: Marinus Maw, MD;  Location: [facility] CATH LAB;  Service: Cardiovascular;  Laterality: N/A;  . IMPLANTABLE CARDIOVERTER DEFIBRILLATOR IMPLANT  2009; 11-18-13   MDT single chamber ICD implanted by Dr Ladona Ridgel 2009; upgrade to dual chamber MDT ICD 11/2013 by Dr Ladona Ridgel  . INTRAUTERINE DEVICE INSERTION  February 13, 2011   PARAGUARD   . LAPAROSCOPIC BILATERAL SALPINGECTOMY Bilateral 02/13/2015   Procedure: LAPAROSCOPIC BILATERAL SALPINGECTOMY;  Surgeon: Ok Edwards, MD;  Location: [facility];  Service: Gynecology;  Laterality: Bilateral;  . OOPHORECTOMY Right 02/13/2015   Procedure: OOPHORECTOMY;  Surgeon: Ok Edwards, MD;  Location: [facility];  Service: Gynecology;  Laterality: Right;  . TYMPANOSTOMY TUBE PLACEMENT    . wisdom teeeth        Home Meds: Prior to Admission medications   Medication Sig Start Date End Date Taking? Authorizing Provider  acetaminophen (TYLENOL) 500 MG tablet Take 1,000 mg by mouth every 6 (six) hours as needed for mild pain or headache.   Yes [provider]  ibuprofen (ADVIL,MOTRIN) 200 MG tablet Take 400 mg by mouth every 6 (six) hours as needed for headache or moderate pain. For pain   Yes [provider]  levonorgestrel (MIRENA, 52 MG,) 20 MCG/24HR IUD 1 Intra Uterine Device by Intrauterine route.   Yes [provider]  metoprolol succinate (TOPROL-XL) 50 MG 24 hr tablet TAKE 1/2 TABLET BY MOUTH EVERY MORNING AND TAKE 1 TABLET BY MOUTH EVERY NIGHT AT BEDTIME WITH OR IMMEDIATELY FOLLOWING A MEAL Patient taking differently: Take 25-50 mg by mouth See admin instructions. 25 mg in the morning and 50 mg hs 03/23/20  Yes Marinus Maw, MD  mexiletine (MEXITIL) 150 MG capsule Take 1 capsule (150 mg total) by mouth 2 (two) times daily. Please make overdue appt with Dr. Ladona Ridgel before anymore refills. 1st attempt 03/29/20  Yes Marinus Maw, MD  OVER THE COUNTER  MEDICATION Place 1 drop into both eyes daily as needed (dry/ irritated  eyes).   Yes [provider]  potassium chloride SA (K-DUR) 20 MEQ tablet TAKE 2 TABLETS BY MOUTH TWICE DAILY Patient taking differently: Take 40 mEq by mouth 2 (two) times daily. 03/22/19  Yes Marinus Maw, MD    Inpatient Medications:  . metoprolol succinate  25 mg Oral Daily  . metoprolol succinate  50 mg Oral QHS  . mexiletine  150 mg Oral BID  . potassium chloride SA  40 mEq Oral BID    Allergies: No Known Allergies  Social History   Socioeconomic History  . Marital status: Married    Spouse name: Not on file  . Number of children: Not on file  . Years of education: Not on file  . Highest education level: Not on file  Occupational History  . Not on file  Tobacco Use  . Smoking status: Never Smoker  . Smokeless tobacco: Never Used  Vaping Use  . Vaping Use: Never used  Substance and Sexual Activity  . Alcohol use: Yes    Comment: occasionally  . Drug use: No  . Sexual activity: Yes    Partners: Male    Birth control/protection: I.U.D.    Comment: PARAGARD  Other Topics Concern  . Not on file  Social History Narrative  . Not on file   Social Determinants of Health   Financial Resource Strain: Not on file  Food Insecurity: Not on file  Transportation Needs: Not on file  Physical Activity: Not on file  Stress: Not on file  Social Connections: Not on file  Intimate Partner Violence: Not on file     Family History  Problem Relation Age of Onset  . Hypertension Father   . Heart disease Father        PACEMAKER INSERTED  . Diabetes Maternal Grandmother   . Cancer Maternal Grandmother        COLON CANCER     ROS:  Please see the history of present illness.     All other systems reviewed and negative.    Physical Exam: Blood pressure 105/84, pulse (!) 113, temperature 98 F (36.7 C), temperature source Oral, resp. rate (!) 25, height 5\' 4"  (1.626 m), weight 69.9 kg, SpO2 98  %. General: Well developed, well nourished female in no acute distress. Head: Normocephalic, atraumatic, sclera non-icteric, no xanthomas, nares are without discharge. EENT: normal  Lymph Nodes:  none Neck: Negative for carotid bruits. JVD not elevated. Back:without scoliosis kyphosis Lungs: Clear bilaterally to auscultation without wheezes, rales, or rhonchi. Breathing is unlabored. Heart: Rapid but regular rate andrhythm  No  murmur . No rubs, or gallops appreciated. Abdomen: Soft, non-tender, non-distended with normoactive bowel sounds. No hepatomegaly. No rebound/guarding. No obvious abdominal masses. Msk:  Strength and tone appear normal for age. Extremities: No clubbing or cyanosis. No  edema.  Distal pedal pulses are 2+ and equal bilaterally. Skin: Warm and Dry Neuro: Alert and oriented X 3. CN III-XII intact Grossly normal sensory and motor function . Psych:  Responds to questions appropriately with a normal affect.      Labs: Chemistry Recent Labs  Lab 11/04/20 0335  NA 143  K 3.9  CL 111  CO2 26  GLUCOSE 114*  BUN 17  CREATININE 0.79  CALCIUM 9.7  GFRNONAA >60  ANIONGAP 6     Hematology Recent Labs  Lab 11/04/20 0035 11/04/20 0335  WBC 7.4 6.3  RBC 5.03 4.89  HGB 16.1* 15.7*  HCT 47.0* 46.8*  MCV 93.4 95.7  MCH 32.0 32.1  MCHC 34.3 33.5  RDW 12.1 12.1  PLT 237 248    Cardiac EnzymesNo results for input(s): TROPONINI in the last 168 hours. No results for input(s): TROPIPOC in the last 168 hours.   BNPNo results for input(s): BNP, PROBNP in the last 168 hours.   DDimer No results for input(s): DDIMER in the last 168 hours.   Lab Results  Component Value Date   CHOL 197 11/16/2014   HDL 47 11/16/2014   LDLCALC 133 (H) 11/16/2014   TRIG 87 11/16/2014    Thyroid Function Tests: Recent Labs    11/04/20 0359  TSH 2.158   Miscellaneous No results found for: DDIMER  Radiology/Studies: \  Estimated Creatinine Clearance: 76.3 mL/min (by C-G  formula based on SCr of 0.79 mg/dL).  No results found.  EKG: atrial flutter 2:1 @ 116  7/20  Sinus w RBBB/LAFB JTc about 450 msecc 7/16 Sinus with RBBB/LAFB  11/13 Sinus with QTc about 450  Telemetry Personally reviewed Atrial flutter more apparent     Assessment and Plan:  Aborted cardiac arrest  ICD dual chamber [employer]  Atrial flutter recurrent with inappropriate therapy 2015    ? Brugada   Bifascicular block   Atrial flutter with inappropriate therapy Efforts to pace terminate>> afib  Not stopping so will DCCV  A couple of issues  1) should have followup with EP to consider ablation for now recurrent aflutter, both for TE risk reduction and decreasing likelihood of inappropriate therapy 2) TE risk low enough and duration of event short enough does not need anticoagulation  3) there are parts of her story that are NOT consistent with LQTS as primary diagnosis  - sinus node dysfunction   - bifasciuclar block   - small R waves at implant  4) if LQTS is not the right diagnosis then perhaps mex is not necessary  Would recommend taht she be evaluated by someone with specific EP expertise with channelopathies  Device VT detection rate increased from 200>>214    Sherryl Manges

## 2020-11-04 NOTE — ED Provider Notes (Signed)
[facility] Provider Note: Tracy Dell, MD, FACEP  CSN: [id number] MRN: [id number] ARRIVAL: 11/04/20 at 0007 ROOM: MH02/MH02   CHIEF COMPLAINT  Palpitations   HISTORY OF PRESENT ILLNESS  11/04/20 12:34 AM [name] is a 55 y.o. female with an AICD/pacemaker.  She felt her heart will begin to race about an hour ago.  She states it feels like when she has been in atrial flutter in the past.  She denies any chest pain or shortness of breath.  She has been somewhat lightheaded but did not pass out.  She is not diaphoretic.  She has not felt her AICD fire.  Nursing staff has observed heart rate to be between the 110s and 130s.  The heart rate has sometimes been regular and sometimes irregular suggesting A. fib or a flutter.  She did not take her Toprol-XL 50 mg today.   Past Medical History:  Diagnosis Date  . AICD (automatic cardioverter/defibrillator) present   . Atrial flutter (HCC)   . IUD (intrauterine device) in place 2016  . Long Q-T syndrome   . Myocardial infarction (HCC)    due to long QT  . Presence of permanent cardiac pacemaker   . Symptomatic bradycardia   . Ventricular fibrillation [facility])     Past Surgical History:  Procedure Laterality Date  . ADENOIDECTOMY    . BILATERAL SALPINGECTOMY Bilateral 02/13/2015   Procedure: BILATERAL SALPINGECTOMY;  Surgeon: Ok Edwards, MD;  Location: [facility];  Service: Gynecology;  Laterality: Bilateral;  . CESAREAN SECTION  1994  . DILATION AND CURETTAGE OF UTERUS  1999  . IMPLANTABLE CARDIOVERTER DEFIBRILLATOR (ICD) GENERATOR CHANGE N/A 11/18/2013   Procedure: ICD GENERATOR CHANGE;  Surgeon: Marinus Maw, MD;  Location: [facility] CATH LAB;  Service: Cardiovascular;  Laterality: N/A;  . IMPLANTABLE CARDIOVERTER DEFIBRILLATOR IMPLANT  2009; 11-18-13   MDT single chamber ICD implanted by Dr Ladona Ridgel 2009; upgrade to dual chamber MDT ICD 11/2013 by Dr Ladona Ridgel  . INTRAUTERINE DEVICE INSERTION  February 13, 2011   PARAGUARD    . LAPAROSCOPIC BILATERAL SALPINGECTOMY Bilateral 02/13/2015   Procedure: LAPAROSCOPIC BILATERAL SALPINGECTOMY;  Surgeon: Ok Edwards, MD;  Location: [facility];  Service: Gynecology;  Laterality: Bilateral;  . OOPHORECTOMY Right 02/13/2015   Procedure: OOPHORECTOMY;  Surgeon: Ok Edwards, MD;  Location: [facility];  Service: Gynecology;  Laterality: Right;  . TYMPANOSTOMY TUBE PLACEMENT    . wisdom teeeth       Family History  Problem Relation Age of Onset  . Hypertension Father   . Heart disease Father        PACEMAKER INSERTED  . Diabetes Maternal Grandmother   . Cancer Maternal Grandmother        COLON CANCER    Social History   Tobacco Use  . Smoking status: Never Smoker  . Smokeless tobacco: Never Used  Vaping Use  . Vaping Use: Never used  Substance Use Topics  . Alcohol use: Yes    Comment: occasionally  . Drug use: No    Prior to Admission medications   Medication Sig Start Date End Date Taking? Authorizing Provider  acetaminophen (TYLENOL) 500 MG tablet Take 1,000 mg by mouth every 6 (six) hours as needed for mild pain or headache.    [provider]  ibuprofen (ADVIL,MOTRIN) 200 MG tablet Take 400 mg by mouth every 6 (six) hours as needed. For pain    [provider]  metoprolol succinate (TOPROL-XL) 50 MG 24 hr tablet  TAKE 1/2 TABLET BY MOUTH EVERY MORNING AND TAKE 1 TABLET BY MOUTH EVERY NIGHT AT BEDTIME WITH OR IMMEDIATELY FOLLOWING A MEAL 03/23/20   Marinus Maw, MD  mexiletine (MEXITIL) 150 MG capsule Take 1 capsule (150 mg total) by mouth 2 (two) times daily. Please make overdue appt with Dr. Ladona Ridgel before anymore refills. 1st attempt 03/29/20   Marinus Maw, MD  potassium chloride SA (K-DUR) 20 MEQ tablet TAKE 2 TABLETS BY MOUTH TWICE DAILY 03/22/19   Marinus Maw, MD  potassium chloride SA (K-DUR) 20 MEQ tablet TAKE 2 TABLETS BY MOUTH TWICE DAILY 03/21/19   Marinus Maw, MD    Allergies Patient has no known allergies.   REVIEW  OF SYSTEMS  Negative except as noted here or in the History of Present Illness.   PHYSICAL EXAMINATION  Initial Vital Signs Blood pressure (!) 157/93, pulse (!) 116, temperature 98.4 F (36.9 C), resp. rate 17, height 5\' 4"  (1.626 m), weight 69.9 kg, SpO2 96 %.  Examination General: Well-developed, well-nourished female in no acute distress; appearance consistent with age of record HENT: normocephalic; atraumatic Eyes: pupils equal, round and reactive to light; extraocular muscles intact Neck: supple Heart: Irregular rhythm with periods of regularity; tachycardia Lungs: clear to auscultation bilaterally Abdomen: soft; nondistended; nontender; bowel sounds present Extremities: No deformity; full range of motion; pulses normal Neurologic: Awake, alert and oriented; motor function intact in all extremities and symmetric; no facial droop Skin: Warm and dry Psychiatric: Anxious; tearful   RESULTS  Summary of this visit's results, reviewed and interpreted by myself:   EKG Interpretation  Date/Time:  Sunday November 04 2020 00:23:58 EDT Ventricular Rate:  116 PR Interval:  113 QRS Duration: 147 QT Interval:  349 QTC Calculation: 485 R Axis:   -76 Text Interpretation: Sinus or ectopic atrial tachycardia RBBB and LAFB LVH with secondary repolarization abnormality Inferior infarct, acute (RCA) Lateral leads are also involved Probable RV involvement, suggest recording right precordial leads Confirmed by Rozella Servello (07-04-1988) on 11/04/2020 12:33:15 AM      Laboratory Studies: Results for orders placed or performed during the hospital encounter of 11/04/20 (from the past 24 hour(s))  CBC with Differential     Status: Abnormal   Collection Time: 11/04/20 12:35 AM  Result Value Ref Range   WBC 7.4 4.0 - 10.5 K/uL   RBC 5.03 3.87 - 5.11 MIL/uL   Hemoglobin 16.1 (H) 12.0 - 15.0 g/dL   HCT 01/04/21 (H) 53.6 - 64.4 %   MCV 93.4 80.0 - 100.0 fL   MCH 32.0 26.0 - 34.0 pg   MCHC 34.3 30.0 - 36.0  g/dL   RDW 03.4 74.2 - 59.5 %   Platelets 237 150 - 400 K/uL   nRBC 0.0 0.0 - 0.2 %   Neutrophils Relative % 42 %   Neutro Abs 3.1 1.7 - 7.7 K/uL   Lymphocytes Relative 49 %   Lymphs Abs 3.6 0.7 - 4.0 K/uL   Monocytes Relative 8 %   Monocytes Absolute 0.6 0.1 - 1.0 K/uL   Eosinophils Relative 1 %   Eosinophils Absolute 0.1 0.0 - 0.5 K/uL   Basophils Relative 0 %   Basophils Absolute 0.0 0.0 - 0.1 K/uL   Immature Granulocytes 0 %   Abs Immature Granulocytes 0.02 0.00 - 0.07 K/uL   Imaging Studies: No results found.  ED COURSE and MDM  Nursing notes, initial and subsequent vitals signs, including pulse oximetry, reviewed and interpreted by myself.  Vitals:  11/04/20 0029 11/04/20 0030 11/04/20 0100  BP: (!) 157/93  117/79  Pulse: (!) 116  (!) 128  Resp: 17  19  Temp: 98.4 F (36.9 C)    SpO2: 96%  94%  Weight:  69.9 kg   Height:  5\' 4"  (1.626 m)    Medications  metoprolol tartrate (LOPRESSOR) injection 5 mg (5 mg Intravenous Given 11/04/20 0107)    1:09 AM Pacemaker interrogated and [employer] evaluated the record.  The patient had gone into ventricular tachycardia with a rate as high as 207.  The device was able to pace her out of the ventricular tachycardia without having to actually defibrillate her.  The patient is now showing a rhythm of a flutter with rapid ventricular response.  1:24 AM Discussed with Dr. 01/04/21 of cardiology.  He would like the patient transferred to the [facility] ED.  No inpatient beds are available currently.  Dr. Redge Gainer, EDP at [facility], made aware.   ASSESSMENT AND PLAN: Paroxysmal Atrial Fibrillation (ICD10:  I48.0) The patient's CHA2DS2-VASc score is 1, indicating a 0.6% annual risk of stroke.     PROCEDURES  Procedures CRITICAL CARE Performed by: [facility] Brandy Kabat Total critical care time: 30 minutes Critical care time was exclusive of separately billable procedures and treating other patients. Critical care was necessary to  treat or prevent imminent or life-threatening deterioration. Critical care was time spent personally by me on the following activities: development of treatment plan with patient and/or surrogate as well as nursing, discussions with consultants, evaluation of patient's response to treatment, examination of patient, obtaining history from patient or surrogate, ordering and performing treatments and interventions, ordering and review of laboratory studies, ordering and review of radiographic studies, pulse oximetry and re-evaluation of patient's condition.   ED DIAGNOSES     ICD-10-CM   1. Ventricular tachycardia (paroxysmal) (HCC)  I47.2   2. Atrial flutter with rapid ventricular response (HCC)  I48.92   3. Encounter for interrogation of cardiac pacemaker  Z45.018        Aurilla Coulibaly, Carlisle Beers, MD 11/04/20 [phone]

## 2020-11-04 NOTE — ED Notes (Signed)
[facility] after pt felt fluttering in her heart. Pt reports that she felt a "ballpoint pen tap my chest" and it felt like other times when she has had afib RVR. Pt currently AAOx4 with no complaints of CP, however, still feels the fluttering in her heart. No nausea.

## 2020-11-04 NOTE — ED Provider Notes (Signed)
.  Sedation  Date/Time: 11/04/2020 12:32 PM Performed by: Virgina Norfolk, DO Authorized by: Virgina Norfolk, DO   Consent:    Consent obtained:  Written and verbal   Consent given by:  Patient   Risks discussed:  Allergic reaction, dysrhythmia, inadequate sedation, nausea, prolonged hypoxia resulting in organ damage, prolonged sedation necessitating reversal, respiratory compromise necessitating ventilatory assistance and intubation and vomiting   Alternatives discussed:  Analgesia without sedation Universal protocol:    Procedure explained and questions answered to patient or proxy's satisfaction: yes     Relevant documents present and verified: yes     Immediately prior to procedure, a time out was called: yes   Indications:    Procedure performed:  Cardioversion Pre-sedation assessment:    Time since last food or drink:  6   NPO status caution: unable to specify NPO status     ASA classification: class 2 - patient with mild systemic disease     Mouth opening:  3 or more finger widths   Thyromental distance:  4 finger widths   Mallampati score:  I - soft palate, uvula, fauces, pillars visible   Neck mobility: normal     Pre-sedation assessments completed and reviewed: airway patency, cardiovascular function, hydration status, mental status, nausea/vomiting, pain level, respiratory function and temperature   Immediate pre-procedure details:    Reassessment: Patient reassessed immediately prior to procedure     Reviewed: vital signs, relevant labs/tests and NPO status     Verified: bag valve mask available, emergency equipment available, intubation equipment available, IV patency confirmed, oxygen available, reversal medications available and suction available   Procedure details (see MAR for exact dosages):    Preoxygenation:  Nasal cannula   Sedation:  Propofol   Intended level of sedation: deep and moderate (conscious sedation)   Intra-procedure monitoring:  Blood pressure monitoring,  cardiac monitor, continuous capnometry, continuous pulse oximetry and frequent LOC assessments   Total Provider sedation time (minutes):  20 Post-procedure details:    Post-sedation assessment completed:  11/04/2020 1:37 PM   Attendance: Constant attendance by certified staff until patient recovered     Recovery: Patient returned to pre-procedure baseline     Post-sedation assessments completed and reviewed: airway patency, cardiovascular function, hydration status, mental status, nausea/vomiting, pain level, respiratory function and temperature     Patient is stable for discharge or admission: yes     Procedure completion:  Tolerated well, no immediate complications      Virgina Norfolk, DO 11/04/20 1337

## 2020-11-04 NOTE — H&P (Signed)
Cardiology Admission History and Physical:   Patient ID: [name] MRN: [id number]; DOB: [birth year]   Admission date: 11/04/2020  Primary Care Provider: Patient, No Pcp Per (Inactive) Primary Cardiologist: Lewayne Bunting, MD  Primary Electrophysiologist:  None   Chief Complaint:  palpitations  Patient Profile:   [name] is a 55 y.o. 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   History of Present Illness:   [name] was watching the [employer] basketball game earlier in the evening and was feeling very stressed out near the end of the game.  She states that she began feeling palpitations and thought she was going into atrial flutter, which she has had once before.  She felt several "taps" by her defibrillator and her symptoms improved, however she persistently felt like something was not right with her heart.  She thus came to the emergency department for work-up.  In the med [facility] emergency department, the patient's device was interrogated and she was found to have an episode of VT at approximately 200 bpm.  She was successfully ATP'd out of this by her device, but after conversion she had been in atrial flutter persistently since that time.  She was transferred to the [facility] emergency department for evaluation.  In the [facility], ED, the patient states she still feels heart palpitations but otherwise feels fine.  She denies any recent fevers, chills, chest pain, chest pressure, shortness of breath, orthopnea, PND, leg swelling.  She has been taking all of her medications as prescribed, including her mexiletine and her metoprolol.  She denies any recent substance abuse.  Heart Pathway Score:     Past Medical History:  Diagnosis Date  . AICD (automatic cardioverter/defibrillator) present   . Atrial flutter (HCC)   . IUD (intrauterine device) in place 2016  . Long Q-T syndrome   .  Myocardial infarction (HCC)    due to long QT  . Presence of permanent cardiac pacemaker   . Symptomatic bradycardia   . Ventricular fibrillation [facility])     Past Surgical History:  Procedure Laterality Date  . ADENOIDECTOMY    . BILATERAL SALPINGECTOMY Bilateral 02/13/2015   Procedure: BILATERAL SALPINGECTOMY;  Surgeon: Ok Edwards, MD;  Location: [facility] ORS;  Service: Gynecology;  Laterality: Bilateral;  . CESAREAN SECTION  1994  . DILATION AND CURETTAGE OF UTERUS  1999  . IMPLANTABLE CARDIOVERTER DEFIBRILLATOR (ICD) GENERATOR CHANGE N/A 11/18/2013   Procedure: ICD GENERATOR CHANGE;  Surgeon: Marinus Maw, MD;  Location: [facility] CATH LAB;  Service: Cardiovascular;  Laterality: N/A;  . IMPLANTABLE CARDIOVERTER DEFIBRILLATOR IMPLANT  2009; 11-18-13   MDT single chamber ICD implanted by Dr Ladona Ridgel 2009; upgrade to dual chamber MDT ICD 11/2013 by Dr Ladona Ridgel  . INTRAUTERINE DEVICE INSERTION  February 13, 2011   PARAGUARD   . LAPAROSCOPIC BILATERAL SALPINGECTOMY Bilateral 02/13/2015   Procedure: LAPAROSCOPIC BILATERAL SALPINGECTOMY;  Surgeon: Ok Edwards, MD;  Location: [facility] ORS;  Service: Gynecology;  Laterality: Bilateral;  . OOPHORECTOMY Right 02/13/2015   Procedure: OOPHORECTOMY;  Surgeon: Ok Edwards, MD;  Location: [facility] ORS;  Service: Gynecology;  Laterality: Right;  . TYMPANOSTOMY TUBE PLACEMENT    . wisdom teeeth        Medications Prior to Admission: Prior to Admission medications   Medication Sig Start Date End Date Taking? Authorizing Provider  acetaminophen (TYLENOL) 500 MG tablet Take 1,000 mg by mouth every 6 (six)  hours as needed for mild pain or headache.    [provider]  ibuprofen (ADVIL,MOTRIN) 200 MG tablet Take 400 mg by mouth every 6 (six) hours as needed. For pain    [provider]  metoprolol succinate (TOPROL-XL) 50 MG 24 hr tablet TAKE 1/2 TABLET BY MOUTH EVERY MORNING AND TAKE 1 TABLET BY MOUTH EVERY NIGHT AT BEDTIME WITH OR IMMEDIATELY FOLLOWING A  MEAL 03/23/20   Marinus Maw, MD  mexiletine (MEXITIL) 150 MG capsule Take 1 capsule (150 mg total) by mouth 2 (two) times daily. Please make overdue appt with Dr. Ladona Ridgel before anymore refills. 1st attempt 03/29/20   Marinus Maw, MD  potassium chloride SA (K-DUR) 20 MEQ tablet TAKE 2 TABLETS BY MOUTH TWICE DAILY 03/22/19   Marinus Maw, MD  potassium chloride SA (K-DUR) 20 MEQ tablet TAKE 2 TABLETS BY MOUTH TWICE DAILY 03/21/19   Marinus Maw, MD     Allergies:   No Known Allergies  Social History:   Social History   Socioeconomic History  . Marital status: Married    Spouse name: Not on file  . Number of children: Not on file  . Years of education: Not on file  . Highest education level: Not on file  Occupational History  . Not on file  Tobacco Use  . Smoking status: Never Smoker  . Smokeless tobacco: Never Used  Vaping Use  . Vaping Use: Never used  Substance and Sexual Activity  . Alcohol use: Yes    Comment: occasionally  . Drug use: No  . Sexual activity: Yes    Partners: Male    Birth control/protection: I.U.D.    Comment: PARAGARD  Other Topics Concern  . Not on file  Social History Narrative  . Not on file   Social Determinants of Health   Financial Resource Strain: Not on file  Food Insecurity: Not on file  Transportation Needs: Not on file  Physical Activity: Not on file  Stress: Not on file  Social Connections: Not on file  Intimate Partner Violence: Not on file    Family History:   The patient's family history includes Cancer in her maternal grandmother; Diabetes in her maternal grandmother; Heart disease in her father; Hypertension in her father.    ROS:  Please see the history of present illness.  All other ROS reviewed and negative.     Physical Exam/Data:   Vitals:   11/04/20 0030 11/04/20 0100 11/04/20 0135 11/04/20 0248  BP:  117/79 127/82 137/80  Pulse:  (!) 128 (!) 101 (!) 113  Resp:  19 17 14   Temp:    98 F (36.7 C)   TempSrc:    Oral  SpO2:  94% 98% 97%  Weight: 69.9 kg     Height: 5\' 4"  (1.626 m)      No intake or output data in the 24 hours ending 11/04/20 0311 Last 3 Weights 11/04/2020 02/07/2019 04/27/2018  Weight (lbs) 154 lb 140 lb 141 lb  Weight (kg) 69.854 kg 63.504 kg 63.957 kg     Body mass index is 26.43 kg/m.  General:  Well nourished, well developed, in no acute distress Neck: no JVD Cardiac:  normal S1, S2; tachycardic; no murmur Lungs:  clear to auscultation bilaterally, no wheezing, rhonchi or rales  Abd: soft, nontender, no hepatomegaly  Ext: no edema Musculoskeletal:  No deformities, BUE and BLE strength normal and equal Skin: warm and dry  Neuro:  CNs 2-12 intact, no  focal abnormalities noted Psych:  Normal affect   EKG:  The ECG that was done on arrival to the ED was personally reviewed and demonstrates wide complex tachycardia, likely atrial flutter with RBBB and LAFB  Relevant CV Studies: N/A  Laboratory Data:  High Sensitivity Troponin:  No results for input(s): TROPONINIHS in the last 720 hours.    ChemistryNo results for input(s): NA, K, CL, CO2, GLUCOSE, BUN, CREATININE, CALCIUM, GFRNONAA, GFRAA, ANIONGAP in the last 168 hours.  No results for input(s): PROT, ALBUMIN, AST, ALT, ALKPHOS, BILITOT in the last 168 hours. Hematology Recent Labs  Lab 11/04/20 0035  WBC 7.4  RBC 5.03  HGB 16.1*  HCT 47.0*  MCV 93.4  MCH 32.0  MCHC 34.3  RDW 12.1  PLT 237   BNPNo results for input(s): BNP, PROBNP in the last 168 hours.  DDimer No results for input(s): DDIMER in the last 168 hours.   Radiology/Studies:  No results found. {  Assessment and Plan:  [name] is a 55 y.o. 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   We are still awaiting transmission of the patient's device interrogation from her initial ED visit.  The report received was that the patient had an episode of VT which  was treated with ATP and then converted to atrial flutter.  She remains in what appears to be atrial flutter at the current time and is minimally symptomatic and hemodynamically stable.  She does not take anticoagulation for her atrial flutter given her low risk for thromboembolism and the fact that atrial arrhythmia is a very infrequent occurrence for her.    We will admit her for observation, correct any underlying electrolyte abnormalities, and plan for her to see EP.  She may be able to be paced out of AFL or could potentially undergo electrical cardioversion without TEE since we have a clear time of onset according to her device interrogation.  #) Brugada/LQTS, VT - awaiting device interrogation - will correct K to >4 and Mg to >2 - cont home mexiletine 150mg  BID - cont home metoprolol 25mg  QAM and 50mg  QPM - check TSH - EP to see patient in AM  #) AFL - cont home meds as per above - would plan for attempt to pace atrium out of AFL or electrical cardioversion - not on AC due to low CHADS-VASc score and infrequent atrial arrhythmia  Severity of Illness: The appropriate patient status for this patient is OBSERVATION. Observation status is judged to be reasonable and necessary in order to provide the required intensity of service to ensure the patient's safety. The patient's presenting symptoms, physical exam findings, and initial radiographic and laboratory data in the context of their medical condition is felt to place them at decreased risk for further clinical deterioration. Furthermore, it is anticipated that the patient will be medically stable for discharge from the hospital within 2 midnights of admission. The following factors support the patient status of observation.   " The patient's presenting symptoms include palpitations. " The physical exam findings include euvolemia. " The initial radiographic and laboratory data are n/a.  For questions or updates, please contact [facility] Please consult [email or web address] for contact info under   Signed, , MD  11/04/2020 3:11 AM

## 2020-11-04 NOTE — Progress Notes (Signed)
Preop Dx atrial fib Post op DX  NSR  Procedure  DC Cardioversion   Pt was sedated by anesthesia receiving 35 mg Propafol  A synchronized shock 100 joules restored sinus Rhythm   Pt tolerated without difficulty

## 2020-11-04 NOTE — ED Triage Notes (Signed)
C/o "heart fluttering" 1 hour PTA. Pt watching [employer] basketball game when sx started. Pt does have medtronic IACD for prolonged QT interval.

## 2020-11-04 NOTE — Discharge Summary (Signed)
Discharge Summary    Patient ID: [name] MRN: [id number]; DOB: 1966-07-04  Admit date: 11/04/2020 Discharge date: 11/04/2020  PCP:  Patient, No Pcp Per (Inactive)   [facility]  Cardiologist:  Lewayne Bunting, MD   Discharge Diagnoses    Principal Problem:   Atrial flutter with rapid ventricular response [facility]) Active Problems:   Ventricular tachyarrhythmia [facility])   Diagnostic Studies/Procedures    None Performed this Admission.    History of Present Illness     [name] is a 55 y.o. female with past medical history of ventricular arrhythmias (s/p Medtronic ICD placement), Long QT Syndrome, history of remote atrial flutter and symptomatic bradycardia who presented to [facility] during the early morning hours of 11/04/2020 for evaluation of palpitations.  She reported developing palpitations while watching the [employer] [facility] game and symptoms persisted for over an hour which prompted her to seek evaluation. She denied any associated chest pain, dyspnea or syncope. Did report associated lightheadedness. Her ICD was interrogated and showed episodes of VT with HR as high as 207 and she was able to be paced out without being shocked. Her rhythm while in the ED was atrial flutter with RVR, therefore she was transferred to [facility] for further evaluation.    Hospital Course     Consultants: None  Upon arrival to [facility], she was still reporting associated palpitations while in atrial flutter. Labs showed Hgb stable at 16.1, K+ 3.9 and Mg 2.1. TSH 2.158 and Hs Troponin was negative at 10.  She was evaluated by Dr. Graciela Husbands who reviewed her device interrogation and her rhythm was consistent with atrial flutter with 1:1 conduction and ATP x6 failed. He recommended DCCV while in the ED given her persistent arrhythmia and this was successfully performed at 120 J. Was recommended she have follow-up with EP for consideration of ablation given her current  atrial flutter. Her VT detection rate was also increased from 200 to 214 on her device. She was last examined by Dr. Graciela Husbands and deemed stable for discharge. He recommended continuing her current medications and did not recommend anticoagulation given her low CHA2DS2-VASc Score of 1. A staff message has been sent to the office to arrange for hospital follow-up.   _____________  Discharge Vitals Blood pressure 92/63, pulse (!) 52, temperature (!) 97.4 F (36.3 C), temperature source Oral, resp. rate 20, height 5\' 4"  (1.626 m), weight 69.9 kg, SpO2 100 %.  Filed Weights   11/04/20 0030  Weight: 69.9 kg    Labs & Radiologic Studies    CBC Recent Labs    11/04/20 0035 11/04/20 0335  WBC 7.4 6.3  NEUTROABS 3.1  --   HGB 16.1* 15.7*  HCT 47.0* 46.8*  MCV 93.4 95.7  PLT 237 248   Basic Metabolic Panel Recent Labs    01/04/21 0335  NA 143  K 3.9  CL 111  CO2 26  GLUCOSE 114*  BUN 17  CREATININE 0.79  CALCIUM 9.7  MG 2.1   Liver Function Tests No results for input(s): AST, ALT, ALKPHOS, BILITOT, PROT, ALBUMIN in the last 72 hours. No results for input(s): LIPASE, AMYLASE in the last 72 hours. High Sensitivity Troponin:   Recent Labs  Lab 11/04/20 0400  TROPONINIHS 10    BNP Invalid input(s): POCBNP D-Dimer No results for input(s): DDIMER in the last 72 hours. Hemoglobin A1C No results for input(s): HGBA1C in the last 72 hours. Fasting Lipid Panel No results for  input(s): CHOL, HDL, LDLCALC, TRIG, CHOLHDL, LDLDIRECT in the last 72 hours. Thyroid Function Tests Recent Labs    11/04/20 0359  TSH 2.158   _____________  No results found. Disposition   Pt is being discharged home today in good condition.  Follow-up Plans & Appointments     Follow-up Information    Marinus Maw, MD Follow up.   Specialty: Cardiology Why: The office should contact you to arrange follow-up. If you do not hear from them within 2-3 business days, please contact the office.   Contact information: [address] [place] [address] [phone]                Discharge Medications   Allergies as of 11/04/2020   No Known Allergies     Medication List    STOP taking these medications   ibuprofen 200 MG tablet Commonly known as: ADVIL     TAKE these medications   acetaminophen 500 MG tablet Commonly known as: TYLENOL Take 1,000 mg by mouth every 6 (six) hours as needed for mild pain or headache.   metoprolol succinate 50 MG 24 hr tablet Commonly known as: TOPROL-XL TAKE 1/2 TABLET BY MOUTH EVERY MORNING AND TAKE 1 TABLET BY MOUTH EVERY NIGHT AT BEDTIME WITH OR IMMEDIATELY FOLLOWING A MEAL What changed:   how much to take  how to take this  when to take this  additional instructions   mexiletine 150 MG capsule Commonly known as: MEXITIL Take 1 capsule (150 mg total) by mouth 2 (two) times daily. Please make overdue appt with Dr. Ladona Ridgel before anymore refills. 1st attempt   Mirena (52 MG) 20 MCG/24HR IUD Generic drug: levonorgestrel 1 Intra Uterine Device by Intrauterine route.   OVER THE COUNTER MEDICATION Place 1 drop into both eyes daily as needed (dry/ irritated  eyes).   potassium chloride SA 20 MEQ tablet Commonly known as: KLOR-CON TAKE 2 TABLETS BY MOUTH TWICE DAILY          Outstanding Labs/Studies   None  Duration of Discharge Encounter   Greater than 30 minutes including physician time.  Signed, Ellsworth Lennox, PA-C 11/04/2020, 1:47 PM

## 2020-11-05 ENCOUNTER — Telehealth (HOSPITAL_COMMUNITY): Payer: Self-pay

## 2020-11-05 NOTE — Telephone Encounter (Signed)
Reached out to patient to schedule ED f/u for DCCV. Patient states she lives in [place] and has an Cardiologist there and will f/u with them.

## 2020-11-28 ENCOUNTER — Inpatient Hospital Stay (HOSPITAL_COMMUNITY)
Admission: EM | Admit: 2020-11-28 | Discharge: 2020-11-30 | DRG: 310 | Disposition: A | Discharge status: Home / Self Care | Payer: Managed Care, Other (non HMO) | Attending: Student in an Organized Health Care Education/Training Program | Admitting: Student in an Organized Health Care Education/Training Program

## 2020-11-28 ENCOUNTER — Other Ambulatory Visit: Payer: Self-pay

## 2020-11-28 ENCOUNTER — Telehealth: Payer: Self-pay | Admitting: Student in an Organized Health Care Education/Training Program

## 2020-11-28 ENCOUNTER — Emergency Department (HOSPITAL_COMMUNITY): Payer: Managed Care, Other (non HMO)

## 2020-11-28 ENCOUNTER — Encounter (HOSPITAL_COMMUNITY): Payer: Self-pay | Admitting: Emergency Medicine

## 2020-11-28 DIAGNOSIS — I4892 Unspecified atrial flutter: Principal | ICD-10-CM | POA: Diagnosis present

## 2020-11-28 DIAGNOSIS — I252 Old myocardial infarction: Secondary | ICD-10-CM

## 2020-11-28 DIAGNOSIS — I959 Hypotension, unspecified: Secondary | ICD-10-CM | POA: Diagnosis present

## 2020-11-28 DIAGNOSIS — Z20822 Contact with and (suspected) exposure to covid-19: Secondary | ICD-10-CM | POA: Diagnosis present

## 2020-11-28 DIAGNOSIS — I472 Ventricular tachycardia, unspecified: Secondary | ICD-10-CM

## 2020-11-28 DIAGNOSIS — Z950 Presence of cardiac pacemaker: Secondary | ICD-10-CM | POA: Diagnosis present

## 2020-11-28 DIAGNOSIS — Z95810 Presence of automatic (implantable) cardiac defibrillator: Secondary | ICD-10-CM

## 2020-11-28 DIAGNOSIS — I4891 Unspecified atrial fibrillation: Secondary | ICD-10-CM

## 2020-11-28 DIAGNOSIS — I495 Sick sinus syndrome: Secondary | ICD-10-CM

## 2020-11-28 DIAGNOSIS — Z79899 Other long term (current) drug therapy: Secondary | ICD-10-CM

## 2020-11-28 DIAGNOSIS — I4581 Long QT syndrome: Secondary | ICD-10-CM | POA: Diagnosis present

## 2020-11-28 DIAGNOSIS — I480 Paroxysmal atrial fibrillation: Secondary | ICD-10-CM | POA: Diagnosis present

## 2020-11-28 LAB — BASIC METABOLIC PANEL
Anion gap: 7 (ref 5–15)
BUN: 19 mg/dL (ref 6–20)
CO2: 25 mmol/L (ref 22–32)
Calcium: 9.4 mg/dL (ref 8.9–10.3)
Chloride: 107 mmol/L (ref 98–111)
Creatinine, Ser: 0.93 mg/dL (ref 0.44–1.00)
GFR, Estimated: >60 mL/min (ref >60)
Glucose, Bld: 118 mg/dL — ABNORMAL HIGH (ref 70–99)
Potassium: 3.4 mmol/L — ABNORMAL LOW (ref 3.5–5.1)
Sodium: 139 mmol/L (ref 135–145)

## 2020-11-28 LAB — CBC
HCT: 46.7 % — ABNORMAL HIGH (ref 36.0–46.0)
Hemoglobin: 15.4 g/dL — ABNORMAL HIGH (ref 12.0–15.0)
MCH: 32.1 pg (ref 26.0–34.0)
MCHC: 33 g/dL (ref 30.0–36.0)
MCV: 97.3 fL (ref 80.0–100.0)
Platelets: 254 10*3/uL (ref 150–400)
RBC: 4.8 MIL/uL (ref 3.87–5.11)
RDW: 12.6 % (ref 11.5–15.5)
WBC: 6.5 10*3/uL (ref 4.0–10.5)
nRBC: 0 % (ref 0.0–0.2)

## 2020-11-28 LAB — TROPONIN I (HIGH SENSITIVITY): Troponin I (High Sensitivity): 15 ng/L (ref <18)

## 2020-11-28 NOTE — ED Triage Notes (Addendum)
Emergency Medicine Provider Triage Evaluation Note  [name] , a 55 y.o. female  was evaluated in triage.  Pt complains of palpations, SHOB. Hx afib, pacer/defib, see [place], called and was directed to the ER.   Review of Systems  Positive: Palpations, SHOB Negative: CP  Physical Exam  BP (!) 133/94 (BP Location: Right Arm)   Pulse (!) 122   Temp 98.5 F (36.9 C) (Oral)   Resp 18   SpO2 98%  Gen:   Awake, no distress   HEENT:  Atraumatic  Resp:  Normal effort  Cardiac:  Rate 120s Abd:   Nondistended, nontender  MSK:   Moves extremities without difficulty  Neuro:  Speech clear   Medical Decision Making  Medically screening exam initiated at 9:05 PM.  Appropriate orders placed.  AVELINA MCCLURKIN was informed that the remainder of the evaluation will be completed by another provider, this initial triage assessment does not replace that evaluation, and the importance of remaining in the ED until their evaluation is complete.  Clinical Impression  Reviewed note in record from Dr. Cherly Beach, send message to inform of patient's arrival and pending device interrogation.     Jeannie Fend, PA-C 11/28/20 2109    Jeannie Fend, PA-C 11/28/20 2113

## 2020-11-28 NOTE — Telephone Encounter (Signed)
Paged by operator to call patient.  Patient is living in [place], previous patient of Dr. Ladona Ridgel. Has transferred care to [place] now. Came here to visit daughter and 2 days ago on trip she felt that she was having some palpitations, Monday morning. Felt winded today when walking her dog, has not felt this before, feels weak. Medtronic device in place. Feels like her heart is fluttering like it did before. She was recently cardioverted and had similar palpitations.   She said that she made a remote transmission and called [place], unsure where this transmitted to, offered to call the rep to figure out if we could have it sent to [place]. She is pulling in to the ED now so might be easiest just to interrogate device here. Will f/u with ED provider.

## 2020-11-28 NOTE — ED Triage Notes (Signed)
Pt reports she feels like she is in AFIB. Reports she feels "winded."  Denies any other symptoms at this time.

## 2020-11-29 ENCOUNTER — Encounter (HOSPITAL_COMMUNITY): Payer: Self-pay | Admitting: Cardiology

## 2020-11-29 DIAGNOSIS — I484 Atypical atrial flutter: Secondary | ICD-10-CM | POA: Diagnosis not present

## 2020-11-29 DIAGNOSIS — I252 Old myocardial infarction: Secondary | ICD-10-CM | POA: Diagnosis not present

## 2020-11-29 DIAGNOSIS — I495 Sick sinus syndrome: Secondary | ICD-10-CM | POA: Diagnosis present

## 2020-11-29 DIAGNOSIS — Z79899 Other long term (current) drug therapy: Secondary | ICD-10-CM | POA: Diagnosis not present

## 2020-11-29 DIAGNOSIS — I480 Paroxysmal atrial fibrillation: Secondary | ICD-10-CM | POA: Diagnosis present

## 2020-11-29 DIAGNOSIS — Z20822 Contact with and (suspected) exposure to covid-19: Secondary | ICD-10-CM | POA: Diagnosis present

## 2020-11-29 DIAGNOSIS — I4581 Long QT syndrome: Secondary | ICD-10-CM | POA: Diagnosis present

## 2020-11-29 DIAGNOSIS — I4892 Unspecified atrial flutter: Secondary | ICD-10-CM | POA: Diagnosis present

## 2020-11-29 DIAGNOSIS — I959 Hypotension, unspecified: Secondary | ICD-10-CM | POA: Diagnosis present

## 2020-11-29 DIAGNOSIS — Z95810 Presence of automatic (implantable) cardiac defibrillator: Secondary | ICD-10-CM | POA: Diagnosis not present

## 2020-11-29 LAB — RESP PANEL BY RT-PCR (FLU A&B, COVID) ARPGX2
Influenza A by PCR: NEGATIVE
Influenza B by PCR: NEGATIVE
SARS Coronavirus 2 by RT PCR: NEGATIVE

## 2020-11-29 LAB — TROPONIN I (HIGH SENSITIVITY): Troponin I (High Sensitivity): 16 ng/L (ref <18)

## 2020-11-29 MED ORDER — ACETAMINOPHEN 325 MG PO TABS: 650.0000 mg | ORAL_TABLET | ORAL | Status: DC | PRN

## 2020-11-29 MED ORDER — SODIUM CHLORIDE 0.9 % IV SOLN: INTRAVENOUS | Status: DC

## 2020-11-29 MED ORDER — METOPROLOL TARTRATE 25 MG PO TABS
25.0000 mg | ORAL_TABLET | Freq: Four times a day (QID) | ORAL | Status: DC
  Administered 2020-11-29: 25 mg via ORAL
  Filled 2020-11-29: qty 1

## 2020-11-29 MED ORDER — APIXABAN 5 MG PO TABS
5.0000 mg | ORAL_TABLET | Freq: Two times a day (BID) | ORAL | Status: DC
  Administered 2020-11-29 – 2020-11-30 (×4): 5 mg via ORAL
  Filled 2020-11-29 (×4): qty 1

## 2020-11-29 MED ORDER — POTASSIUM CHLORIDE CRYS ER 20 MEQ PO TBCR
40.0000 meq | EXTENDED_RELEASE_TABLET | Freq: Once | ORAL | Status: AC
  Administered 2020-11-29: 40 meq via ORAL
  Filled 2020-11-29: qty 2

## 2020-11-29 MED ORDER — MEXILETINE HCL 150 MG PO CAPS
150.0000 mg | ORAL_CAPSULE | Freq: Two times a day (BID) | ORAL | Status: DC
  Administered 2020-11-29 – 2020-11-30 (×3): 150 mg via ORAL
  Filled 2020-11-29 (×6): qty 1

## 2020-11-29 MED ORDER — METOPROLOL SUCCINATE ER 50 MG PO TB24
50.0000 mg | ORAL_TABLET | Freq: Two times a day (BID) | ORAL | Status: DC
  Administered 2020-11-30: 50 mg via ORAL
  Filled 2020-11-29: qty 1

## 2020-11-29 MED ORDER — METOPROLOL SUCCINATE ER 25 MG PO TB24
25.0000 mg | ORAL_TABLET | Freq: Once | ORAL | Status: AC
  Administered 2020-11-29: 25 mg via ORAL
  Filled 2020-11-29: qty 1

## 2020-11-29 MED ORDER — ACETAMINOPHEN 325 MG PO TABS
650.0000 mg | ORAL_TABLET | Freq: Once | ORAL | Status: AC
  Administered 2020-11-29: 650 mg via ORAL
  Filled 2020-11-29: qty 2

## 2020-11-29 MED ORDER — METOPROLOL TARTRATE 25 MG PO TABS: 25.0000 mg | ORAL_TABLET | Freq: Once | ORAL | Status: DC

## 2020-11-29 NOTE — H&P (Signed)
Cardiology Admission History and Physical:   Patient ID: [name] MRN: [id number]; DOB: September 01, 1965   Admission date: 11/28/2020  Primary Care Provider: Patient, No Pcp Per (Inactive) [facility] Cardiologist: Lewayne Bunting, MD  [facility] Electrophysiologist:  None   Chief Complaint: palpitations/fatigue  Patient Profile:   [name] is a 55 y.o. female with prior VF arrest s/p MT ICD, possible long QT syndrome, and recurrent AFL who presents with 3 days of intermittent palpitations and fatigue.   History of Present Illness:   [name] was recently seen in the ED on 11/04/20 for similar symptoms and was found to be in 2:1 AFL for which she was cardioverted and discharged home.  She recently moved to [place] and was establishing care with a cardiology group there however was back in town to visit her daughter who lives locally.  She said on Monday during her drive to [place] as she started to notice occasional palpitations and generalized fatigue similar to prior episodes.  She denied any chest pain, shortness of breath, orthopnea or PND.  She attempted to call the local cardiologist office however was unable to reach anyone.  She also tried to remote transmit her ICD data.  Because she was unable to reach you by she presented to the hospital for further evaluation.  Device interrogation revealed recurrent AFL which started Sunday evening.  She is not on any OAC with C2V (1).  Past Medical History:  Diagnosis Date  . AICD (automatic cardioverter/defibrillator) present   . Atrial flutter (HCC)   . IUD (intrauterine device) in place 2016  . Long Q-T syndrome   . Myocardial infarction (HCC)    due to long QT  . Presence of permanent cardiac pacemaker   . Symptomatic bradycardia   . Ventricular fibrillation [facility])    Past Surgical History:  Procedure Laterality Date  . ADENOIDECTOMY    . BILATERAL SALPINGECTOMY Bilateral 02/13/2015   Procedure: BILATERAL  SALPINGECTOMY;  Surgeon: Ok Edwards, MD;  Location: [facility];  Service: Gynecology;  Laterality: Bilateral;  . CESAREAN SECTION  1994  . DILATION AND CURETTAGE OF UTERUS  1999  . IMPLANTABLE CARDIOVERTER DEFIBRILLATOR (ICD) GENERATOR CHANGE N/A 11/18/2013   Procedure: ICD GENERATOR CHANGE;  Surgeon: Marinus Maw, MD;  Location: [facility] CATH LAB;  Service: Cardiovascular;  Laterality: N/A;  . IMPLANTABLE CARDIOVERTER DEFIBRILLATOR IMPLANT  2009; 11-18-13   MDT single chamber ICD implanted by Dr Ladona Ridgel 2009; upgrade to dual chamber MDT ICD 11/2013 by Dr Ladona Ridgel  . INTRAUTERINE DEVICE INSERTION  February 13, 2011   PARAGUARD   . LAPAROSCOPIC BILATERAL SALPINGECTOMY Bilateral 02/13/2015   Procedure: LAPAROSCOPIC BILATERAL SALPINGECTOMY;  Surgeon: Ok Edwards, MD;  Location: [facility];  Service: Gynecology;  Laterality: Bilateral;  . OOPHORECTOMY Right 02/13/2015   Procedure: OOPHORECTOMY;  Surgeon: Ok Edwards, MD;  Location: [facility];  Service: Gynecology;  Laterality: Right;  . TYMPANOSTOMY TUBE PLACEMENT    . wisdom teeeth       Medications Prior to Admission: Prior to Admission medications   Medication Sig Start Date End Date Taking? Authorizing Provider  acetaminophen (TYLENOL) 500 MG tablet Take 1,000 mg by mouth every 6 (six) hours as needed for mild pain or headache.   Yes [provider]  ibuprofen (ADVIL) 200 MG tablet Take 400 mg by mouth every 6 (six) hours as needed for headache or moderate pain.   Yes [provider]  metoprolol succinate (TOPROL-XL) 50 MG 24 hr tablet  TAKE 1/2 TABLET BY MOUTH EVERY MORNING AND TAKE 1 TABLET BY MOUTH EVERY NIGHT AT BEDTIME WITH OR IMMEDIATELY FOLLOWING A MEAL Patient taking differently: Take 25-50 mg by mouth See admin instructions. 25 mg in the morning and 50 mg hs 03/23/20  Yes Marinus Maw, MD  mexiletine (MEXITIL) 150 MG capsule Take 1 capsule (150 mg total) by mouth 2 (two) times daily. Please make overdue appt with Dr. Ladona Ridgel  before anymore refills. 1st attempt 03/29/20  Yes Marinus Maw, MD  OVER THE COUNTER MEDICATION Place 1 drop into both eyes daily as needed (dry/ irritated  eyes).   Yes [provider]  potassium chloride SA (K-DUR) 20 MEQ tablet TAKE 2 TABLETS BY MOUTH TWICE DAILY Patient taking differently: Take 40 mEq by mouth 2 (two) times daily. 03/22/19  Yes Marinus Maw, MD  levonorgestrel (MIRENA, 52 MG,) 20 MCG/24HR IUD 1 Intra Uterine Device by Intrauterine route.    [provider]    Allergies:   No Known Allergies  Social History:   Social History   Socioeconomic History  . Marital status: Married    Spouse name: Not on file  . Number of children: Not on file  . Years of education: Not on file  . Highest education level: Not on file  Occupational History  . Not on file  Tobacco Use  . Smoking status: Never Smoker  . Smokeless tobacco: Never Used  Vaping Use  . Vaping Use: Never used  Substance and Sexual Activity  . Alcohol use: Yes    Comment: occasionally  . Drug use: No  . Sexual activity: Yes    Partners: Male    Birth control/protection: I.U.D.    Comment: PARAGARD  Other Topics Concern  . Not on file  Social History Narrative  . Not on file   Social Determinants of Health   Financial Resource Strain: Not on file  Food Insecurity: Not on file  Transportation Needs: Not on file  Physical Activity: Not on file  Stress: Not on file  Social Connections: Not on file  Intimate Partner Violence: Not on file    Family History:   The patient's family history includes Cancer in her maternal grandmother; Diabetes in her maternal grandmother; Heart disease in her father; Hypertension in her father.    ROS:   Review of Systems: [y] = yes, [ ]  = no       General: Weight gain [ ] ; Weight loss [ ] ; Anorexia [ ] ; Fatigue [y]; Fever [ ] ; Chills [ ] ; Weakness [ ]     Cardiac: Chest pain/pressure [ ] ; Resting SOB [ ] ; Exertional SOB [ ] ; Orthopnea [ ] ;  Pedal Edema [ ] ; Palpitations [ ] ; Syncope [ ] ; Presyncope [ ] ; Paroxysmal nocturnal dyspnea [ ]     Pulmonary: Cough [ ] ; Wheezing [ ] ; Hemoptysis [ ] ; Sputum [ ] ; Snoring [ ]     GI: Vomiting [ ] ; Dysphagia [ ] ; Melena [ ] ; Hematochezia [ ] ; Heartburn [ ] ; Abdominal pain [ ] ; Constipation [ ] ; Diarrhea [ ] ; BRBPR [ ]     GU: Hematuria [ ] ; Dysuria [ ] ; Nocturia [ ]   Vascular: Pain in legs with walking [ ] ; Pain in feet with lying flat [ ] ; Non-healing sores [ ] ; Stroke [ ] ; TIA [ ] ; Slurred speech [ ] ;    Neuro: Headaches [ ] ; Vertigo [ ] ; Seizures [ ] ; Paresthesias [ ] ;Blurred vision [ ] ; Diplopia [ ] ; Vision changes [ ]   Ortho/Skin: Arthritis [ ] ; Joint pain [ ] ; Muscle pain [ ] ; Joint swelling [ ] ; Back Pain [ ] ; Rash [ ]     Psych: Depression [ ] ; Anxiety [ ]     Heme: Bleeding problems [ ] ; Clotting disorders [ ] ; Anemia [ ]     Endocrine: Diabetes [ ] ; Thyroid dysfunction [ ]    Physical Exam/Data:   Vitals:   11/29/20 0230 11/29/20 0300 11/29/20 0345 11/29/20 0445  BP: 104/80 103/78  95/73  Pulse: (!) 113 (!) 112 (!) 110 (!) 109  Resp: 12 (!) 23 19 20   Temp:      TempSrc:      SpO2: 98% 96% 97% 97%  Weight:      Height:       No intake or output data in the 24 hours ending 11/29/20 0617 Last 3 Weights 11/28/2020 11/04/2020 02/07/2019  Weight (lbs) 155 lb 154 lb 140 lb  Weight (kg) 70.308 kg 69.854 kg 63.504 kg     Body mass index is 26.61 kg/m.  General:  Well nourished, well developed, in no acute distress  HEENT: normal Lymph: no adenopathy Neck: no JVD Endocrine:  No thryomegaly Vascular: No carotid bruits; FA pulses 2+ bilaterally without bruits  Cardiac:  normal S1, S2; accelerated rate, irregular rhythm Lungs:  clear to auscultation bilaterally, no wheezing, rhonchi or rales  Abd: soft, nontender, no hepatomegaly  Ext: no LE edema Musculoskeletal:  No deformities, BUE and BLE strength normal and equal Skin: warm and dry  Neuro:  CNs 2-12 intact, no focal  abnormalities noted Psych:  Normal affect   EKG:  The ECG that was done reveals 2:1 AFL  Relevant CV Studies: None recent  Laboratory Data:  High Sensitivity Troponin:   Recent Labs  Lab 11/04/20 0400 11/28/20 2107 11/28/20 2322  TROPONINIHS 10 15 16       Chemistry Recent Labs  Lab 11/28/20 2107  NA 139  K 3.4*  CL 107  CO2 25  GLUCOSE 118*  BUN 19  CREATININE 0.93  CALCIUM 9.4  GFRNONAA >60  ANIONGAP 7    No results for input(s): PROT, ALBUMIN, AST, ALT, ALKPHOS, BILITOT in the last 168 hours. Hematology Recent Labs  Lab 11/28/20 2107  WBC 6.5  RBC 4.80  HGB 15.4*  HCT 46.7*  MCV 97.3  MCH 32.1  MCHC 33.0  RDW 12.6  PLT 254   BNPNo results for input(s): BNP, PROBNP in the last 168 hours.  DDimer No results for input(s): DDIMER in the last 168 hours.  Radiology/Studies:  DG Chest 2 View  Result Date: 11/28/2020 CLINICAL DATA:  55 year old female with atrial fibrillation EXAM: CHEST - 2 VIEW COMPARISON:  Chest radiograph dated 11/18/2013 FINDINGS: No focal consolidation, pleural effusion, pneumothorax. The cardiac silhouette is within limits. Left pectoral AICD device. No acute osseous pathology. IMPRESSION: No active cardiopulmonary disease. Electronically Signed   By: 11/30/2020 M.D.   On: 11/28/2020 21:29   Assessment and Plan:   1. AFL  This is a second episode in the past month of atrial flutter that has resulted in ED evaluation for Ms. Granlund.  She was not on [facility] following her last episode as it was less than 48 hours duration and her C2V was only (1).  She is interested in pursuing more definitive therapy for her atrial arrhythmias and would like to consider ablation with our group since she comes up here once a month and already has a relationship with 01/04/21.  In the meantime  I think it would be reasonable to proceed with TEE/DCCV given that her rates are 110-130 and she is mildly symptomatic.  I increased her metoprolol from Toprol-XL 25/50 to  50/50.  We discussed medications that could be used in the short-term to try and limit her chance of going back in atrial flutter.  I think amiodarone would be reasonable in the short-term to try and keep her from going back into atrial flutter as frequently however with the questionable diagnosis of long QT syndrome I will defer this to the rounding team.  Given that her most recent episode of AFL was over 4 days duration along with that but she was just recently cardioverted and that she will likely undergo ablation in the next 4 to 8 weeks I went ahead and started her on Eliquis. - increased toprol XL 25 mg qAM/50 mg qhs to 50 mg qAM/50 mg qhs - start eliquis 5 mg PO q12h (FD given 0154 this morning)  - TEE/DCCV today pending scheduling availability  2. VT ICD without any ventricular arrhythmias.   3. ?Long QT syndrome Dr. Graciela Husbands recently saw her in the ED and there was question as to whether her ECG was c/w this prior dx. She will f/u locally for additional evaluation, continue on home mexiletine for now.   Severity of Illness: The appropriate patient status for this patient is INPATIENT. Inpatient status is judged to be reasonable and necessary in order to provide the required intensity of service to ensure the patient's safety. The patient's presenting symptoms, physical exam findings, and initial radiographic and laboratory data in the context of their chronic comorbidities is felt to place them at high risk for further clinical deterioration. Furthermore, it is not anticipated that the patient will be medically stable for discharge from the hospital within 2 midnights of admission. The following factors support the patient status of inpatient.   " The patient's presenting symptoms include fatigue/palpitations. " The worrisome physical exam findings include accelerated HR. " The initial radiographic and laboratory data are worrisome because of n/a. " The chronic co-morbidities include atrial  flutter, VT.  * I certify that at the point of admission it is my clinical judgment that the patient will require inpatient hospital care spanning beyond 2 midnights from the point of admission due to high intensity of service, high risk for further deterioration and high frequency of surveillance required.*   For questions or updates, please contact [facility] Please consult [email or web address] for contact info under   Signed, Linton Rump, MD  11/29/2020 6:17 AM

## 2020-11-29 NOTE — Progress Notes (Signed)
   11/29/20 2006  Assess: MEWS Score  Temp 98.7 F (37.1 C)  BP 102/83  Pulse Rate (!) 121  Resp 18  Level of Consciousness Alert  SpO2 99 %  O2 Device Room Air  Assess: MEWS Score  MEWS Temp 0  MEWS Systolic 0  MEWS Pulse 2  MEWS RR 0  MEWS LOC 0  MEWS Score 2  MEWS Score Color Yellow  Assess: if the MEWS score is Yellow or Red  Were vital signs taken at a resting state? Yes  Focused Assessment No change from prior assessment  Early Detection of Sepsis Score *See Row Information* Low  MEWS guidelines implemented *See Row Information* No, previously yellow, continue vital signs every 4 hours  Treat  Pain Scale 0-10  Pain Score 0  Notify: Charge Nurse/RN  Name of Charge Nurse/RN Notified [place] RN  Date Charge Nurse/RN Notified 11/29/20  Time Charge Nurse/RN Notified 2010  Document  Patient Outcome Not stable and remains on department  Progress note created (see row info) Yes

## 2020-11-29 NOTE — Progress Notes (Addendum)
Progress Note  Patient Name: [name] Date of Encounter: 11/29/2020  [facility] Cardiologist: Lewayne Bunting, MD   Subjective   Still in Aflutter with rates 100-120, however she is hypotensive. Overall feeling better. No chest pain or SOB.  Inpatient Medications    Scheduled Meds: . apixaban  5 mg Oral BID  . [START ON 11/30/2020] metoprolol succinate  50 mg Oral BID  . mexiletine  150 mg Oral Q12H   Continuous Infusions:  PRN Meds: acetaminophen   Vital Signs    Vitals:   11/29/20 0730 11/29/20 0744 11/29/20 0830 11/29/20 0900  BP: 99/82  110/87 91/80  Pulse: (!) 111  (!) 114 (!) 111  Resp: 15  14 18   Temp:  98 F (36.7 C)    TempSrc:  Oral    SpO2: 97%  98% 98%  Weight:      Height:       No intake or output data in the 24 hours ending 11/29/20 0916 Last 3 Weights 11/28/2020 11/04/2020 02/07/2019  Weight (lbs) 155 lb 154 lb 140 lb  Weight (kg) 70.308 kg 69.854 kg 63.504 kg      Telemetry    Aflutter Hr 110-120 - Personally Reviewed  ECG    Aflutter, 115bpm, IVCD  - Personally Reviewed  Physical Exam   GEN: No acute distress.   Neck: No JVD Cardiac: Reg Irreg, no murmurs, rubs, or gallops.  Respiratory: Clear to auscultation bilaterally. GI: Soft, nontender, non-distended  MS: No edema; No deformity. Neuro:  Nonfocal  Psych: Normal affect   Labs    High Sensitivity Troponin:   Recent Labs  Lab 11/04/20 0400 11/28/20 2107 11/28/20 2322  TROPONINIHS 10 15 16       Chemistry Recent Labs  Lab 11/28/20 2107  NA 139  K 3.4*  CL 107  CO2 25  GLUCOSE 118*  BUN 19  CREATININE 0.93  CALCIUM 9.4  GFRNONAA >60  ANIONGAP 7     Hematology Recent Labs  Lab 11/28/20 2107  WBC 6.5  RBC 4.80  HGB 15.4*  HCT 46.7*  MCV 97.3  MCH 32.1  MCHC 33.0  RDW 12.6  PLT 254    BNPNo results for input(s): BNP, PROBNP in the last 168 hours.   DDimer No results for input(s): DDIMER in the last 168 hours.   Radiology    DG Chest 2  View  Result Date: 11/28/2020 CLINICAL DATA:  55 year old female with atrial fibrillation EXAM: CHEST - 2 VIEW COMPARISON:  Chest radiograph dated 11/18/2013 FINDINGS: No focal consolidation, pleural effusion, pneumothorax. The cardiac silhouette is within limits. Left pectoral AICD device. No acute osseous pathology. IMPRESSION: No active cardiopulmonary disease. Electronically Signed   By: 53 M.D.   On: 11/28/2020 21:29    Cardiac Studies   Echo ordered  Patient Profile     55 y.o. female with pmh of prior VF arrest s/p MT ICD, possible long QT syndrome, recurrent Aflutter  Assessment & Plan    Aflutter - second episode in the last month. Unclear trigger for aflutter. First episode not started on [facility] due to [facility] of 1 (female) - ICD interrogation this admission showed aflutter since Sunday night - Started on Eliquis 5mg  BID for possible TEE/DCCV - Metoprolol increased to 50mg  BID however she is hypotensive. - Still in aflutter with rates 100-120, she is asymptomatic - Will discuss initiation of amiodarone with MD given hypotension although is on mexiletine -  TEE/DCCV scheduled for tomorrow 4/29,  however patient wanting to go home tomorrow since she has an important family event Satuday.  - Another complicated situation since she lives in [place], but wants to follow with cardiology here. She has seen Dr. Graciela Husbands already, so needs EP follow-up. Long-term plan for possible ablation. - replete K+  VT - No ventricular arrhythmias noted  ?Long QT syndrome - continue to monitor    For questions or updates, please contact [facility] Please consult [email or web address] for contact info under        Signed, Cadence David Stall, PA-C  11/29/2020, 9:16 AM    Personally seen and examined. Agree with above.   On for TEE cardioversion tomorrow for her atrial flutter.  She is on Eliquis now.  She is tearful, was supposed to be getting a baby shower in [place].  Given  overall complexity with ICD, mexiletine, possible long QT syndrome in the past, we will go ahead and have EP see her.  Question medication strategy, future ablation strategy given this second episode, etc.  Donato Schultz, MD

## 2020-11-29 NOTE — Discharge Instructions (Signed)

## 2020-11-29 NOTE — ED Provider Notes (Addendum)
[facility] EMERGENCY DEPARTMENT Provider Note   CSN: [id number] Arrival date & time: 11/28/20  2040     History Chief Complaint  Patient presents with  . Atrial Fibrillation    [name] is a 55 y.o. female.  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  Patient reports that she began to feel palpitations when driving up from [place].  She generally has felt winded and fatigued.  This feels similar to when she has been in atrial fib before.  She denies any chest pain.  She is currently on metoprolol and mexiletine.  Denies any changes in diet or significant stressors.  She is not currently on anticoagulation.  She has transferred her care to [place] as she has moved from [place] but is back visiting her daughter.  Past Medical History:  Diagnosis Date  . AICD (automatic cardioverter/defibrillator) present   . Atrial flutter (HCC)   . IUD (intrauterine device) in place 2016  . Long Q-T syndrome   . Myocardial infarction (HCC)    due to long QT  . Presence of permanent cardiac pacemaker   . Symptomatic bradycardia   . Ventricular fibrillation [facility])     Patient Active Problem List   Diagnosis Date Noted  . Ventricular tachyarrhythmia (HCC) 11/04/2020  . Ventricular fibrillation (HCC)   . Symptomatic bradycardia   . Presence of permanent cardiac pacemaker   . Myocardial infarction (HCC)   . Long Q-T syndrome   . AICD (automatic cardioverter/defibrillator) present   . IUD (intrauterine device) in place 03/01/2015  . Ovarian cyst, left 01/19/2015  . Atrial flutter with rapid ventricular response (HCC) 09/07/2014  . Atrial fibrillation (HCC) 12/27/2013  . Hydrosalpinx 09/16/2011  . Automatic implantable cardioverter-defibrillator in situ 08/23/2009  . HYPOGLYCEMIA, UNSPECIFIED 03/02/2009  . HYPOPOTASSEMIA 03/02/2009  . Long QT  syndrome 01/18/2009  . Cardiac arrest (HCC) 01/18/2009    Past Surgical History:  Procedure Laterality Date  . ADENOIDECTOMY    . BILATERAL SALPINGECTOMY Bilateral 02/13/2015   Procedure: BILATERAL SALPINGECTOMY;  Surgeon: Ok Edwards, MD;  Location: [facility];  Service: Gynecology;  Laterality: Bilateral;  . CESAREAN SECTION  1994  . DILATION AND CURETTAGE OF UTERUS  1999  . IMPLANTABLE CARDIOVERTER DEFIBRILLATOR (ICD) GENERATOR CHANGE N/A 11/18/2013   Procedure: ICD GENERATOR CHANGE;  Surgeon: Marinus Maw, MD;  Location: [facility] CATH LAB;  Service: Cardiovascular;  Laterality: N/A;  . IMPLANTABLE CARDIOVERTER DEFIBRILLATOR IMPLANT  2009; 11-18-13   MDT single chamber ICD implanted by Dr Ladona Ridgel 2009; upgrade to dual chamber MDT ICD 11/2013 by Dr Ladona Ridgel  . INTRAUTERINE DEVICE INSERTION  February 13, 2011   PARAGUARD   . LAPAROSCOPIC BILATERAL SALPINGECTOMY Bilateral 02/13/2015   Procedure: LAPAROSCOPIC BILATERAL SALPINGECTOMY;  Surgeon: Ok Edwards, MD;  Location: [facility];  Service: Gynecology;  Laterality: Bilateral;  . OOPHORECTOMY Right 02/13/2015   Procedure: OOPHORECTOMY;  Surgeon: Ok Edwards, MD;  Location: [facility];  Service: Gynecology;  Laterality: Right;  . TYMPANOSTOMY TUBE PLACEMENT    . wisdom teeeth        OB History    Gravida  5   Para  4   Term      Preterm      AB  1   Living  4     SAB  1   IAB      Ectopic  Multiple      Live Births              Family History  Problem Relation Age of Onset  . Hypertension Father   . Heart disease Father        PACEMAKER INSERTED  . Diabetes Maternal Grandmother   . Cancer Maternal Grandmother        COLON CANCER    Social History   Tobacco Use  . Smoking status: Never Smoker  . Smokeless tobacco: Never Used  Vaping Use  . Vaping Use: Never used  Substance Use Topics  . Alcohol use: Yes    Comment: occasionally  . Drug use: No    Home Medications Prior to Admission medications    Medication Sig Start Date End Date Taking? Authorizing Provider  acetaminophen (TYLENOL) 500 MG tablet Take 1,000 mg by mouth every 6 (six) hours as needed for mild pain or headache.    [provider]  levonorgestrel (MIRENA, 52 MG,) 20 MCG/24HR IUD 1 Intra Uterine Device by Intrauterine route.    [provider]  metoprolol succinate (TOPROL-XL) 50 MG 24 hr tablet TAKE 1/2 TABLET BY MOUTH EVERY MORNING AND TAKE 1 TABLET BY MOUTH EVERY NIGHT AT BEDTIME WITH OR IMMEDIATELY FOLLOWING A MEAL Patient taking differently: Take 25-50 mg by mouth See admin instructions. 25 mg in the morning and 50 mg hs 03/23/20   Marinus Maw, MD  mexiletine (MEXITIL) 150 MG capsule Take 1 capsule (150 mg total) by mouth 2 (two) times daily. Please make overdue appt with Dr. Ladona Ridgel before anymore refills. 1st attempt 03/29/20   Marinus Maw, MD  OVER THE COUNTER MEDICATION Place 1 drop into both eyes daily as needed (dry/ irritated  eyes).    [provider]  potassium chloride SA (K-DUR) 20 MEQ tablet TAKE 2 TABLETS BY MOUTH TWICE DAILY Patient taking differently: Take 40 mEq by mouth 2 (two) times daily. 03/22/19   Marinus Maw, MD    Allergies    Patient has no known allergies.  Review of Systems   Review of Systems  Constitutional: Positive for fatigue. Negative for fever.  Respiratory: Negative for shortness of breath.   Cardiovascular: Positive for palpitations. Negative for chest pain and leg swelling.  Gastrointestinal: Negative for abdominal pain, nausea and vomiting.  All other systems reviewed and are negative.   Physical Exam Updated Vital Signs BP 111/84   Pulse (!) 115   Temp 98.5 F (36.9 C) (Oral)   Resp 20   Ht 1.626 m (5\' 4" )   Wt 70.3 kg   SpO2 99%   BMI 26.61 kg/m   Physical Exam Vitals and nursing note reviewed.  Constitutional:      Appearance: She is well-developed. She is not ill-appearing.  HENT:     Head: Normocephalic and atraumatic.      Mouth/Throat:     Mouth: Mucous membranes are moist.  Eyes:     Pupils: Pupils are equal, round, and reactive to light.  Cardiovascular:     Rate and Rhythm: Regular rhythm. Tachycardia present.     Comments: Irregularly irregular rhythm Pulmonary:     Effort: Pulmonary effort is normal. No respiratory distress.     Breath sounds: No wheezing.  Abdominal:     Palpations: Abdomen is soft.     Tenderness: There is no abdominal tenderness.  Musculoskeletal:     Cervical back: Neck supple.     Right lower leg: No edema.  Left lower leg: No edema.  Skin:    General: Skin is warm and dry.  Neurological:     Mental Status: She is alert and oriented to person, place, and time.  Psychiatric:     Comments: Tearful     ED Results / Procedures / Treatments   Labs (all labs ordered are listed, but only abnormal results are displayed) Labs Reviewed  BASIC METABOLIC PANEL - Abnormal; Notable for the following components:      Result Value   Potassium 3.4 (*)    Glucose, Bld 118 (*)    All other components within normal limits  CBC - Abnormal; Notable for the following components:   Hemoglobin 15.4 (*)    HCT 46.7 (*)    All other components within normal limits  TROPONIN I (HIGH SENSITIVITY)  TROPONIN I (HIGH SENSITIVITY)    EKG EKG Interpretation  Date/Time:  Wednesday November 28 2020 20:55:48 EDT Ventricular Rate:  124 PR Interval:  164 QRS Duration: 148 QT Interval:  380 QTC Calculation: 545 R Axis:   -82 Text Interpretation: Sinus tachycardia vs flutter Left axis deviation Right bundle branch block Possible Lateral infarct , age undetermined Abnormal ECG Reconfirmed by Ross Marcus ([id number]) on 11/28/2020 11:49:08 PM   Radiology DG Chest 2 View  Result Date: 11/28/2020 CLINICAL DATA:  55 year old female with atrial fibrillation EXAM: CHEST - 2 VIEW COMPARISON:  Chest radiograph dated 11/18/2013 FINDINGS: No focal consolidation, pleural effusion, pneumothorax. The  cardiac silhouette is within limits. Left pectoral AICD device. No acute osseous pathology. IMPRESSION: No active cardiopulmonary disease. Electronically Signed   By: Elgie Collard M.D.   On: 11/28/2020 21:29    Procedures Procedures   Medications Ordered in ED Medications  metoprolol tartrate (LOPRESSOR) tablet 25 mg (has no administration in time range)    ED Course  I have reviewed the triage vital signs and the nursing notes.  Pertinent labs & imaging results that were available during my care of the patient were reviewed by me and considered in my medical decision making (see chart for details).  Clinical Course as of 11/29/20 0009  Thu Nov 29, 2020  0007 Cardiology, Dr. Cherly Beach at the bedside.  We had a collective discussion with the patient regarding her current symptoms and ongoing risk factors.  She was advised that she is not a good candidate for immediate cardioversion and that cardioversion may or may not be successful given duration and recurrence of atrial flutter which seems to be more refractory.  Plan to admit patient for TEE tomorrow and further management. [CH]  0009 Patient currently on metoprolol for rate control.  We will start metoprolol tartrate 25 mg every 6 hours and further defer rate control to cardiology.  Patient is in a rate of 110-115 and clinically stable. [CH]    Clinical Course User Index [CH] Felisa Zechman, Mayer Masker, MD   MDM Rules/Calculators/A&P                          Patient presents with concerns that she is in atrial fib/flutter.  Unknown onset.  She was cardioverted about 3 weeks ago.  She is not currently on any anticoagulants.  She does appear to be in atrial flutter with a rate in the 110s to 115.  She is hemodynamically stable.  See clinical course above.  Pacemaker interrogation indicates that she has been in atrial flutter since Sunday.  This would place patient in a high risk  for cardioversion.  This in combination with the potential that  she would be more refractory to cardioversion makes ED cardioversion without TEE high risk.  Patient is tearful as she would like to have a "quick" plan.  Cardiology and I discussed with her that the best option for her would be admission for TEE and possible cardioversion.  Additional options can be discussed with EP on admission.  She is agreeable to plan.   Magnesium was added to work-up.  Potassium slightly low at 3.4.  This was replaced orally.  Patient started on metoprolol 25 mg every 6 hours per cardiology.  CHA2DS2-VASc Score = 1  The patient's score is based upon: CHF History: No HTN History: No Diabetes History: No Stroke History: No Vascular Disease History: No Age Score: 0 Gender Score: 1      ASSESSMENT AND PLAN: Paroxysmal atrial fib/flutter The patient's CHA2DS2-VASc score is 1, indicating a 0.6% annual risk of stroke.       Signed,  Shon Baton, MD    11/29/2020 12:15 AM   Final Clinical Impression(s) / ED Diagnoses Final diagnoses:  Atrial flutter with rapid ventricular response [facility])    Rx / DC Orders ED Discharge Orders    None       Forest Redwine, Mayer Masker, MD 11/29/20 0630    Shon Baton, MD 11/29/20 0020

## 2020-11-29 NOTE — Consult Note (Signed)
Cardiology Consultation:   Patient ID: [name] MRN: [id number]; DOB: [birth year]  Admit date: 11/28/2020 Date of Consult: 11/29/2020  PCP:  Patient, No Pcp Per (Inactive)   [facility]  Cardiologist:  Lewayne Bunting, MD  [phone]    Patient Profile:   [name] is a 55 y.o. 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  History of Present Illness:   Ms. Belzer was seen by Dr. Graciela Husbands during a recent hospitalization 11/04/20 2/2 ICD shocks that by device interrogation inappropriate 2/2 AFlutter with 1:1 conduction, ATP had no impact Attempts to pace terminate by Dr.Klein were unsuccessful and she was cardioverted, mentioned TE risk is low and short duration, not felt to need a/c. He felt parts of her story  NOT consistent with LQTS as primary diagnosis             - sinus node dysfunction                      - bifasciuclar block                   - small R waves at implant and if LQTS is not the right diagnosis then perhaps mex is not necessary and recommend that she be evaluated by someone with specific EP expertise with channelopathies. She was discharged same day.  She lives in [place] now, and working on getting cardiology care established  She was here visiting her daughter, admitted this morning with c/o palpitations.  Cardiology service admitted, pt expressed interest in ablation with our group given she is here locally regularly despite living in [place] now. She was started on Eliquis towards thoughts of potential towards rhythm control strategy and possible ablation, her Toprol was increased.  She states that she is unsure why she went into atrial flutter.  The previous  event, she was watching the [place] [employer] basketball game and got quite anxious which she feels was her trigger.  She currently has palpitations and mild shortness of breath, but otherwise is in no acute distress.  Planned for TEE/DCCV tomorrow  EP is asked to help  LABS K+ 3.4 BUN/Creat 19/0.93 HS Trop 05/19/15 WBC 6.5 H/H 15/46 Plts 254 TSH 2.158   Device information MDT dual chamber ICD implanted 11/18/2013  Interrogation Battery and lead measurements are good Presenting AFlutter/VS Episode start was 11/25/20 and is ongoing VP <1%   Past Medical History:  Diagnosis Date  . AICD (automatic cardioverter/defibrillator) present   . Atrial flutter (HCC)   . IUD (intrauterine device) in place 2016  . Long Q-T syndrome   . Myocardial infarction (HCC)    due to long QT  . Presence of permanent cardiac pacemaker   . Symptomatic bradycardia   . Ventricular fibrillation [facility])     Past Surgical History:  Procedure Laterality Date  . ADENOIDECTOMY    . BILATERAL SALPINGECTOMY Bilateral 02/13/2015   Procedure: BILATERAL SALPINGECTOMY;  Surgeon: Ok Edwards, MD;  Location: [facility];  Service: Gynecology;  Laterality: Bilateral;  . CESAREAN SECTION  1994  . DILATION AND CURETTAGE OF UTERUS  1999  . IMPLANTABLE CARDIOVERTER DEFIBRILLATOR (ICD) GENERATOR CHANGE N/A 11/18/2013   Procedure:  ICD GENERATOR CHANGE;  Surgeon: Marinus Maw, MD;  Location: [facility] CATH LAB;  Service: Cardiovascular;  Laterality: N/A;  . IMPLANTABLE CARDIOVERTER DEFIBRILLATOR IMPLANT  2009; 11-18-13   MDT single chamber ICD implanted by Dr Ladona Ridgel 2009; upgrade to dual chamber MDT ICD 11/2013 by Dr Ladona Ridgel  . INTRAUTERINE DEVICE INSERTION  February 13, 2011   PARAGUARD   . LAPAROSCOPIC BILATERAL SALPINGECTOMY Bilateral 02/13/2015   Procedure: LAPAROSCOPIC BILATERAL SALPINGECTOMY;  Surgeon: Ok Edwards, MD;  Location: [facility];  Service: Gynecology;  Laterality: Bilateral;  . OOPHORECTOMY Right 02/13/2015    Procedure: OOPHORECTOMY;  Surgeon: Ok Edwards, MD;  Location: [facility];  Service: Gynecology;  Laterality: Right;  . TYMPANOSTOMY TUBE PLACEMENT    . wisdom teeeth        Home Medications:  Prior to Admission medications   Medication Sig Start Date End Date Taking? Authorizing Provider  acetaminophen (TYLENOL) 500 MG tablet Take 1,000 mg by mouth every 6 (six) hours as needed for mild pain or headache.   Yes [provider]  ibuprofen (ADVIL) 200 MG tablet Take 400 mg by mouth every 6 (six) hours as needed for headache or moderate pain.   Yes [provider]  metoprolol succinate (TOPROL-XL) 50 MG 24 hr tablet TAKE 1/2 TABLET BY MOUTH EVERY MORNING AND TAKE 1 TABLET BY MOUTH EVERY NIGHT AT BEDTIME WITH OR IMMEDIATELY FOLLOWING A MEAL Patient taking differently: Take 25-50 mg by mouth See admin instructions. 25 mg in the morning and 50 mg hs 03/23/20  Yes Marinus Maw, MD  mexiletine (MEXITIL) 150 MG capsule Take 1 capsule (150 mg total) by mouth 2 (two) times daily. Please make overdue appt with Dr. Ladona Ridgel before anymore refills. 1st attempt 03/29/20  Yes Marinus Maw, MD  OVER THE COUNTER MEDICATION Place 1 drop into both eyes daily as needed (dry/ irritated  eyes).   Yes [provider]  potassium chloride SA (K-DUR) 20 MEQ tablet TAKE 2 TABLETS BY MOUTH TWICE DAILY Patient taking differently: Take 40 mEq by mouth 2 (two) times daily. 03/22/19  Yes Marinus Maw, MD  levonorgestrel (MIRENA, 52 MG,) 20 MCG/24HR IUD 1 Intra Uterine Device by Intrauterine route.    [provider]    Inpatient Medications: Scheduled Meds: . apixaban  5 mg Oral BID  . [START ON 11/30/2020] metoprolol succinate  50 mg Oral BID  . mexiletine  150 mg Oral Q12H   Continuous Infusions:  PRN Meds: acetaminophen  Allergies:   No Known Allergies  Social History:   Social History   Socioeconomic History  . Marital status: Married    Spouse name: Not on file  .  Number of children: Not on file  . Years of education: Not on file  . Highest education level: Not on file  Occupational History  . Not on file  Tobacco Use  . Smoking status: Never Smoker  . Smokeless tobacco: Never Used  Vaping Use  . Vaping Use: Never used  Substance and Sexual Activity  . Alcohol use: Yes    Comment: occasionally  . Drug use: No  . Sexual activity: Yes    Partners: Male    Birth control/protection: I.U.D.    Comment: PARAGARD  Other Topics Concern  . Not on file  Social History Narrative  . Not on file   Social Determinants of Health   Financial Resource Strain: Not on file  Food Insecurity: Not on file  Transportation Needs: Not on file  Physical Activity: Not on file  Stress: Not on file  Social Connections: Not on file  Intimate Partner Violence: Not on file    Family History:   Family History  Problem Relation Age of Onset  . Hypertension Father   . Heart disease Father        PACEMAKER INSERTED  . Diabetes Maternal Grandmother   . Cancer Maternal Grandmother        COLON CANCER     ROS:  Please see the history of present illness.  All other ROS reviewed and negative.     Physical Exam/Data:   Vitals:   11/29/20 0945 11/29/20 1100 11/29/20 1200 11/29/20 1600  BP:  101/82 91/76 91/62   Pulse: (!) 114 (!) 114 (!) 116 (!) 116  Resp: 12 16 20 16   Temp:   98.1 F (36.7 C)   TempSrc:   Oral   SpO2: 98% 98% 97% 98%  Weight:      Height:        Intake/Output Summary (Last 24 hours) at 11/29/2020 1628 Last data filed at 11/29/2020 1546 Gross per 24 hour  Intake 240 ml  Output --  Net 240 ml   Last 3 Weights 11/28/2020 11/04/2020 02/07/2019  Weight (lbs) 155 lb 154 lb 140 lb  Weight (kg) 70.308 kg 69.854 kg 63.504 kg     Body mass index is 26.61 kg/m.  General:  Well nourished, well developed, in no acute distress HEENT: normal Lymph: no adenopathy Neck: no JVD Endocrine:  No thryomegaly Vascular: No carotid bruits; FA pulses 2+  bilaterally without bruits  Cardiac: Tachycardic, regular; no murmur  Lungs:  clear to auscultation bilaterally, no wheezing, rhonchi or rales  Abd: soft, nontender, no hepatomegaly  Ext: no edema Musculoskeletal:  No deformities, BUE and BLE strength normal and equal Skin: warm and dry  Neuro:  CNs 2-12 intact, no focal abnormalities noted Psych:  Normal affect   EKG:  The EKG was personally reviewed and demonstrates:    AFlutter 115bpm, RBBB, LAD  Old AFlutter 109bpm, RBBB, LAD 01/08/2019 SB 50bpm, 1st dgree AVblock , RBBB, LAD  Telemetry:  Telemetry was personally reviewed and demonstrates: Atrial flutter  Relevant CV Studies:   Echo ordered and pending  10/30/2009: TTE Study Conclusions  - Left ventricle: The cavity size was normal. Wall thickness was   normal. Systolic function was low normal. The estimated ejection   fraction was in the range of 50% to 55%. Left ventricular   diastolic function parameters were normal.  - Aortic valve: There was no stenosis.  - Mitral valve: Trivial regurgitation.  - Left atrium: The atrium was mildly dilated.  - Right ventricle: The cavity size was normal. Pacer wire or   catheter noted in right ventricle. Systolic function was normal.  - Right atrium: The atrium was mildly dilated.  - Pulmonary arteries: PA peak pressure: 68mm Hg (S).  - Inferior vena cava: The vessel was normal in size; the   respirophasic diameter changes were in the normal range (= 50%);   findings are consistent with normal central venous pressure.  Impressions:   - Normal LV size with low normal systolic function, EF estimated   50-55%. No regional wall motion abnormalities. Surprisingly,   diastolic parameters appeared normal. ICD in RV. Normal RV size   and systolic function. No pulmonary hypertension. Mild biatrial   enlargement.    Laboratory Data:  High Sensitivity Troponin:   Recent Labs  Lab 11/04/20 0400  11/28/20 2107  11/28/20 2322  TROPONINIHS 10 15 16      Chemistry Recent Labs  Lab 11/28/20 2107  NA 139  K 3.4*  CL 107  CO2 25  GLUCOSE 118*  BUN 19  CREATININE 0.93  CALCIUM 9.4  GFRNONAA >60  ANIONGAP 7    No results for input(s): PROT, ALBUMIN, AST, ALT, ALKPHOS, BILITOT in the last 168 hours. Hematology Recent Labs  Lab 11/28/20 2107  WBC 6.5  RBC 4.80  HGB 15.4*  HCT 46.7*  MCV 97.3  MCH 32.1  MCHC 33.0  RDW 12.6  PLT 254   BNPNo results for input(s): BNP, PROBNP in the last 168 hours.  DDimer No results for input(s): DDIMER in the last 168 hours.   Radiology/Studies:  DG Chest 2 View  Result Date: 11/28/2020 CLINICAL DATA:  55 year old female with atrial fibrillation EXAM: CHEST - 2 VIEW COMPARISON:  Chest radiograph dated 11/18/2013 FINDINGS: No focal consolidation, pleural effusion, pneumothorax. The cardiac silhouette is within limits. Left pectoral AICD device. No acute osseous pathology. IMPRESSION: No active cardiopulmonary disease. Electronically Signed   By: 11/20/2013 M.D.   On: 11/28/2020 21:29     Assessment and Plan:   1. AFlutter, looks atypical     CHA2DS2Vasc one for female  Agree with Eliquis and plans for TEE/DCCV No change to her mexiletine for now I have arranged early follow up with Dr. 11/30/2020, difficult to plan for though since she doesn't live here.  2. Hx of VF arrest 3. ? Brugada, long QT     Has ICD for secondary prevention    Risk Assessment/Risk Scores:   For questions or updates, please contact [facility] Please consult [email or web address] for contact info under    Signed, Ladona Ridgel, PA-C  11/29/2020 4:28 PM  I have seen and examined this patient with 12/01/2020.  Agree with above, note added to reflect my findings.  On exam, tachycardic, regular.  She presented to the emergency room with palpitations, found to be in atrial flutter.  Her atrial flutter is recurrent from 3 weeks ago.  She had a  cardioversion in the emergency room at that time for a one-to-one atrial flutter.  She feels palpitations and somewhat short of breath.  She has plans for TEE and cardioversion tomorrow.  We Usman Millett plan for follow-up in EP clinic next week.  Jaydrian Corpening M. Jeliyah Middlebrooks MD 11/29/2020 5:42 PM

## 2020-11-30 ENCOUNTER — Other Ambulatory Visit (HOSPITAL_COMMUNITY): Payer: Self-pay

## 2020-11-30 ENCOUNTER — Inpatient Hospital Stay (HOSPITAL_COMMUNITY): Payer: Managed Care, Other (non HMO)

## 2020-11-30 ENCOUNTER — Encounter (HOSPITAL_COMMUNITY): Payer: Self-pay | Admitting: Cardiology

## 2020-11-30 ENCOUNTER — Inpatient Hospital Stay (HOSPITAL_COMMUNITY): Payer: Managed Care, Other (non HMO) | Admitting: Certified Registered"

## 2020-11-30 ENCOUNTER — Encounter (HOSPITAL_COMMUNITY)
Admission: EM | Disposition: A | Discharge status: Home / Self Care | Payer: Self-pay | Source: Home / Self Care | Attending: Student in an Organized Health Care Education/Training Program

## 2020-11-30 DIAGNOSIS — I4892 Unspecified atrial flutter: Secondary | ICD-10-CM | POA: Diagnosis not present

## 2020-11-30 DIAGNOSIS — I484 Atypical atrial flutter: Secondary | ICD-10-CM | POA: Diagnosis not present

## 2020-11-30 DIAGNOSIS — I495 Sick sinus syndrome: Secondary | ICD-10-CM

## 2020-11-30 HISTORY — PX: TEE WITHOUT CARDIOVERSION: SHX5443

## 2020-11-30 HISTORY — PX: CARDIOVERSION: SHX1299

## 2020-11-30 LAB — CBC
HCT: 48.2 % — ABNORMAL HIGH (ref 36.0–46.0)
Hemoglobin: 15.7 g/dL — ABNORMAL HIGH (ref 12.0–15.0)
MCH: 32 pg (ref 26.0–34.0)
MCHC: 32.6 g/dL (ref 30.0–36.0)
MCV: 98.2 fL (ref 80.0–100.0)
Platelets: 227 10*3/uL (ref 150–400)
RBC: 4.91 MIL/uL (ref 3.87–5.11)
RDW: 12.6 % (ref 11.5–15.5)
WBC: 5.6 10*3/uL (ref 4.0–10.5)
nRBC: 0 % (ref 0.0–0.2)

## 2020-11-30 LAB — BASIC METABOLIC PANEL
Anion gap: 7 (ref 5–15)
BUN: 13 mg/dL (ref 6–20)
CO2: 24 mmol/L (ref 22–32)
Calcium: 9.2 mg/dL (ref 8.9–10.3)
Chloride: 109 mmol/L (ref 98–111)
Creatinine, Ser: 0.86 mg/dL (ref 0.44–1.00)
GFR, Estimated: >60 mL/min (ref >60)
Glucose, Bld: 93 mg/dL (ref 70–99)
Potassium: 3.9 mmol/L (ref 3.5–5.1)
Sodium: 140 mmol/L (ref 135–145)

## 2020-11-30 SURGERY — ECHOCARDIOGRAM, TRANSESOPHAGEAL
Anesthesia: Monitor Anesthesia Care

## 2020-11-30 MED ORDER — PROPOFOL 500 MG/50ML IV EMUL
INTRAVENOUS | Status: DC | PRN
  Administered 2020-11-30: 150 ug/kg/min via INTRAVENOUS

## 2020-11-30 MED ORDER — METOPROLOL SUCCINATE ER 50 MG PO TB24
50.0000 mg | ORAL_TABLET | Freq: Two times a day (BID) | ORAL | 3 refills | Status: DC
  Filled 2020-11-30: qty 60, 30d supply, fill #0

## 2020-11-30 MED ORDER — APIXABAN 5 MG PO TABS
5.0000 mg | ORAL_TABLET | Freq: Two times a day (BID) | ORAL | 3 refills | Status: DC
  Filled 2020-11-30: qty 60, 30d supply, fill #0

## 2020-11-30 MED ORDER — PROPOFOL 10 MG/ML IV BOLUS
INTRAVENOUS | Status: DC | PRN
  Administered 2020-11-30: 40 mg via INTRAVENOUS
  Administered 2020-11-30: 20 mg via INTRAVENOUS

## 2020-11-30 NOTE — Anesthesia Procedure Notes (Signed)
Procedure Name: MAC Date/Time: 11/30/2020 1:57 PM Performed by: Amadeo Garnet, CRNA Pre-anesthesia Checklist: Patient identified, Emergency Drugs available, Suction available, Patient being monitored and Timeout performed Patient Re-evaluated:Patient Re-evaluated prior to induction Oxygen Delivery Method: Nasal cannula Preoxygenation: Pre-oxygenation with 100% oxygen Induction Type: IV induction Placement Confirmation: positive ETCO2 Dental Injury: Teeth and Oropharynx as per pre-operative assessment

## 2020-11-30 NOTE — Transfer of Care (Signed)
Immediate Anesthesia Transfer of Care Note  Patient: [name]  Procedure(s) Performed: TRANSESOPHAGEAL ECHOCARDIOGRAM (TEE) (N/A ) CARDIOVERSION (N/A )  Patient Location: Endoscopy Unit  Anesthesia Type:MAC  Level of Consciousness: drowsy  Airway & Oxygen Therapy: Patient Spontanous Breathing and Patient connected to nasal cannula oxygen  Post-op Assessment: Report given to RN and Post -op Vital signs reviewed and stable  Post vital signs: Reviewed and stable  Last Vitals:  Vitals Value Taken Time  BP 91/63 11/30/20 1426  Temp 36.4 C 11/30/20 1426  Pulse 60 11/30/20 1432  Resp 19 11/30/20 1432  SpO2 99 % 11/30/20 1432  Vitals shown include unvalidated device data.  Last Pain:  Vitals:   11/30/20 1426  TempSrc: Oral  PainSc: 0-No pain         Complications: No complications documented.

## 2020-11-30 NOTE — Discharge Summary (Addendum)
Discharge Summary    Patient ID: [name] MRN: [id number]; DOB: 06-09-1966  Admit date: 11/28/2020 Discharge date: 11/30/2020  PCP:  Patient, No Pcp Per (Inactive)   [facility]  Cardiologist:  Lewayne Bunting, MD  Advanced Practice Provider:  No care team member to display Electrophysiologist:  None [id number]    Discharge Diagnoses    Active Problems:   Presence of permanent cardiac pacemaker   Long Q-T syndrome   Ventricular tachyarrhythmia (HCC)   Atrial flutter (HCC)   Sinus node dysfunction (HCC)   Diagnostic Studies/Procedures    TEE/DCCV   FINDINGS:  No LAA thrombus  CARDIOVERSION:     Indications:  Symptomatic Atrial Fibrillation and Atrial Flutter  Procedure Details:  Once the TEE was complete, the patient had the defibrillator pads placed in the anterior and posterior position. Once an appropriate level of sedation was confirmed, the patient was cardioverted x 1 with 100J of biphasic synchronized energy.  The patient converted to A-paced V-sensed rhythm with rate 80bpm, confirmed on device interrogation.  There were no apparent complications.  The patient had normal neuro status and respiratory status post procedure with vitals stable as recorded elsewhere.  Adequate airway was maintained throughout and vital signs monitored per protocol.  _____________   History of Present Illness     [name] is a 55 y.o. 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   Patient seen by Dr. Graciela Husbands during recent hospitalization 11/04/20 due to ICD shocks that by device interrogation inappropriate 2/2 aflutter with 1:1 conduction, ATP had no impact. Cardioversion was unsuccessful. Not felt to be a/c.   Patient lives in [place] but is visiting her daughter in [place]. She plans to continue follow-up with EP here in  [place].   Patient presented to the ER 2/27 for palpitations and generalized fatigue. She noticed symptoms 3-4 days prior when she was driving. She denied any chest pain, shortness of breath, orthopnea, or PND. She tried to call her cardiologist in [place] but was unable to reach anyone. She did a remote transmission of ICD data. Because she couldn't reach anyone she presented to the ER and device interrogation showed recurrent Aflutter that started Sunday evening.   Hospital Course     Consultants: EP  The patient was admitted to cardiology service and started on Eliquis 5mg  BID. Toprol was increased to 50mg  daily. BPS were soft. She remained in aflutter with elevated rates. She was scheduled for TEE/DCCV the next day. EP asked to see for possible antiarrythmic recommendations. She was on mexiletine for VT. EP recommended to continue mexiletine and plan for cardioversion, with close EP follow-up for ablation consideration. The next day the patient underwent successful TEE/DCCV to SR. After the cardioversion the patient said she's leaving AMA if she wasn't discharged, so she could attend her daughter's baby shower the following day. Plan to continue Eliquis 5mg  BID, Mexiletine 150mg  daily, and Toprol 50mg  BID at discharge.   Patient was evaluated by Dr. 11/30/20 and felt to be stable for discharge. She has EP follow-up scheduled for next week.   _____________  Discharge Vitals Blood pressure 104/73, pulse (!) 56, temperature 97.6 F (36.4 C), temperature source Oral, resp. rate 14, height 5\' 4"  (1.626 m), weight 67.3 kg, SpO2 100 %.  Filed Weights   11/28/20 2106 11/30/20 0412  Weight: 70.3 kg 67.3 kg  Labs & Radiologic Studies    CBC Recent Labs    11/28/20 2107 11/30/20 0946  WBC 6.5 5.6  HGB 15.4* 15.7*  HCT 46.7* 48.2*  MCV 97.3 98.2  PLT 254 227   Basic Metabolic Panel Recent Labs    40/98/11 2107 11/30/20 0946  NA 139 140  K 3.4* 3.9  CL 107 109  CO2 25 24   GLUCOSE 118* 93  BUN 19 13  CREATININE 0.93 0.86  CALCIUM 9.4 9.2   Liver Function Tests No results for input(s): AST, ALT, ALKPHOS, BILITOT, PROT, ALBUMIN in the last 72 hours. No results for input(s): LIPASE, AMYLASE in the last 72 hours. High Sensitivity Troponin:   Recent Labs  Lab 11/04/20 0400 11/28/20 2107 11/28/20 2322  TROPONINIHS 10 15 16     BNP Invalid input(s): POCBNP D-Dimer No results for input(s): DDIMER in the last 72 hours. Hemoglobin A1C No results for input(s): HGBA1C in the last 72 hours. Fasting Lipid Panel No results for input(s): CHOL, HDL, LDLCALC, TRIG, CHOLHDL, LDLDIRECT in the last 72 hours. Thyroid Function Tests No results for input(s): TSH, T4TOTAL, T3FREE, THYROIDAB in the last 72 hours.  Invalid input(s): FREET3 _____________  DG Chest 2 View  Result Date: 11/28/2020 CLINICAL DATA:  55 year old female with atrial fibrillation EXAM: CHEST - 2 VIEW COMPARISON:  Chest radiograph dated 11/18/2013 FINDINGS: No focal consolidation, pleural effusion, pneumothorax. The cardiac silhouette is within limits. Left pectoral AICD device. No acute osseous pathology. IMPRESSION: No active cardiopulmonary disease. Electronically Signed   By: 11/20/2013 M.D.   On: 11/28/2020 21:29   Disposition   Pt is being discharged home today in good condition.  Follow-up Plans & Appointments     Follow-up Information    11/30/2020, MD Follow up.   Specialty: Cardiology Why: 12/05/20 @ 8:45AM Contact information: [address] [place] [phone]              Discharge Instructions    Diet - low sodium heart healthy   Complete by: As directed    Increase activity slowly   Complete by: As directed       Discharge Medications   Allergies as of 11/30/2020   No Known Allergies     Medication List    STOP taking these medications   ibuprofen 200 MG tablet Commonly known as: ADVIL     TAKE these  medications   acetaminophen 500 MG tablet Commonly known as: TYLENOL Take 1,000 mg by mouth every 6 (six) hours as needed for mild pain or headache.   apixaban 5 MG Tabs tablet Commonly known as: ELIQUIS Take 1 tablet (5 mg total) by mouth 2 (two) times daily.   metoprolol succinate 50 MG 24 hr tablet Commonly known as: TOPROL-XL Take 1 tablet (50 mg total) by mouth 2 (two) times daily. Take with or immediately following a meal. What changed:   how much to take  how to take this  when to take this  additional instructions   mexiletine 150 MG capsule Commonly known as: MEXITIL Take 1 capsule (150 mg total) by mouth 2 (two) times daily. Please make overdue appt with Dr. 12/02/2020 before anymore refills. 1st attempt   Mirena (52 MG) 20 MCG/DAY Iud Generic drug: levonorgestrel 1 Intra Uterine Device by Intrauterine route.   OVER THE COUNTER MEDICATION Place 1 drop into both eyes daily as needed (dry/ irritated  eyes).   potassium chloride SA 20 MEQ tablet Commonly  known as: KLOR-CON TAKE 2 TABLETS BY MOUTH TWICE DAILY          Outstanding Labs/Studies   N/A  Duration of Discharge Encounter   Greater than 30 minutes including physician time.  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  During the TEE cardioversion it was noteworthy that she transition to atrial fibrillation and her ventricular rates went up to approximately the 200 range.  She was successfully cardioverted.  Feels well currently, ambulating well.  Eager to go home to [place].   She is currently on Eliquis 5 mg twice a day.  Toprol was also increased to 50 mg twice daily.  Continuing with  mexiletine.  Okay with discharge.  Close follow-up.  She states that she is contemplating ablation therapy at [facility].  Donato Schultz, MD

## 2020-11-30 NOTE — CV Procedure (Signed)
   TRANSESOPHAGEAL ECHOCARDIOGRAM GUIDED DIRECT CURRENT CARDIOVERSION  NAME:  [name]   MRN: [id number] DOB:  1965-11-16   ADMIT DATE: 11/28/2020  INDICATIONS: Symptomatic atrial flutter  PROCEDURE:   Informed consent was obtained prior to the procedure. The risks, benefits and alternatives for the procedure were discussed and the patient comprehended these risks.  Risks include, but are not limited to, cough, sore throat, vomiting, nausea, somnolence, esophageal and stomach trauma or perforation, bleeding, low blood pressure, aspiration, pneumonia, infection, trauma to the teeth and death.    After a procedural time-out, the oropharynx was anesthetized and the patient was sedated by the anesthesia service. The transesophageal probe was inserted in the esophagus and stomach without difficulty and multiple views were obtained. Anesthesia was monitored by Dr. Noreene Larsson and Baker Pierini, CRNA.   COMPLICATIONS:    Complications: No complications Patient tolerated procedure well.  FINDINGS:  No LAA thrombus  CARDIOVERSION:     Indications:  Symptomatic Atrial Fibrillation and Atrial Flutter  Procedure Details:  Once the TEE was complete, the patient had the defibrillator pads placed in the anterior and posterior position. Once an appropriate level of sedation was confirmed, the patient was cardioverted x 1 with 100J of biphasic synchronized energy.  The patient converted to A-paced V-sensed rhythm with rate 80bpm, confirmed on device interrogation.  There were no apparent complications.  The patient had normal neuro status and respiratory status post procedure with vitals stable as recorded elsewhere.  Adequate airway was maintained throughout and vital signs monitored per protocol.  Epifanio Lesches MD [facility]  [address], [address] [place], [place] [address] [phone]   2:23 PM

## 2020-11-30 NOTE — Progress Notes (Addendum)
D/C instructions given and reviewed. Tele and IV removed, tolerated well. Eliquis copay card given. Awaiting TOC meds.

## 2020-11-30 NOTE — Progress Notes (Signed)
  Echocardiogram Echocardiogram Transesophageal with color, doppler, and 3D has been performed. Additional 2D images added at the end of the exam.  Jarome Matin 11/30/2020, 2:39 PM

## 2020-11-30 NOTE — H&P (View-Only) (Signed)
Progress Note  Patient Name: [name] Date of Encounter: 11/30/2020  [facility] Cardiologist: Lewayne Bunting, MD   Subjective   Plan for TEE/DCCV today. Overall patient is feelig much better. Still in aflutter with rates around 120bpm. She reports palpitations. Will order AM labs.  Inpatient Medications    Scheduled Meds: . apixaban  5 mg Oral BID  . metoprolol succinate  50 mg Oral BID  . mexiletine  150 mg Oral Q12H   Continuous Infusions: . sodium chloride 20 mL/hr at 11/30/20 0350   PRN Meds: acetaminophen   Vital Signs    Vitals:   11/29/20 2006 11/29/20 2352 11/30/20 0412 11/30/20 0828  BP: 102/83 105/84 103/72 97/75  Pulse: (!) 121 (!) 108 (!) 115 (!) 119  Resp: 18 18 16    Temp: 98.7 F (37.1 C) 97.8 F (36.6 C) 97.8 F (36.6 C) (!) 97.4 F (36.3 C)  TempSrc: Oral Oral Oral Oral  SpO2: 99% 97% 99% 96%  Weight:   67.3 kg   Height:        Intake/Output Summary (Last 24 hours) at 11/30/2020 0851 Last data filed at 11/30/2020 0350 Gross per 24 hour  Intake 535.09 ml  Output --  Net 535.09 ml   Last 3 Weights 11/30/2020 11/28/2020 11/04/2020  Weight (lbs) 148 lb 6.4 oz 155 lb 154 lb  Weight (kg) 67.314 kg 70.308 kg 69.854 kg      Telemetry    Afib, HR 120bpm - Personally Reviewed  ECG     No new- Personally Reviewed  Physical Exam   GEN: No acute distress.   Neck: No JVD Cardiac: Reg Irreg, tachy, no murmurs, rubs, or gallops.  Respiratory: Clear to auscultation bilaterally. GI: Soft, nontender, non-distended  MS: No edema; No deformity. Neuro:  Nonfocal  Psych: Normal affect   Labs    High Sensitivity Troponin:   Recent Labs  Lab 11/04/20 0400 11/28/20 2107 11/28/20 2322  TROPONINIHS 10 15 16       Chemistry Recent Labs  Lab 11/28/20 2107  NA 139  K 3.4*  CL 107  CO2 25  GLUCOSE 118*  BUN 19  CREATININE 0.93  CALCIUM 9.4  GFRNONAA >60  ANIONGAP 7     Hematology Recent Labs  Lab 11/28/20 2107  WBC 6.5   RBC 4.80  HGB 15.4*  HCT 46.7*  MCV 97.3  MCH 32.1  MCHC 33.0  RDW 12.6  PLT 254    BNPNo results for input(s): BNP, PROBNP in the last 168 hours.   DDimer No results for input(s): DDIMER in the last 168 hours.   Radiology    DG Chest 2 View  Result Date: 11/28/2020 CLINICAL DATA:  55 year old female with atrial fibrillation EXAM: CHEST - 2 VIEW COMPARISON:  Chest radiograph dated 11/18/2013 FINDINGS: No focal consolidation, pleural effusion, pneumothorax. The cardiac silhouette is within limits. Left pectoral AICD device. No acute osseous pathology. IMPRESSION: No active cardiopulmonary disease. Electronically Signed   By: 53 M.D.   On: 11/28/2020 21:29    Cardiac Studies   Echo ordered  Patient Profile     55 y.o. female pmh of prior VF arrest s/p MT ICD, possible long QT syndrome, recurrent Aflutter  Assessment & Plan   Aflutter - second episode in the last month. Unclear trigger for aflutter. First episode not started on [facility] due to [facility] of 1 (female) - ICD interrogation this admission showed aflutter since Sunday night - CHADSVASC 1 for female - Started  on Eliquis 5mg BID for TEE/DCCV today - Metoprolol increased to 50mg BID , has soft pressures so cannot titrate - Still in aflutter with rates around 120, she is is relatively asymptomatic - Echo ordered, has not been performed -  EP saw and agree with plan, patient will see Dr. Taylor next week, plan for possible ablation  VT - No ventricular arrhythmias noted  ?Long QT syndrome - continue to monitor   For questions or updates, please contact [facility] Please consult [email or web address] for contact info under        Signed, Cadence H Furth, PA-C  11/30/2020, 8:51 AM    55-year-old who lives in [place] but frequents [place] who is admitted with atrial flutter atypical rapid ventricular response.  Awaiting TEE cardioversion today.  She is on Eliquis.  She is also getting  metoprolol succinate 50 mg twice a day.  She has been on mexiletine 150 mg twice daily as well for prior VT. she used to see Dr. Gamble in the past.  Has long QT.  Had possible Brugada syndrome.  Has ICD in place.  Appreciate EP consultation yesterday with Dr. Camnitz.  Given her recurrent atrial flutter she will follow-up with EP for close surveillance and obviously if this returns on a more frequent basis, possible ablative therapy.  Marcellas Marchant, MD  

## 2020-11-30 NOTE — Plan of Care (Signed)

## 2020-11-30 NOTE — Interval H&P Note (Signed)
History and Physical Interval Note:  11/30/2020 12:41 PM  [name]  has presented today for surgery, with the diagnosis of AFIB.  The various methods of treatment have been discussed with the patient and family. After consideration of risks, benefits and other options for treatment, the patient has consented to  Procedure(s): TRANSESOPHAGEAL ECHOCARDIOGRAM (TEE) (N/A) CARDIOVERSION (N/A) as a surgical intervention.  The patient's history has been reviewed, patient examined, no change in status, stable for surgery.  I have reviewed the patient's chart and labs.  Questions were answered to the patient's satisfaction.     Little Ishikawa

## 2020-11-30 NOTE — Progress Notes (Addendum)
Progress Note  Patient Name: [name] Date of Encounter: 11/30/2020  [facility] Cardiologist: Lewayne Bunting, MD   Subjective   Plan for TEE/DCCV today. Overall patient is feelig much better. Still in aflutter with rates around 120bpm. She reports palpitations. Will order AM labs.  Inpatient Medications    Scheduled Meds: . apixaban  5 mg Oral BID  . metoprolol succinate  50 mg Oral BID  . mexiletine  150 mg Oral Q12H   Continuous Infusions: . sodium chloride 20 mL/hr at 11/30/20 0350   PRN Meds: acetaminophen   Vital Signs    Vitals:   11/29/20 2006 11/29/20 2352 11/30/20 0412 11/30/20 0828  BP: 102/83 105/84 103/72 97/75  Pulse: (!) 121 (!) 108 (!) 115 (!) 119  Resp: 18 18 16    Temp: 98.7 F (37.1 C) 97.8 F (36.6 C) 97.8 F (36.6 C) (!) 97.4 F (36.3 C)  TempSrc: Oral Oral Oral Oral  SpO2: 99% 97% 99% 96%  Weight:   67.3 kg   Height:        Intake/Output Summary (Last 24 hours) at 11/30/2020 0851 Last data filed at 11/30/2020 0350 Gross per 24 hour  Intake 535.09 ml  Output --  Net 535.09 ml   Last 3 Weights 11/30/2020 11/28/2020 11/04/2020  Weight (lbs) 148 lb 6.4 oz 155 lb 154 lb  Weight (kg) 67.314 kg 70.308 kg 69.854 kg      Telemetry    Afib, HR 120bpm - Personally Reviewed  ECG     No new- Personally Reviewed  Physical Exam   GEN: No acute distress.   Neck: No JVD Cardiac: Reg Irreg, tachy, no murmurs, rubs, or gallops.  Respiratory: Clear to auscultation bilaterally. GI: Soft, nontender, non-distended  MS: No edema; No deformity. Neuro:  Nonfocal  Psych: Normal affect   Labs    High Sensitivity Troponin:   Recent Labs  Lab 11/04/20 0400 11/28/20 2107 11/28/20 2322  TROPONINIHS 10 15 16       Chemistry Recent Labs  Lab 11/28/20 2107  NA 139  K 3.4*  CL 107  CO2 25  GLUCOSE 118*  BUN 19  CREATININE 0.93  CALCIUM 9.4  GFRNONAA >60  ANIONGAP 7     Hematology Recent Labs  Lab 11/28/20 2107  WBC 6.5   RBC 4.80  HGB 15.4*  HCT 46.7*  MCV 97.3  MCH 32.1  MCHC 33.0  RDW 12.6  PLT 254    BNPNo results for input(s): BNP, PROBNP in the last 168 hours.   DDimer No results for input(s): DDIMER in the last 168 hours.   Radiology    DG Chest 2 View  Result Date: 11/28/2020 CLINICAL DATA:  55 year old female with atrial fibrillation EXAM: CHEST - 2 VIEW COMPARISON:  Chest radiograph dated 11/18/2013 FINDINGS: No focal consolidation, pleural effusion, pneumothorax. The cardiac silhouette is within limits. Left pectoral AICD device. No acute osseous pathology. IMPRESSION: No active cardiopulmonary disease. Electronically Signed   By: 53 M.D.   On: 11/28/2020 21:29    Cardiac Studies   Echo ordered  Patient Profile     55 y.o. female pmh of prior VF arrest s/p MT ICD, possible long QT syndrome, recurrent Aflutter  Assessment & Plan   Aflutter - second episode in the last month. Unclear trigger for aflutter. First episode not started on [facility] due to [facility] of 1 (female) - ICD interrogation this admission showed aflutter since Sunday night - CHADSVASC 1 for female - Started  on Eliquis 5mg  BID for TEE/DCCV today - Metoprolol increased to 50mg  BID , has soft pressures so cannot titrate - Still in aflutter with rates around 120, she is is relatively asymptomatic - Echo ordered, has not been performed -  EP saw and agree with plan, patient will see Dr. next week, plan for possible ablation  VT - No ventricular arrhythmias noted  ?Long QT syndrome - continue to monitor   For questions or updates, please contact [facility] Please consult [email or web address] for contact info under        Signed, Cadence , PA-C  11/30/2020, 8:34 AM    55 year old who lives in [place] but frequents [facility] who is admitted with atrial flutter atypical rapid ventricular response.  Awaiting TEE cardioversion today.  She is on Eliquis.  She is also getting  metoprolol succinate 50 mg twice a day.  She has been on mexiletine 150 mg twice daily as well for prior VT. she used to see Dr. [place] in the past.  Has long QT.  Had possible Brugada syndrome.  Has ICD in place.  Appreciate EP consultation yesterday with Dr. [facility].  Given her recurrent atrial flutter she will follow-up with EP for close surveillance and obviously if this returns on a more frequent basis, possible ablative therapy.  Tracy Lincoln, MD

## 2020-11-30 NOTE — Anesthesia Preprocedure Evaluation (Signed)
Anesthesia Evaluation  Patient identified by MRN, date of birth, ID band Patient awake    Reviewed: Allergy & Precautions, NPO status , Patient's Chart, lab work & pertinent test results  Airway Mallampati: II  TM Distance: >3 FB Neck ROM: Full    Dental  (+) Teeth Intact   Pulmonary    breath sounds clear to auscultation       Cardiovascular  Rhythm:Regular Rate:Tachycardia     Neuro/Psych    GI/Hepatic   Endo/Other    Renal/GU      Musculoskeletal   Abdominal   Peds  Hematology   Anesthesia Other Findings   Reproductive/Obstetrics                             Anesthesia Physical Anesthesia Plan  ASA: III  Anesthesia Plan: MAC   Post-op Pain Management:    Induction: Intravenous  PONV Risk Score and Plan: Propofol infusion  Airway Management Planned: Natural Airway and Nasal Cannula  Additional Equipment:   Intra-op Plan:   Post-operative Plan:   Informed Consent: I have reviewed the patients History and Physical, chart, labs and discussed the procedure including the risks, benefits and alternatives for the proposed anesthesia with the patient or authorized representative who has indicated his/her understanding and acceptance.       Plan Discussed with: CRNA and Anesthesiologist  Anesthesia Plan Comments:         Anesthesia Quick Evaluation

## 2020-12-01 NOTE — Anesthesia Postprocedure Evaluation (Signed)
Anesthesia Post Note  Patient: EDRIS SCHNECK  Procedure(s) Performed: TRANSESOPHAGEAL ECHOCARDIOGRAM (TEE) (N/A ) CARDIOVERSION (N/A )     Patient location during evaluation: Endoscopy Anesthesia Type: MAC Level of consciousness: awake and alert Pain management: pain level controlled Vital Signs Assessment: post-procedure vital signs reviewed and stable Respiratory status: spontaneous breathing, nonlabored ventilation, respiratory function stable and patient connected to nasal cannula oxygen Cardiovascular status: stable and blood pressure returned to baseline Postop Assessment: no apparent nausea or vomiting Anesthetic complications: no   No complications documented.  Last Vitals:  Vitals:   11/30/20 1435 11/30/20 1444  BP: 98/75 104/73  Pulse: (!) 58 (!) 56  Resp: 17 14  Temp:    SpO2: 99% 100%    Last Pain:  Vitals:   11/30/20 1444  TempSrc:   PainSc: 0-No pain                 Jazzlynn Rawe COKER

## 2020-12-03 ENCOUNTER — Encounter (HOSPITAL_COMMUNITY): Payer: Self-pay | Admitting: Cardiology

## 2020-12-05 ENCOUNTER — Other Ambulatory Visit: Payer: Self-pay

## 2020-12-05 ENCOUNTER — Telehealth: Payer: Self-pay | Admitting: Emergency Medicine

## 2020-12-05 ENCOUNTER — Encounter: Payer: Self-pay | Admitting: Internal Medicine

## 2020-12-05 ENCOUNTER — Ambulatory Visit: Payer: Managed Care, Other (non HMO) | Admitting: Internal Medicine

## 2020-12-05 VITALS — BP 102/62 | HR 50 | Ht 64.0 in | Wt 154.6 lb

## 2020-12-05 DIAGNOSIS — I484 Atypical atrial flutter: Secondary | ICD-10-CM

## 2020-12-05 DIAGNOSIS — I4901 Ventricular fibrillation: Secondary | ICD-10-CM

## 2020-12-05 DIAGNOSIS — Z95810 Presence of automatic (implantable) cardiac defibrillator: Secondary | ICD-10-CM

## 2020-12-05 NOTE — Progress Notes (Signed)
HPI Ms. Warr returns today for followup. 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 She had a prolonged QT and was diagnosed with Long QT syndrome. Unfortunately her genetic testing was negative. Her VF was related to alarm clock going off as well as episodes of increased stress associated with her dogs fighting. She also has sinus node dysfunction and RBBB and there has been a question of Brugada syndrome. She has been on mexitil and a beta blocker. She has recently developed sustained atrial arrhythmias and was visiting her daughter from [place] when she felt her heart racing and has been noted to have atypical atrial flutter. She has had 1:1 and 2:1 AV conduction. She was cardioverted last week. She has been placed on anti-coagulation.  No Known Allergies   Current Outpatient Medications  Medication Sig Dispense Refill  . acetaminophen (TYLENOL) 500 MG tablet Take 1,000 mg by mouth every 6 (six) hours as needed for mild pain or headache.    . levonorgestrel (MIRENA, 52 MG,) 20 MCG/24HR IUD 1 Intra Uterine Device by Intrauterine route.    . metoprolol succinate (TOPROL-XL) 50 MG 24 hr tablet Take 1 tablet (50 mg total) by mouth 2 (two) times daily. Take with or immediately following a meal. 60 tablet 3  . mexiletine (MEXITIL) 150 MG capsule Take 1 capsule (150 mg total) by mouth 2 (two) times daily. Please make overdue appt with Dr. Ladona Ridgel before anymore refills. 1st attempt 60 capsule 0  . OVER THE COUNTER MEDICATION Place 1 drop into both eyes daily as needed (dry/ irritated  eyes).    . potassium chloride SA (K-DUR) 20 MEQ tablet TAKE 2 TABLETS BY MOUTH TWICE DAILY 120 tablet 6   No current facility-administered medications for this visit.     Past Medical History:  Diagnosis Date  . AICD (automatic cardioverter/defibrillator) present   . Atrial flutter (HCC)   . IUD (intrauterine device) in place 2016  . Long Q-T syndrome    . Myocardial infarction (HCC)    due to long QT  . Presence of permanent cardiac pacemaker   . Symptomatic bradycardia   . Ventricular fibrillation (HCC)     ROS:   All systems reviewed and negative except as noted in the HPI.   Past Surgical History:  Procedure Laterality Date  . ADENOIDECTOMY    . BILATERAL SALPINGECTOMY Bilateral 02/13/2015   Procedure: BILATERAL SALPINGECTOMY;  Surgeon: Ok Edwards, MD;  Location: [facility];  Service: Gynecology;  Laterality: Bilateral;  . CARDIOVERSION N/A 11/30/2020   Procedure: CARDIOVERSION;  Surgeon: Little Ishikawa, MD;  Location: [facility] ENDOSCOPY;  Service: Cardiovascular;  Laterality: N/A;  . CESAREAN SECTION  1994  . DILATION AND CURETTAGE OF UTERUS  1999  . IMPLANTABLE CARDIOVERTER DEFIBRILLATOR (ICD) GENERATOR CHANGE N/A 11/18/2013   Procedure: ICD GENERATOR CHANGE;  Surgeon: Marinus Maw, MD;  Location: [facility] CATH LAB;  Service: Cardiovascular;  Laterality: N/A;  . IMPLANTABLE CARDIOVERTER DEFIBRILLATOR IMPLANT  2009; 11-18-13   MDT single chamber ICD implanted by Dr Ladona Ridgel 2009; upgrade to dual chamber MDT ICD 11/2013 by Dr Ladona Ridgel  . INTRAUTERINE DEVICE INSERTION  February 13, 2011   PARAGUARD   . LAPAROSCOPIC BILATERAL SALPINGECTOMY Bilateral 02/13/2015   Procedure: LAPAROSCOPIC BILATERAL SALPINGECTOMY;  Surgeon: Ok Edwards, MD;  Location: [facility];  Service: Gynecology;  Laterality: Bilateral;  . OOPHORECTOMY Right 02/13/2015   Procedure: OOPHORECTOMY;  Surgeon: Lars Mage  Karoline Caldwell, MD;  Location: [facility];  Service: Gynecology;  Laterality: Right;  . TEE WITHOUT CARDIOVERSION N/A 11/30/2020   Procedure: TRANSESOPHAGEAL ECHOCARDIOGRAM (TEE);  Surgeon: Little Ishikawa, MD;  Location: [facility] ENDOSCOPY;  Service: Cardiovascular;  Laterality: N/A;  . TYMPANOSTOMY TUBE PLACEMENT    . wisdom teeeth        Family History  Problem Relation Age of Onset  . Hypertension Father   . Heart disease Father        PACEMAKER INSERTED  .  Diabetes Maternal Grandmother   . Cancer Maternal Grandmother        COLON CANCER     Social History   Socioeconomic History  . Marital status: Married    Spouse name: Not on file  . Number of children: Not on file  . Years of education: Not on file  . Highest education level: Not on file  Occupational History  . Not on file  Tobacco Use  . Smoking status: Never Smoker  . Smokeless tobacco: Never Used  Vaping Use  . Vaping Use: Never used  Substance and Sexual Activity  . Alcohol use: Yes    Comment: occasionally  . Drug use: No  . Sexual activity: Yes    Partners: Male    Birth control/protection: I.U.D.    Comment: PARAGARD  Other Topics Concern  . Not on file  Social History Narrative  . Not on file   Social Determinants of Health   Financial Resource Strain: Not on file  Food Insecurity: Not on file  Transportation Needs: Not on file  Physical Activity: Not on file  Stress: Not on file  Social Connections: Not on file  Intimate Partner Violence: Not on file     BP 102/62   Pulse (!) 50   Ht 5\' 4"  (1.626 m)   Wt 154 lb 9.6 oz (70.1 kg)   SpO2 99%   BMI 26.54 kg/m   Physical Exam:  Well appearing middle aged woman, NAD HEENT: Unremarkable Neck:  No JVD, no thyromegally Lymphatics:  No adenopathy Back:  No CVA tenderness Lungs:  Clear with no wheezes HEART:  Regular rate rhythm, no murmurs, no rubs, no clicks Abd:  soft, positive bowel sounds, no organomegally, no rebound, no guarding Ext:  2 plus pulses, no edema, no cyanosis, no clubbing Skin:  No rashes no nodules Neuro:  CN II through XII intact, motor grossly intact  EKG - Sins brady with atrial pacing and RBBB and prolonged QT  DEVICE  Normal device function.  See PaceArt for details.   Assess/Plan: 1. Atrial fib and flutter - she clearly has both and her flutter really looks atypical. I have recommended watchful waiting. There is no good AA drug in the setting of baseline QT  prolongation. She will continue her beta blocker. 2. VF - she has not had any ventricular arrhythmias. Unfortunately we do not know the mechanism for certain. She has undergone genetic testing years ago which was normal with no QT prolonging gene identified. Her QT is prolonged. She has sinus node dysfunction. I think a referral to a channelopathy specialist might be in order. We will arrange.  3. Sinus node dysfunction - she is atrial pacing and asymptomatic.  4. Hypotension - I encouraged her to maintain plenty of salt in her diet.   Maeve Debord,MD

## 2020-12-05 NOTE — Patient Instructions (Addendum)
Medication Instructions:  Your physician has recommended you make the following change in your medication:  1.  STOP Eliquis  Labwork: None ordered.  Testing/Procedures: None ordered.  Follow-Up: Your physician wants you to follow-up to be determined with Lewayne Bunting, MD   Remote monitoring is used to monitor your ICD from home. This monitoring reduces the number of office visits required to check your device to one time per year. It allows [place] to keep an eye on the functioning of your device to ensure it is working properly. You are scheduled for a device check from home on 03/26/2021. You may send your transmission at any time that day. If you have a wireless device, the transmission will be sent automatically. After your physician reviews your transmission, you will receive a postcard with your next transmission date.  Any Other Special Instructions Will Be Listed Below (If Applicable).  If you need a refill on your cardiac medications before your next appointment, please call your pharmacy.

## 2020-12-05 NOTE — Telephone Encounter (Signed)
Request made in [facility] to have patient transferred to [facility] device clinic. Contacted [employer] in [place] , [place] and notfied the practice that patient is going to be followed by Dr Ladona Ridgel. DC in [place] will be notified to release patient in [place] .

## 2020-12-06 NOTE — Telephone Encounter (Signed)
I called the [facility] and their phone is not in service. I called the [phone].

## 2020-12-18 NOTE — Telephone Encounter (Signed)
The patient is now in our [facility] system.

## 2020-12-21 ENCOUNTER — Encounter (HOSPITAL_COMMUNITY): Payer: Self-pay

## 2020-12-21 ENCOUNTER — Emergency Department (HOSPITAL_COMMUNITY)
Admission: EM | Admit: 2020-12-21 | Discharge: 2020-12-21 | Disposition: A | Discharge status: Home / Self Care | Payer: Managed Care, Other (non HMO) | Attending: Emergency Medicine | Admitting: Emergency Medicine

## 2020-12-21 ENCOUNTER — Emergency Department (HOSPITAL_COMMUNITY): Payer: Managed Care, Other (non HMO)

## 2020-12-21 ENCOUNTER — Other Ambulatory Visit: Payer: Self-pay

## 2020-12-21 ENCOUNTER — Telehealth: Payer: Self-pay | Admitting: Internal Medicine

## 2020-12-21 ENCOUNTER — Other Ambulatory Visit: Payer: Self-pay | Admitting: Physician Assistant

## 2020-12-21 DIAGNOSIS — Z95810 Presence of automatic (implantable) cardiac defibrillator: Secondary | ICD-10-CM | POA: Insufficient documentation

## 2020-12-21 DIAGNOSIS — Z79899 Other long term (current) drug therapy: Secondary | ICD-10-CM | POA: Insufficient documentation

## 2020-12-21 DIAGNOSIS — R0789 Other chest pain: Secondary | ICD-10-CM | POA: Diagnosis not present

## 2020-12-21 DIAGNOSIS — R079 Chest pain, unspecified: Secondary | ICD-10-CM

## 2020-12-21 DIAGNOSIS — I4891 Unspecified atrial fibrillation: Secondary | ICD-10-CM | POA: Insufficient documentation

## 2020-12-21 DIAGNOSIS — I4581 Long QT syndrome: Secondary | ICD-10-CM | POA: Diagnosis not present

## 2020-12-21 DIAGNOSIS — M542 Cervicalgia: Secondary | ICD-10-CM | POA: Insufficient documentation

## 2020-12-21 LAB — BASIC METABOLIC PANEL
Anion gap: 6 (ref 5–15)
BUN: 11 mg/dL (ref 6–20)
CO2: 25 mmol/L (ref 22–32)
Calcium: 9.6 mg/dL (ref 8.9–10.3)
Chloride: 108 mmol/L (ref 98–111)
Creatinine, Ser: 0.81 mg/dL (ref 0.44–1.00)
GFR, Estimated: >60 mL/min (ref >60)
Glucose, Bld: 105 mg/dL — ABNORMAL HIGH (ref 70–99)
Potassium: 4.5 mmol/L (ref 3.5–5.1)
Sodium: 139 mmol/L (ref 135–145)

## 2020-12-21 LAB — CBC
HCT: 45.6 % (ref 36.0–46.0)
Hemoglobin: 14.8 g/dL (ref 12.0–15.0)
MCH: 32.3 pg (ref 26.0–34.0)
MCHC: 32.5 g/dL (ref 30.0–36.0)
MCV: 99.6 fL (ref 80.0–100.0)
Platelets: 229 10*3/uL (ref 150–400)
RBC: 4.58 MIL/uL (ref 3.87–5.11)
RDW: 12.2 % (ref 11.5–15.5)
WBC: 8.7 10*3/uL (ref 4.0–10.5)
nRBC: 0 % (ref 0.0–0.2)

## 2020-12-21 LAB — I-STAT BETA HCG BLOOD, ED (MC, WL, AP ONLY): I-stat hCG, quantitative: <5 m[IU]/mL (ref <5)

## 2020-12-21 LAB — TROPONIN I (HIGH SENSITIVITY)
Troponin I (High Sensitivity): 4 ng/L (ref <18)
Troponin I (High Sensitivity): 5 ng/L (ref <18)

## 2020-12-21 NOTE — ED Provider Notes (Signed)
[facility] EMERGENCY DEPARTMENT Provider Note   CSN: [id number] Arrival date & time: 12/21/20  0557   History Chief Complaint  Patient presents with  . Chest Pain   [name] is a 55 y.o. female with past medical history of prior VFib arrest, recurrent VT status post ICD, long QT syndrome, sinus node dysfunction and RBBB and atrial fibrillation/atrial flutter status post cardioversion in April 2022 (not currently on anticoagulation) who presents to the ED for evaluation of chest pain.  Patient reports that yesterday afternoon, she noticed some chest discomfort with deep breathing which she attributed to being musculoskeletal pain. Later that evening at approximately 2AM, she woke up with 7 out of 10 mid sternal chest aching and pressure that radiated to her jaw, neck and between her shoulder blades. She states that since onset, her symptoms had persisted and given her two recent hospitalizations, she was concerned and called EMS. She denies any associated symptoms including shortness of breath, cough, abdominal pain, nausea, vomiting, diarrhea, diaphoresis, palpitations, headaches, lightheadedness, dizziness, fevers, chills.  HPI: A 55 year old patient presents for evaluation of chest pain. Initial onset of pain was less than one hour ago. The patient's chest pain is not worse with exertion. The patient's chest pain is not middle- or left-sided, is not well-localized, is not described as heaviness/pressure/tightness, is not sharp and does radiate to the arms/jaw/neck. The patient does not complain of nausea and denies diaphoresis. The patient has no history of stroke, has no history of peripheral artery disease, has not smoked in the past 90 days, denies any history of treated diabetes, has no relevant family history of coronary artery disease (first degree relative at less than age 78), is not hypertensive, has no history of hypercholesterolemia and does not have an  elevated BMI (>=30).  Past Medical History:  Diagnosis Date  . AICD (automatic cardioverter/defibrillator) present   . Atrial flutter (HCC)   . IUD (intrauterine device) in place 2016  . Long Q-T syndrome   . Myocardial infarction (HCC)    due to long QT  . Presence of permanent cardiac pacemaker   . Symptomatic bradycardia   . Ventricular fibrillation [facility])    Patient Active Problem List   Diagnosis Date Noted  . Sinus node dysfunction (HCC) 11/30/2020  . Atrial flutter (HCC) 11/29/2020  . Ventricular tachyarrhythmia (HCC) 11/04/2020  . Ventricular fibrillation (HCC)   . Symptomatic bradycardia   . Presence of permanent cardiac pacemaker   . Myocardial infarction (HCC)   . Long Q-T syndrome   . AICD (automatic cardioverter/defibrillator) present   . IUD (intrauterine device) in place 03/01/2015  . Ovarian cyst, left 01/19/2015  . Atrial flutter with rapid ventricular response (HCC) 09/07/2014  . Atrial fibrillation (HCC) 12/27/2013  . Hydrosalpinx 09/16/2011  . Automatic implantable cardioverter-defibrillator in situ 08/23/2009  . HYPOGLYCEMIA, UNSPECIFIED 03/02/2009  . HYPOPOTASSEMIA 03/02/2009  . Long QT syndrome 01/18/2009  . Cardiac arrest (HCC) 01/18/2009   Past Surgical History:  Procedure Laterality Date  . ADENOIDECTOMY    . BILATERAL SALPINGECTOMY Bilateral 02/13/2015   Procedure: BILATERAL SALPINGECTOMY;  Surgeon: Ok Edwards, MD;  Location: [facility];  Service: Gynecology;  Laterality: Bilateral;  . CARDIOVERSION N/A 11/30/2020   Procedure: CARDIOVERSION;  Surgeon: Little Ishikawa, MD;  Location: [facility] ENDOSCOPY;  Service: Cardiovascular;  Laterality: N/A;  . CESAREAN SECTION  1994  . DILATION AND CURETTAGE OF UTERUS  1999  . IMPLANTABLE CARDIOVERTER DEFIBRILLATOR (ICD) GENERATOR CHANGE N/A 11/18/2013   Procedure:  ICD GENERATOR CHANGE;  Surgeon: Marinus Maw, MD;  Location: [facility] CATH LAB;  Service: Cardiovascular;  Laterality: N/A;  . IMPLANTABLE  CARDIOVERTER DEFIBRILLATOR IMPLANT  2009; 11-18-13   MDT single chamber ICD implanted by Dr Ladona Ridgel 2009; upgrade to dual chamber MDT ICD 11/2013 by Dr Ladona Ridgel  . INTRAUTERINE DEVICE INSERTION  February 13, 2011   PARAGUARD   . LAPAROSCOPIC BILATERAL SALPINGECTOMY Bilateral 02/13/2015   Procedure: LAPAROSCOPIC BILATERAL SALPINGECTOMY;  Surgeon: Ok Edwards, MD;  Location: [facility];  Service: Gynecology;  Laterality: Bilateral;  . OOPHORECTOMY Right 02/13/2015   Procedure: OOPHORECTOMY;  Surgeon: Ok Edwards, MD;  Location: [facility];  Service: Gynecology;  Laterality: Right;  . TEE WITHOUT CARDIOVERSION N/A 11/30/2020   Procedure: TRANSESOPHAGEAL ECHOCARDIOGRAM (TEE);  Surgeon: Little Ishikawa, MD;  Location: [facility] ENDOSCOPY;  Service: Cardiovascular;  Laterality: N/A;  . TYMPANOSTOMY TUBE PLACEMENT    . wisdom teeeth      OB History    Gravida  5   Para  4   Term      Preterm      AB  1   Living  4     SAB  1   IAB      Ectopic      Multiple      Live Births             Family History  Problem Relation Age of Onset  . Hypertension Father   . Heart disease Father        PACEMAKER INSERTED  . Diabetes Maternal Grandmother   . Cancer Maternal Grandmother        COLON CANCER   Social History   Tobacco Use  . Smoking status: Never Smoker  . Smokeless tobacco: Never Used  Vaping Use  . Vaping Use: Never used  Substance Use Topics  . Alcohol use: Yes    Comment: occasionally  . Drug use: No   Home Medications Prior to Admission medications   Medication Sig Start Date End Date Taking? Authorizing Provider  acetaminophen (TYLENOL) 500 MG tablet Take 1,000 mg by mouth every 6 (six) hours as needed for mild pain or headache.    [provider]  levonorgestrel (MIRENA, 52 MG,) 20 MCG/24HR IUD 1 Intra Uterine Device by Intrauterine route.    [provider]  metoprolol succinate (TOPROL-XL) 50 MG 24 hr tablet Take 1 tablet (50 mg total) by mouth  2 (two) times daily. Take with or immediately following a meal. 11/30/20   Furth, Cadence H, PA-C  mexiletine (MEXITIL) 150 MG capsule Take 1 capsule (150 mg total) by mouth 2 (two) times daily. Please make overdue appt with Dr. Ladona Ridgel before anymore refills. 1st attempt 03/29/20   Marinus Maw, MD  OVER THE COUNTER MEDICATION Place 1 drop into both eyes daily as needed (dry/ irritated  eyes).    [provider]  potassium chloride SA (K-DUR) 20 MEQ tablet TAKE 2 TABLETS BY MOUTH TWICE DAILY 03/22/19   Marinus Maw, MD   Allergies    Patient has no known allergies.  Review of Systems   Review of Systems  Constitutional: Negative for chills and fever.  HENT: Negative for ear pain and sore throat.   Eyes: Negative for pain and visual disturbance.  Respiratory: Negative for cough and shortness of breath.   Cardiovascular: Positive for chest pain. Negative for palpitations.  Gastrointestinal: Negative for abdominal pain and vomiting.  Genitourinary: Negative for dysuria and hematuria.  Musculoskeletal: Positive for back pain and neck pain. Negative for arthralgias.  Skin: Negative for color change and rash.  Neurological: Negative for seizures, syncope, light-headedness and headaches.  All other systems reviewed and are negative.  Physical Exam Updated Vital Signs BP 106/66   Pulse (!) 50   Temp 98.4 F (36.9 C) (Oral)   Resp 15   Ht 5\' 4"  (1.626 m)   Wt 67.1 kg   SpO2 95%   BMI 25.40 kg/m   Physical Exam Vitals and nursing note reviewed.  Constitutional:      General: She is not in acute distress.    Appearance: She is well-developed.  HENT:     Head: Normocephalic and atraumatic.  Eyes:     Conjunctiva/sclera: Conjunctivae normal.  Cardiovascular:     Rate and Rhythm: Regular rhythm. Bradycardia present.     Heart sounds: Normal heart sounds. No murmur heard.   Pulmonary:     Effort: Pulmonary effort is normal. No respiratory distress.     Breath sounds:  Normal breath sounds.  Chest:     Chest wall: Tenderness present.     Comments: Minimal tenderness overlying the lower sternum Abdominal:     Palpations: Abdomen is soft.     Tenderness: There is no abdominal tenderness.  Musculoskeletal:     Cervical back: Neck supple.  Skin:    General: Skin is warm and dry.  Neurological:     Mental Status: She is alert.    ED Results / Procedures / Treatments   Labs (all labs ordered are listed, but only abnormal results are displayed) Labs Reviewed  BASIC METABOLIC PANEL - Abnormal; Notable for the following components:      Result Value   Glucose, Bld 105 (*)    All other components within normal limits  CBC  I-STAT BETA HCG BLOOD, ED ([facility], [facility], [facility] ONLY)  TROPONIN I (HIGH SENSITIVITY)  TROPONIN I (HIGH SENSITIVITY)   EKG EKG Interpretation  Date/Time:  Friday Dec 21 2020 06:05:09 EDT Ventricular Rate:  50 PR Interval:  218 QRS Duration: 185 QT Interval:  444 QTC Calculation: 405 R Axis:   -71 Text Interpretation: ATRIAL PACED RHYTHM Prolonged PR interval RBBB and LAFB Probable left ventricular hypertrophy Reconfirmed by Tilden Fossa [phone]) on 12/21/2020 7:58:48 AM  Radiology DG Chest 2 View  Result Date: 12/21/2020 CLINICAL DATA:  55 year old female with chest pain radiating to the right shoulder and jaw. EXAM: CHEST - 2 VIEW COMPARISON:  Chest radiographs 11/28/2020 and earlier. FINDINGS: Lower lung volumes. Stable left chest AICD. Stable cardiomegaly and mediastinal contours., with evidence of left atrial enlargement on the lateral. Scoliosis. No pneumothorax, pulmonary edema, pleural effusion or acute pulmonary opacity. There is mild chronic retrocardiac atelectasis suspected. Visualized tracheal air column is within normal limits. No acute osseous abnormality identified. Negative visible bowel gas pattern. IMPRESSION: Chronic cardiomegaly. Lower lung volumes with otherwise no acute cardiopulmonary abnormality. Electronically  Signed   By: Odessa Fleming M.D.   On: 12/21/2020 06:22   Medications Ordered in ED Medications - No data to display  ED Course  I have reviewed the triage vital signs and the nursing notes.  Pertinent labs & imaging results that were available during my care of the patient were reviewed by me and considered in my medical decision making (see chart for details).    MDM Rules/Calculators/A&P HEAR Score: 2  Patient with complex cardiac history and two hospitalizations with cardiology service since April presenting with sudden onset significant chest pain radiating to the jaw, neck and back. Patient's symptoms primarily concerning for ACS, however EKG with no new ischemic changes and initial troponin of 4 with repeat of 5. Interrogation of patient's device does not reveal any arrhythmias/high rate episodes since last interrogation on May 4th although there is "possible OptiVol fluid accumulation." Due to patient's extensive cardiac history, patient would benefit from cardiology consultation. Discussed case with cardiology who agrees to come see patient in emergency department.  Discussed case in-person with Azalee Course with cardiology. Recommendation for outpatient Myoview which is currently being arranged. Patient to discharge home with close outpatient cardiology follow-up.  Final Clinical Impression(s) / ED Diagnoses Final diagnoses:  Atypical chest pain   Rx / DC Orders ED Discharge Orders    None       Roylene Reason, MD 12/21/20 1317    Tilden Fossa, MD 12/23/20 1215

## 2020-12-21 NOTE — ED Triage Notes (Signed)
Patient BIB [facility] from home, reports midsternal chest pain radiating into her jaw and between her shoulder blades and into the back of her neck  EMS vitals  123/68 50 HR 98% RA Medtronic pacemaker  324mg  ASA No blood thinners

## 2020-12-21 NOTE — Telephone Encounter (Signed)
Pt is currently in the ED at [facility] being seen for Chest pain, pt states no one is telling her anything, the ED Physician came in a few hours ago and told her that her heart and labs are fine but she still continues to have CP. Pt wants to know what she should do moving forward.

## 2020-12-21 NOTE — Discharge Instructions (Addendum)
Ms. Bascom,  It was a pleasure meeting you in the Emergency Department. We have ruled out serious cardiac illness causing your chest discomfort. We have discussed your case with cardiology who recommends follow-up in the outpatient setting for outpatient Myoview. Please follow-up closely with this appointment for further evaluation. If you develop new or worsening chest pain, please present to the emergency department.   Sincerely, Dr. Jasmine December, MD

## 2020-12-21 NOTE — Telephone Encounter (Signed)
Called and spoke to [name] and noted that from ED note:  Due to patient's extensive cardiac history, patient would benefit from cardiology consultation. Discussed case with cardiology who agrees to come see patient in emergency department..  Patient states someone already came from cardiology and was talking to her about a test with contrast. Advised the patient to call the RN taking care of her. That the information I told her was all I could see on my end. Patient verbalized understanding.

## 2020-12-21 NOTE — Addendum Note (Signed)
Addended by: Sampson Goon on: 12/21/2020 03:34 PM   Modules accepted: Orders

## 2020-12-21 NOTE — ED Notes (Signed)
Patient transported to X-ray 

## 2020-12-21 NOTE — ED Notes (Signed)
Medtronic pacemaker interrogated at this time.

## 2020-12-21 NOTE — Consult Note (Addendum)
Cardiology Consultation:   Patient ID: [name] MRN: [id number]; DOB: Jul 05, 1966  Admit date: 12/21/2020 Date of Consult: 12/21/2020  PCP:  Patient, No Pcp Per (Inactive)   [facility] Providers Cardiologist:  Lewayne Bunting, MD L off of   Patient Profile:   [name] is a 55 y.o. female with a hx of V. 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 Madilyn Hook.  History of Present Illness:   [name] is a 55 year old female with past medical history of V. 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  Although patient has moved to [place], she will frequently visit in [place] to be with her daughter and grandkids.  Patient was first evaluated by our cardiology service on 11/04/2020 after getting inappropriately shocked by her ICD while watching to [facility] game.  Device interrogation shows she was in 1:1 conducted atrial flutter.  Dr. Graciela Husbands unsuccessfully tried to pace terminate the atrial flutter and eventually cardioverted the patient prior to discharge.  According to Dr. Odessa Fleming note, part of her story is not consistent with long QT syndrome as primary diagnosis given sinus node dysfunction, bifascicular block and a small R wave at the implant.  It was recommended she is evaluated by someone with specific EP expertise with channelopathies.  She came back to the hospital in late April with palpitation and was noted to be back in atrial flutter.  Patient underwent TEE DCCV by Dr. Bjorn Pippin and was placed on Eliquis.  Patient was eventually  discharged on Eliquis, metoprolol, mexiletine and potassium. Since discharge, Eliquis was discontinued by Dr. Ladona Ridgel during last visit.   Patient was in her usual state of health until 12/21/2018, she started noticing substernal chest discomfort radiating to the jaw that lasted about 30 minutes.  Symptom is not associated with diaphoresis.  It is worse with deep inspiration.  Overnight, she had a episode of severe chest pain that woke her up around 2 AM in the morning of 12/21/2020.  Symptom waxed and waned however never completely went away in the past 10 hours.  Serial troponin was negative.  EKG is unchanged.  Device interrogation by ED physicians did not show any significant irregular rhythm.  Her chest pain has improved again and has not completely went away.  Chest x-ray shows no acute abnormality.  Cardiology has been consulted for atypical chest pain.  Talking with the patient, she does not remember ever having a cardiac catheterization in the past, even when she had a V. fib arrest in 2009.   Past Medical History:  Diagnosis Date  . AICD (automatic cardioverter/defibrillator) present   . Atrial flutter (HCC)   . IUD (intrauterine device) in place 2016  . Long Q-T syndrome   . Myocardial infarction (HCC)    due to long QT  . Presence of permanent cardiac pacemaker   . Symptomatic bradycardia   . Ventricular fibrillation [facility])     Past Surgical History:  Procedure Laterality Date  . ADENOIDECTOMY    . BILATERAL SALPINGECTOMY Bilateral 02/13/2015   Procedure: BILATERAL SALPINGECTOMY;  Surgeon: Ok Edwards, MD;  Location:  [facility]S;  Service: Gynecology;  Laterality: Bilateral;  . CARDIOVERSION N/A 11/30/2020   Procedure: CARDIOVERSION;  Surgeon: Little Ishikawa, MD;  Location: [facility] ENDOSCOPY;  Service: Cardiovascular;  Laterality: N/A;  . CESAREAN SECTION  1994  . DILATION AND CURETTAGE OF UTERUS  1999  . IMPLANTABLE CARDIOVERTER DEFIBRILLATOR (ICD) GENERATOR CHANGE N/A  11/18/2013   Procedure: ICD GENERATOR CHANGE;  Surgeon: Marinus Maw, MD;  Location: [facility] CATH LAB;  Service: Cardiovascular;  Laterality: N/A;  . IMPLANTABLE CARDIOVERTER DEFIBRILLATOR IMPLANT  2009; 11-18-13   MDT single chamber ICD implanted by Dr Ladona Ridgel 2009; upgrade to dual chamber MDT ICD 11/2013 by Dr Ladona Ridgel  . INTRAUTERINE DEVICE INSERTION  February 13, 2011   PARAGUARD   . LAPAROSCOPIC BILATERAL SALPINGECTOMY Bilateral 02/13/2015   Procedure: LAPAROSCOPIC BILATERAL SALPINGECTOMY;  Surgeon: Ok Edwards, MD;  Location: [facility];  Service: Gynecology;  Laterality: Bilateral;  . OOPHORECTOMY Right 02/13/2015   Procedure: OOPHORECTOMY;  Surgeon: Ok Edwards, MD;  Location: [facility];  Service: Gynecology;  Laterality: Right;  . TEE WITHOUT CARDIOVERSION N/A 11/30/2020   Procedure: TRANSESOPHAGEAL ECHOCARDIOGRAM (TEE);  Surgeon: Little Ishikawa, MD;  Location[facility] ENDOSCOPY;  Service: Cardiovascular;  Laterality: N/A;  . TYMPANOSTOMY TUBE PLACEMENT    . wisdom teeeth        Home Medications:  Prior to Admission medications   Medication Sig Start Date End Date Taking? Authorizing Provider  acetaminophen (TYLENOL) 500 MG tablet Take 1,000 mg by mouth every 6 (six) hours as needed for mild pain or headache.    [provider]  levonorgestrel (MIRENA, 52 MG,) 20 MCG/24HR IUD 1 Intra Uterine Device by Intrauterine route.    [provider]  metoprolol succinate (TOPROL-XL) 50 MG 24 hr tablet Take 1 tablet (50 mg total) by mouth 2 (two) times daily. Take with or immediately following a meal. 11/30/20   Furth, Cadence H, PA-C  mexiletine (MEXITIL) 150 MG capsule Take 1 capsule (150 mg total) by mouth 2 (two) times daily. Please make overdue appt with Dr. Ladona Ridgel before anymore refills. 1st attempt 03/29/20   Marinus Maw, MD  OVER THE COUNTER MEDICATION Place 1 drop into both eyes daily as needed (dry/ irritated  eyes).    [provider]  potassium chloride SA  (K-DUR) 20 MEQ tablet TAKE 2 TABLETS BY MOUTH TWICE DAILY 03/22/19   Marinus Maw, MD    Inpatient Medications: Scheduled Meds:  Continuous Infusions:  PRN Meds:   Allergies:   No Known Allergies  Social History:   Social History   Socioeconomic History  . Marital status: Married    Spouse name: Not on file  . Number of children: Not on file  . Years of education: Not on file  . Highest education level: Not on file  Occupational History  . Not on file  Tobacco Use  . Smoking status: Never Smoker  . Smokeless tobacco: Never Used  Vaping Use  . Vaping Use: Never used  Substance and Sexual Activity  . Alcohol use: Yes    Comment: occasionally  . Drug use: No  . Sexual activity: Yes    Partners: Male    Birth control/protection: I.U.D.    Comment: PARAGARD  Other Topics Concern  . Not on file  Social History Narrative  . Not on file   Social Determinants of Health   Financial Resource Strain: Not on file  Food Insecurity: Not on file  Transportation Needs: Not on file  Physical  Activity: Not on file  Stress: Not on file  Social Connections: Not on file  Intimate Partner Violence: Not on file    Family History:    Family History  Problem Relation Age of Onset  . Hypertension Father   . Heart disease Father        PACEMAKER INSERTED  . Diabetes Maternal Grandmother   . Cancer Maternal Grandmother        COLON CANCER     ROS:  Please see the history of present illness.   All other ROS reviewed and negative.     Physical Exam/Data:   Vitals:   12/21/20 0900 12/21/20 0915 12/21/20 0930 12/21/20 1015  BP: 94/63 (!) 97/59 112/61 106/66  Pulse: (!) 50 (!) 50 (!) 50 (!) 50  Resp: (!) 22 (!) 23 (!) 29 15  Temp:      TempSrc:      SpO2: 97% 98% 97% 95%  Weight:      Height:       No intake or output data in the 24 hours ending 12/21/20 1249 Last 3 Weights 12/21/2020 12/05/2020 11/30/2020  Weight (lbs) 148 lb 154 lb 9.6 oz 148 lb 6.4 oz  Weight (kg)  67.132 kg 70.126 kg 67.314 kg     Body mass index is 25.4 kg/m.  General:  Well nourished, well developed, in no acute distress HEENT: normal Lymph: no adenopathy Neck: no JVD Endocrine:  No thryomegaly Vascular: No carotid bruits; FA pulses 2+ bilaterally without bruits  Cardiac:  normal S1, S2; RRR; no murmur  Lungs:  clear to auscultation bilaterally, no wheezing, rhonchi or rales  Abd: soft, nontender, no hepatomegaly  Ext: no edema Musculoskeletal:  No deformities, BUE and BLE strength normal and equal Skin: warm and dry  Neuro:  CNs 2-12 intact, no focal abnormalities noted Psych:  Normal affect   EKG:  The EKG was personally reviewed and demonstrates:  Sinus bradycardia with RBBB and LAFB Telemetry:  Telemetry was personally reviewed and demonstrates:  Sinus rhythm with occasional paced rhythm  Relevant CV Studies:  TEE 11/30/2020 1. Left ventricular ejection fraction, by estimation, is 50 to 55%. The  left ventricle has low normal function. Mild systolic dysfunction on TEE  images prior to cardioversion, with improvement to low normal systolic  function on TTE images  post-cardioversion  2. Right ventricular systolic function is normal. The right ventricular  size is normal.  3. No left atrial/left atrial appendage thrombus was detected.  4. The mitral valve is normal in structure. Trivial mitral valve  regurgitation.  5. The aortic valve is tricuspid. Aortic valve regurgitation is not  visualized. No aortic stenosis is present.  6. The inferior vena cava is normal in size with greater than 50%  respiratory variability, suggesting right atrial pressure of 3 mmHg.   Conclusion(s)/Recommendation(s): No LA/LAA thrombus identified. Successful  cardioversion performed with restoration of normal sinus rhythm.    Laboratory Data:  High Sensitivity Troponin:   Recent Labs  Lab 11/28/20 2107 11/28/20 2322 12/21/20 0616 12/21/20 0819  TROPONINIHS 15 16 4 5       Chemistry Recent Labs  Lab 12/21/20 0616  NA 139  K 4.5  CL 108  CO2 25  GLUCOSE 105*  BUN 11  CREATININE 0.81  CALCIUM 9.6  GFRNONAA >60  ANIONGAP 6    No results for input(s): PROT, ALBUMIN, AST, ALT, ALKPHOS, BILITOT in the last 168 hours. Hematology Recent Labs  Lab 12/21/20 0616  WBC 8.7  RBC 4.58  HGB 14.8  HCT 45.6  MCV 99.6  MCH 32.3  MCHC 32.5  RDW 12.2  PLT 229   BNPNo results for input(s): BNP, PROBNP in the last 168 hours.  DDimer No results for input(s): DDIMER in the last 168 hours.   Radiology/Studies:  DG Chest 2 View  Result Date: 12/21/2020 CLINICAL DATA:  55 year old female with chest pain radiating to the right shoulder and jaw. EXAM: CHEST - 2 VIEW COMPARISON:  Chest radiographs 11/28/2020 and earlier. FINDINGS: Lower lung volumes. Stable left chest AICD. Stable cardiomegaly and mediastinal contours., with evidence of left atrial enlargement on the lateral. Scoliosis. No pneumothorax, pulmonary edema, pleural effusion or acute pulmonary opacity. There is mild chronic retrocardiac atelectasis suspected. Visualized tracheal air column is within normal limits. No acute osseous abnormality identified. Negative visible bowel gas pattern. IMPRESSION: Chronic cardiomegaly. Lower lung volumes with otherwise no acute cardiopulmonary abnormality. Electronically Signed   By: Odessa Fleming M.D.   On: 12/21/2020 06:22     Assessment and Plan:   1. Atypical chest pain: So far she has had prolonged chest pain for 10 hours, with negative troponin x2.  Chest pain is worse with deep inspiration and better with palpation.  Recent TEE shows normal ejection fraction 50 to 55% without wall motion abnormality.  Recommend pain management in the ED, will continue to monitor as outpatient and obtain outpatient myoview.   2. History of atrial fibrillation/atrial flutter: Eliquis was stopped by Dr. Ladona Ridgel during last office visit.  Heart rate borderline low, patient is asymptomatic  without dizziness.  3. History of recurrent VT and V. fib arrest s/p Medtronic dual-chamber ICD: On mexiletine and beta-blocker at home.  4. History of sinus node dysfunction    Risk Assessment/Risk Scores:     HEAR Score (for undifferentiated chest pain):  HEAR Score: 2          For questions or updates, please contact [facility] Please consult [email or web address] for contact info under    Signed, Azalee Course, [place]  12/21/2020 12:49 PM   Patient seen, examined. Available data reviewed. Agree with findings, assessment, and plan as outlined by Azalee Course, PA.  The patient is independently interviewed and examined.  She is a well-appearing 55 year old woman in no distress.  HEENT is normal, JVP is normal, carotid upstrokes are normal without bruits, lungs are clear to auscultation bilaterally, heart is regular rate and rhythm with no murmur or gallop, abdomen is soft and nontender, extremities have no edema, there is no tenderness to palpation of the chest wall.  The patient presents with atypical chest pain, resting symptoms with some pleuritic component involving discomfort in the epigastrium.  There is radiation of pain to the teeth.  Symptoms have fully resolved.  High-sensitivity troponin is negative x2.  EKG shows atrial pacing with bifascicular block (right bundle branch block/left anterior fascicular block ).   I suspect the patient is having chest wall discomfort or costochondritis.  Short course of nonsteroidal anti-inflammatories would be reasonable.  She does not require hospital admission from a cardiac perspective.  While she has a complex cardiac history related to her arrhythmia, there is no evidence of any new heart rhythm problem based on interrogation of her pacemaker device.  We will arrange an outpatient stress test in the office for complete evaluation of her chest discomfort.  A Lexiscan Myoview stress test will be performed.  Tonny Bollman, M.D. 12/21/2020 1:53 PM

## 2021-01-07 ENCOUNTER — Telehealth: Payer: Self-pay | Admitting: Internal Medicine

## 2021-01-07 DIAGNOSIS — I484 Atypical atrial flutter: Secondary | ICD-10-CM

## 2021-01-07 DIAGNOSIS — I4901 Ventricular fibrillation: Secondary | ICD-10-CM

## 2021-01-07 MED ORDER — METOPROLOL SUCCINATE ER 50 MG PO TB24: 50.0000 mg | ORAL_TABLET | Freq: Two times a day (BID) | ORAL | 3 refills | Status: DC

## 2021-01-07 NOTE — Telephone Encounter (Signed)
Pt's medication was sent to pt's pharmacy as requested. Confirmation received.  °

## 2021-01-07 NOTE — Telephone Encounter (Signed)
*  STAT* If patient is at the pharmacy, call can be transferred to refill team.   1. Which medications need to be refilled? (please list name of each medication and dose if known) metoprolol succinate (TOPROL-XL) 50 MG 24 hr tablet  2. Which pharmacy/location (including street and city if local pharmacy) is medication to be sent to? [id number] [address] - [place], [place] - [address]  3. Do they need a 30 day or 90 day supply? 90 day supply

## 2021-01-07 NOTE — Telephone Encounter (Signed)
Pt is calling back in regards to getting a referral to a Hydrologist at [facility]. Pt states she spoke with Dr. Ladona Ridgel during her last appointment 12/05/20

## 2021-01-09 NOTE — Telephone Encounter (Signed)
Returned call to Pt.  Advised that Dr. Ladona Ridgel would recommend Dr. Rolly Salter at [facility] clinic for further evaluation.  Pt would like referral.  She will also look for physician at [facility] that had been recommended for her.  Pt will call back with [facility] physician information.

## 2021-01-10 NOTE — Telephone Encounter (Signed)
Outreach made to [facility] clinic to place referral x 2.  Provided [employer] and [employer].  Cigna not accepted by [facility] and BCBS said Pt not eligible for coverage.  Call placed to Pt.  Per Pt she just recently acquired new insurance with [employer] ID# [id number].  Returned call to [facility] referral line.  [employer] accepted.  Referral in process.  Fax # [phone] Referral line # [phone]  Patient phone line to [facility]- [phone]  This nurse will fax records to [facility]-attn:  Dr. Rolly Salter.

## 2021-01-10 NOTE — Telephone Encounter (Signed)
Patient is following up. She states she did her own research and she would like to be referred to Dr. Rolly Salter. She requested a call back from [place], [place] to discuss.

## 2021-01-11 NOTE — Telephone Encounter (Signed)
Records faxed.

## 2021-01-17 ENCOUNTER — Telehealth: Payer: Self-pay | Admitting: Internal Medicine

## 2021-01-17 NOTE — Telephone Encounter (Signed)
Spoke with pt who reports she is being scheduled by [facility] clinic for "micro channeling" of her heart and is asking if it is necessary for her to have stress test that is scheduled here at [facility] office.  Pt advised Boneta Lucks, RN and Dr Ladona Ridgel are out of the office this week but will forward question to be reviewed and answered once they have returned to the office.  Pt verbalizes understanding and agrees with current plan.

## 2021-01-17 NOTE — Telephone Encounter (Signed)
New message:    Patient calling stating that she would like to speak with the nurse concering her up coming apt.

## 2021-01-23 NOTE — Telephone Encounter (Signed)
Returned call to Pt.   Advised per DR. Taylor-ok to cancel stress test for now d/t Pt referred to [facility] for further work up with channelopathy specialist.  Will reschedule appt with DR. Ladona Ridgel for end of September after consult at [facility].  Please reschedule.

## 2021-01-28 ENCOUNTER — Telehealth: Payer: Self-pay | Admitting: Internal Medicine

## 2021-01-28 NOTE — Telephone Encounter (Signed)
     1. Has your device fired?   2. Is you device beeping?   3. Are you experiencing draining or swelling at device site?   4. Are you calling to see if we received your device transmission?   5. Have you passed out?   Pt was living in [place] and her pacer was being monitored by her doctor there, but she came back and seeing Dr. Ladona Ridgel now, she wanted to know how can she transferred the pacermaker transmission to [place]. Her [place] doctor called and said they received a transmission from her yesterday   Please route to Device Clinic Pool

## 2021-01-29 NOTE — Telephone Encounter (Signed)
Pt been enrolled in our Carelink since 12/12/2020.

## 2021-01-31 NOTE — Telephone Encounter (Signed)
Spoke with patient, advised she is enrolled in our carelink system.  Her next remote transmission is due on 03/26/21.

## 2021-02-04 ENCOUNTER — Telehealth: Payer: Self-pay | Admitting: Cardiology

## 2021-02-04 NOTE — Telephone Encounter (Signed)
Pt called she has been in atrial fib since 3 pm after swimming.  She has resumed her Eliquis today and she will take an extra half toprol today.  If she feels any worse to go to ER, she is planning on going tomorrow.  She is in [place].   She has ICD and has hx of VT and prolonged Qtc, did discuss extra BB with Dr. Tenny Craw DOD today.   Will send to Dr. Ladona Ridgel.  Pt is to go to [facility] clinic in August or Sept.

## 2021-02-05 ENCOUNTER — Telehealth: Payer: Self-pay

## 2021-02-05 NOTE — Telephone Encounter (Signed)
Patient calling back. She states she sent a transmission while in afib around 7 pm last night. She states she was in afib last night until about midnight. She states she sent another transmission around 7:45 am this morning when she woke up.

## 2021-02-05 NOTE — Telephone Encounter (Signed)
Carelink alert received today for "TriageHFT (HF Risk) Alert: High (Ongoing) AT/AF >= 6 hr for 1 days.  (1) AF episode lasting 07:48:21. ATP x 1 unsuccessful. +OAC Presenting rhythm AP/VS. Avg. Ventricular Rate >= 100 bpm during AT/AF (>= 6 hr) for 1 days. Possible OptiVol fluid accumulation: 07-Dec-2020 -- ongoing"  Successful telephone encounter to [name] to evaluate 7 hour episode of AF with ATP therapy x1. Patient states she was aware of event, called on-call provider who told her to take an extra dose of metoprolol. Patient states she awoke at [place] last night and her HR had returned to "normal". Presenting rythem this morning AP/VS rate of 50. Patient concerned that this is the second time she was participating in a moderate amt of exercise and went into AF. Optivol also elevated however appears to be trending back towards baseline with impedance increasing. Reviewed effects of increased sodium intake on fluid retention. Patient appreciative of education. Informed patient that note would be routed to Dr. Ladona Ridgel for review.

## 2021-02-21 ENCOUNTER — Encounter (HOSPITAL_COMMUNITY): Payer: Managed Care, Other (non HMO)

## 2021-02-22 ENCOUNTER — Encounter: Payer: Managed Care, Other (non HMO) | Admitting: Internal Medicine

## 2021-03-26 ENCOUNTER — Ambulatory Visit (INDEPENDENT_AMBULATORY_CARE_PROVIDER_SITE_OTHER): Payer: 59

## 2021-03-26 DIAGNOSIS — I4901 Ventricular fibrillation: Secondary | ICD-10-CM | POA: Diagnosis not present

## 2021-03-28 LAB — CUP PACEART REMOTE DEVICE CHECK
Battery Remaining Longevity: 30 mo
Battery Voltage: 2.92 V
Brady Statistic AP VP Percent: 0.04 %
Brady Statistic AP VS Percent: 70.8 %
Brady Statistic AS VP Percent: 0.01 %
Brady Statistic AS VS Percent: 29.15 %
Brady Statistic RA Percent Paced: 70.82 %
Brady Statistic RV Percent Paced: 0.05 %
Date Time Interrogation Session: 20220824185617
HighPow Impedance: 49 Ohm
HighPow Impedance: 75 Ohm
Implantable Lead Implant Date: 20090605
Implantable Lead Implant Date: 20150417
Implantable Lead Location: 753859
Implantable Lead Location: 753860
Implantable Lead Model: 5076
Implantable Lead Model: 7120
Implantable Pulse Generator Implant Date: 20150417
Lead Channel Impedance Value: 342 Ohm
Lead Channel Impedance Value: 456 Ohm
Lead Channel Impedance Value: 513 Ohm
Lead Channel Pacing Threshold Amplitude: 0.625 V
Lead Channel Pacing Threshold Amplitude: 0.875 V
Lead Channel Pacing Threshold Pulse Width: 0.4 ms
Lead Channel Pacing Threshold Pulse Width: 0.4 ms
Lead Channel Sensing Intrinsic Amplitude: 2.375 mV
Lead Channel Sensing Intrinsic Amplitude: 2.375 mV
Lead Channel Sensing Intrinsic Amplitude: 4.125 mV
Lead Channel Sensing Intrinsic Amplitude: 4.125 mV
Lead Channel Setting Pacing Amplitude: 2 V
Lead Channel Setting Pacing Amplitude: 2.5 V
Lead Channel Setting Pacing Pulse Width: 0.4 ms
Lead Channel Setting Sensing Sensitivity: 0.3 mV

## 2021-04-10 NOTE — Progress Notes (Signed)
Remote ICD transmission.   

## 2021-04-15 ENCOUNTER — Other Ambulatory Visit: Payer: Self-pay

## 2021-04-15 MED ORDER — MEXILETINE HCL 150 MG PO CAPS: 150.0000 mg | ORAL_CAPSULE | Freq: Two times a day (BID) | ORAL | 2 refills | Status: DC

## 2021-04-15 MED ORDER — POTASSIUM CHLORIDE CRYS ER 20 MEQ PO TBCR: 40.0000 meq | EXTENDED_RELEASE_TABLET | Freq: Two times a day (BID) | ORAL | 2 refills | Status: DC

## 2021-04-15 NOTE — Telephone Encounter (Signed)
Pt's medications were sent to pt's pharmacy as requested. Confirmation received.  

## 2021-04-26 ENCOUNTER — Encounter: Payer: 59 | Admitting: Internal Medicine

## 2021-05-26 IMAGING — DX DG CHEST 2V
2 series · 2 of 2 positions shown · non-contrast
Comparison: Chest radiograph dated 11/18/2013

CLINICAL DATA: 55-year-old female with atrial fibrillation

EXAM:
CHEST - 2 VIEW

[w chest pa]
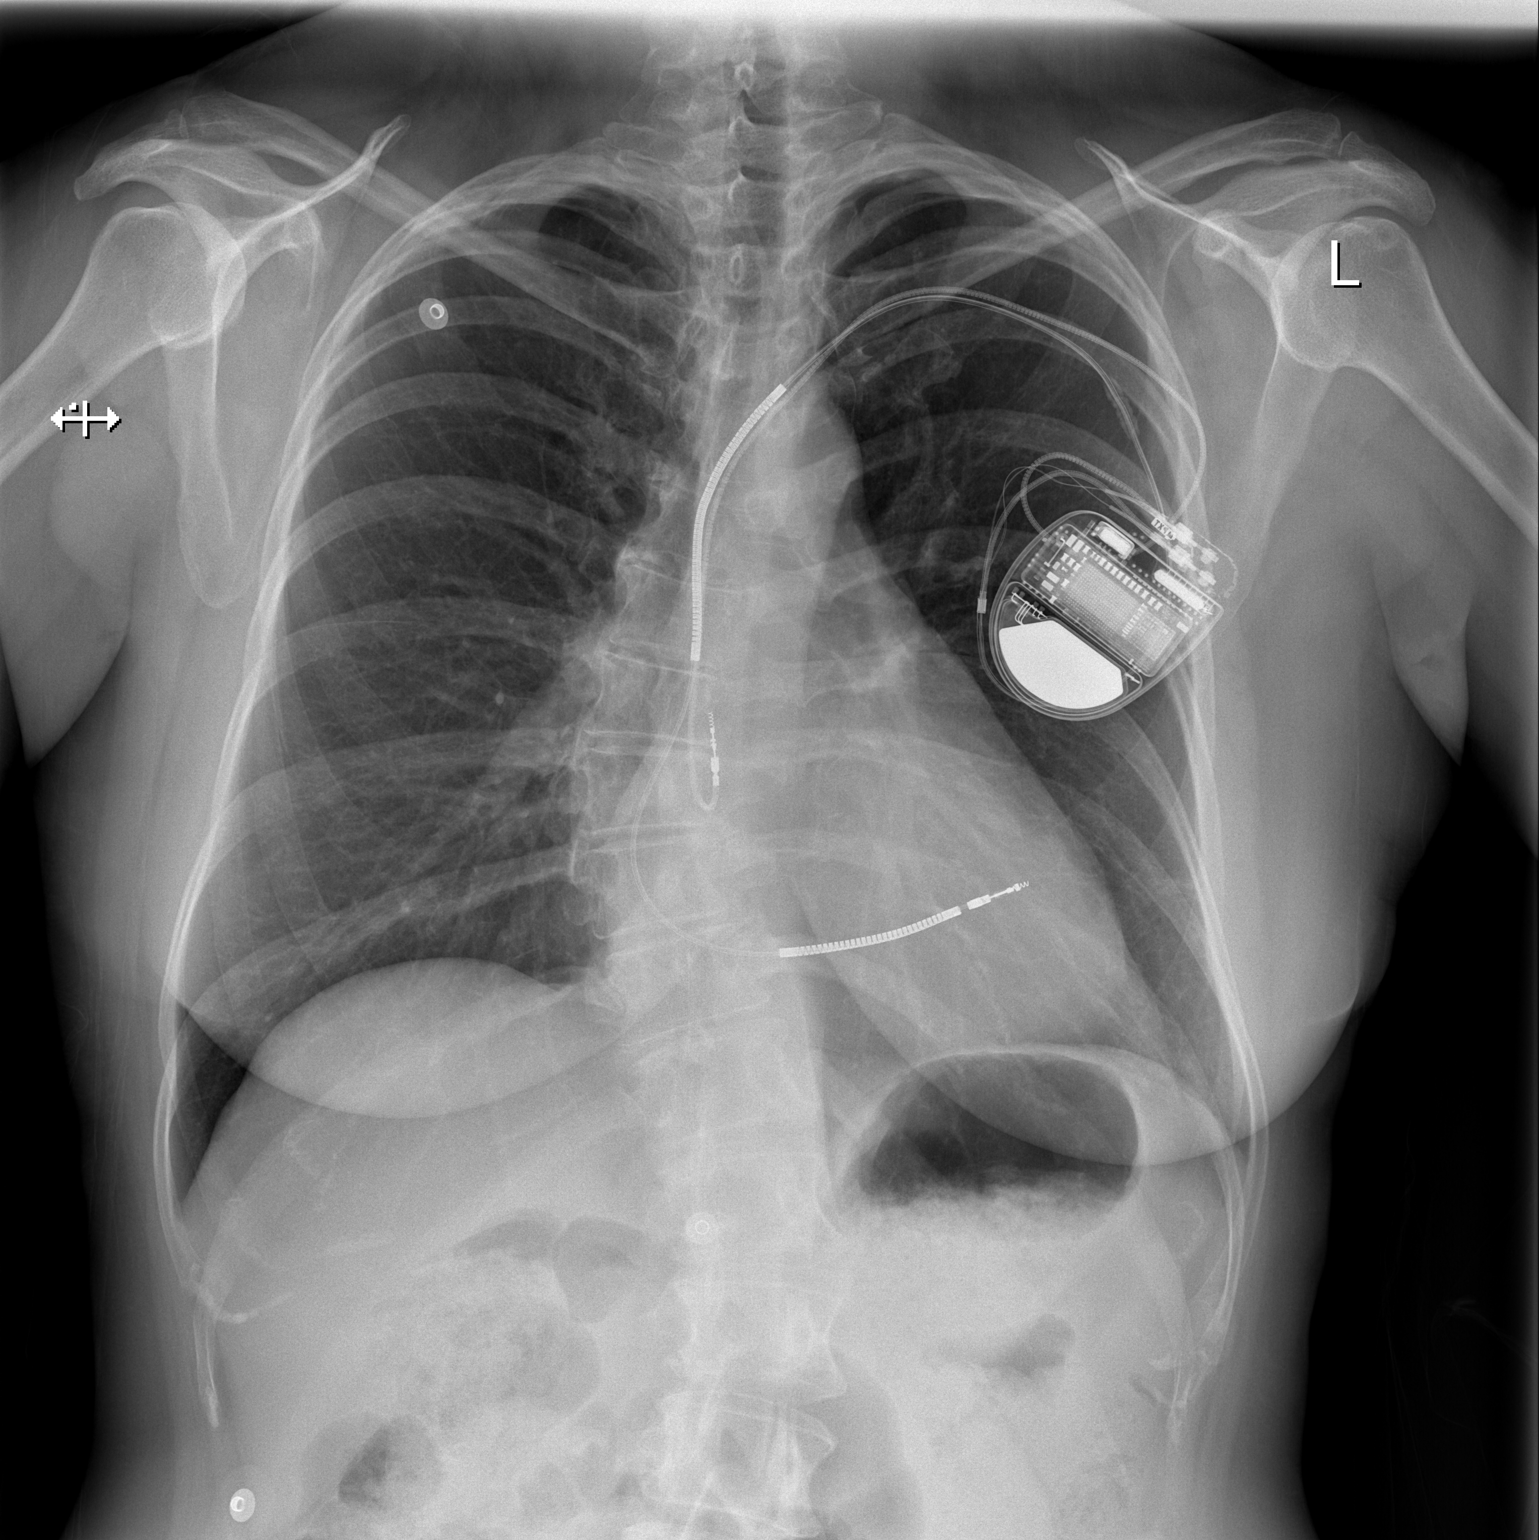

[w chest lat]
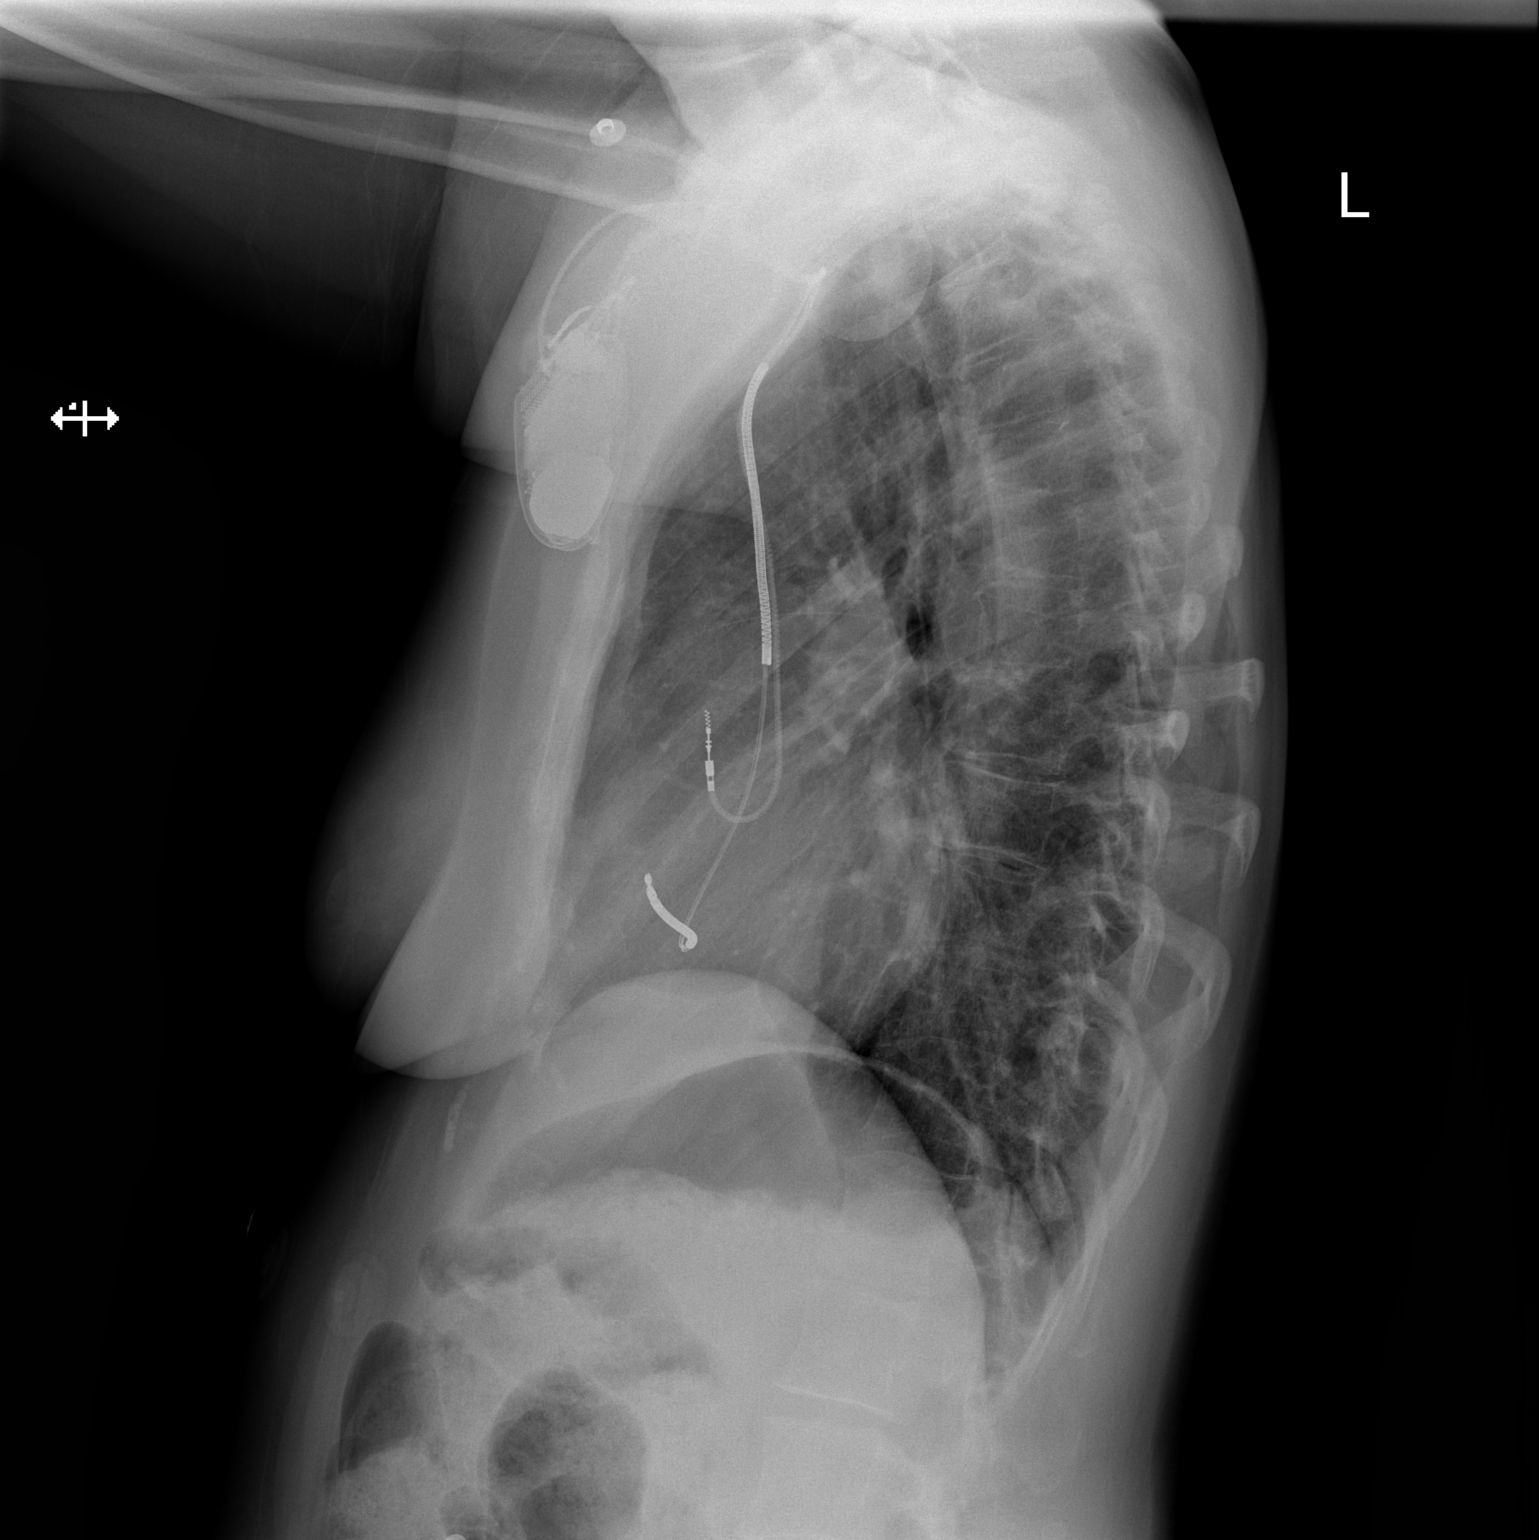

[2 of 2 positions shown; findings below may reference images not displayed]

FINDINGS: No focal consolidation, pleural effusion, pneumothorax. The cardiac
silhouette is within limits. Left pectoral AICD device. No acute
osseous pathology.
IMPRESSION: No active cardiopulmonary disease.

## 2021-06-25 ENCOUNTER — Ambulatory Visit (INDEPENDENT_AMBULATORY_CARE_PROVIDER_SITE_OTHER): Payer: 59

## 2021-06-25 DIAGNOSIS — I4901 Ventricular fibrillation: Secondary | ICD-10-CM

## 2021-06-26 ENCOUNTER — Telehealth: Payer: Self-pay

## 2021-06-26 NOTE — Telephone Encounter (Signed)
The patient is having issues with her monitor. She called [employer] and they are having issues with their wifi. She is going to call  [employer] back on Friday or Monday to get help with her monitor.

## 2021-06-29 LAB — CUP PACEART REMOTE DEVICE CHECK
Battery Remaining Longevity: 25 mo
Battery Voltage: 2.92 V
Brady Statistic AP VP Percent: 0.04 %
Brady Statistic AP VS Percent: 76.53 %
Brady Statistic AS VP Percent: 0.02 %
Brady Statistic AS VS Percent: 23.42 %
Brady Statistic RA Percent Paced: 76.47 %
Brady Statistic RV Percent Paced: 0.05 %
Date Time Interrogation Session: 20221124035007
HighPow Impedance: 45 Ohm
HighPow Impedance: 61 Ohm
Implantable Lead Implant Date: 20090605
Implantable Lead Implant Date: 20150417
Implantable Lead Location: 753859
Implantable Lead Location: 753860
Implantable Lead Model: 5076
Implantable Lead Model: 7120
Implantable Pulse Generator Implant Date: 20150417
Lead Channel Impedance Value: 285 Ohm
Lead Channel Impedance Value: 399 Ohm
Lead Channel Impedance Value: 418 Ohm
Lead Channel Pacing Threshold Amplitude: 0.5 V
Lead Channel Pacing Threshold Amplitude: 0.75 V
Lead Channel Pacing Threshold Pulse Width: 0.4 ms
Lead Channel Pacing Threshold Pulse Width: 0.4 ms
Lead Channel Sensing Intrinsic Amplitude: 2 mV
Lead Channel Sensing Intrinsic Amplitude: 2 mV
Lead Channel Sensing Intrinsic Amplitude: 3.875 mV
Lead Channel Sensing Intrinsic Amplitude: 3.875 mV
Lead Channel Setting Pacing Amplitude: 2 V
Lead Channel Setting Pacing Amplitude: 2.5 V
Lead Channel Setting Pacing Pulse Width: 0.4 ms
Lead Channel Setting Sensing Sensitivity: 0.3 mV

## 2021-07-01 ENCOUNTER — Telehealth: Payer: Self-pay | Admitting: Internal Medicine

## 2021-07-01 ENCOUNTER — Other Ambulatory Visit: Payer: Self-pay

## 2021-07-01 MED ORDER — POTASSIUM CHLORIDE CRYS ER 20 MEQ PO TBCR: 40.0000 meq | EXTENDED_RELEASE_TABLET | Freq: Two times a day (BID) | ORAL | 3 refills | Status: DC

## 2021-07-01 NOTE — Telephone Encounter (Signed)
Refills for Potassium 2 tablets by mouth two times a day sent to pt's preferred pharmacy.

## 2021-07-01 NOTE — Telephone Encounter (Signed)
*  STAT* If patient is at the pharmacy, call can be transferred to refill team.   1. Which medications need to be refilled? (please list name of each medication and dose if known) Potassium Chloride  2. Which pharmacy/location (including street and city if local pharmacy) is medication to be sent to? [facility] [place] ,[place]  3. Do they need a 30 day or 90 day supply? 90 days and refills

## 2021-07-04 NOTE — Progress Notes (Signed)
Remote ICD transmission.   

## 2021-08-21 ENCOUNTER — Telehealth: Payer: Self-pay

## 2021-08-21 NOTE — Telephone Encounter (Signed)
The patient states she had some weird feeling in her chest. She states her HR was high. She states she could feel her heart was beating hard. She also had some discomfort not pain but discomfort. She sent a transmission and would like for the nurse to review it. Her phone number is [phone].

## 2021-08-21 NOTE — Telephone Encounter (Signed)
Remote transmission reviewed. Normal device function. No events noted.   Patient reports of chest discomfort several days ago and still having pain today. Patient lives in [place] but is here in [place] now visiting.  Advised patient her transmission looks good today but it does not allow [place] to see if anything else could be going on with her heart that's not electrical.  Advised patient considering chest discomfort for 3 days and still have discomfort she needs to go to the ED for evaluation.  Patient verbalized understanding. Advised if she has further questions or concerns to please call. Appreciative of follow up call.

## 2021-08-27 ENCOUNTER — Telehealth: Payer: Self-pay | Admitting: Internal Medicine

## 2021-08-27 NOTE — Telephone Encounter (Signed)
Patient c/o Palpitations:  High priority if patient c/o lightheadedness, shortness of breath, or chest pain  How long have you had palpitations/irregular HR/ Afib? Are you having the symptoms now? Since the weekend of and on. Yes   Are you currently experiencing lightheadedness, SOB or CP? no  Do you have a history of afib (atrial fibrillation) or irregular heart rhythm? no  Have you checked your BP or HR? (document readings if available):   Are you experiencing any other symptoms?  no

## 2021-08-27 NOTE — Telephone Encounter (Signed)
Pt has 2 questions.  Should she take Eliquis every day since she is having more afib? Should she take more Toprol XL on a scheduled basis to avoid going into afib?  Answers:  Per Dr. Drucilla Schmidt score is 1-2.  Not high enough to take a daily scheduled dose of blood thinner.  Ok to take when she goes into afib.  Cannot increase Toprol because Pt's blood pressure runs low.  Pt advised and indicates understanding.

## 2021-09-10 ENCOUNTER — Telehealth: Payer: Self-pay | Admitting: Internal Medicine

## 2021-09-10 NOTE — Telephone Encounter (Signed)
Patient c/o Palpitations:  High priority if patient c/o lightheadedness, shortness of breath, or chest pain  How long have you had palpitations/irregular HR/ Afib? Are you having the symptoms now? yes  Are you currently experiencing lightheadedness, SOB or CP? Lightheadedness   Do you have a history of afib (atrial fibrillation) or irregular heart rhythm? yes  Have you checked your BP or HR? (document readings if available): HR 115  Are you experiencing any other symptoms? no

## 2021-09-10 NOTE — Telephone Encounter (Signed)
Spoke with patient.  Advised manual transmission received.  Pt is currently in AF.  Which she is aware of.  Answered questions regarding HR.  Advised device is programmed with LRL of 50bpm, rate response is on however so it may be possible for HR to drop if she is inactive, especially if sleep rate is on which it typically is with rate response.  Rate histograms do show rate going into 40s.

## 2021-09-10 NOTE — Telephone Encounter (Signed)
Feeling afib all the time when it happens.  Its like switch is flipped.  She has forwarded transmission of device about one hour ago.  Following HR on watch.  50s-115 today.  She wants to make sure that it is not anything she needs to be concerned about.  Knows she can take an extra metoprolol when she goes into afib.  Took about 30 min ago but it has not helped yet.  No blood pressure reading at this time.    No SOB, no chest pains.  Got little lightheaded when she stood a little bit ago.    One other thing, she thought her device was set at low rate of 60 but at night she is getting readings into 40s when she is sleeping.  They adjusted her settings when she went to [facility].

## 2021-09-24 ENCOUNTER — Ambulatory Visit (INDEPENDENT_AMBULATORY_CARE_PROVIDER_SITE_OTHER): Payer: 59

## 2021-09-24 DIAGNOSIS — I4901 Ventricular fibrillation: Secondary | ICD-10-CM

## 2021-09-24 LAB — CUP PACEART REMOTE DEVICE CHECK
Battery Remaining Longevity: 24 mo
Battery Voltage: 2.92 V
Brady Statistic AP VP Percent: 0.09 %
Brady Statistic AP VS Percent: 93.76 %
Brady Statistic AS VP Percent: 0.04 %
Brady Statistic AS VS Percent: 6.11 %
Brady Statistic RA Percent Paced: 92.92 %
Brady Statistic RV Percent Paced: 0.14 %
Date Time Interrogation Session: 20230221033424
HighPow Impedance: 46 Ohm
HighPow Impedance: 62 Ohm
Implantable Lead Implant Date: 20090605
Implantable Lead Implant Date: 20150417
Implantable Lead Location: 753859
Implantable Lead Location: 753860
Implantable Lead Model: 5076
Implantable Lead Model: 7120
Implantable Pulse Generator Implant Date: 20150417
Lead Channel Impedance Value: 342 Ohm
Lead Channel Impedance Value: 399 Ohm
Lead Channel Impedance Value: 418 Ohm
Lead Channel Pacing Threshold Amplitude: 0.5 V
Lead Channel Pacing Threshold Amplitude: 0.875 V
Lead Channel Pacing Threshold Pulse Width: 0.4 ms
Lead Channel Pacing Threshold Pulse Width: 0.4 ms
Lead Channel Sensing Intrinsic Amplitude: 2.75 mV
Lead Channel Sensing Intrinsic Amplitude: 2.75 mV
Lead Channel Sensing Intrinsic Amplitude: 3.5 mV
Lead Channel Sensing Intrinsic Amplitude: 3.5 mV
Lead Channel Setting Pacing Amplitude: 2 V
Lead Channel Setting Pacing Amplitude: 2.5 V
Lead Channel Setting Pacing Pulse Width: 0.4 ms
Lead Channel Setting Sensing Sensitivity: 0.3 mV

## 2021-10-01 NOTE — Progress Notes (Signed)
Remote ICD transmission.   

## 2021-11-15 ENCOUNTER — Telehealth: Payer: Self-pay | Admitting: Internal Medicine

## 2021-11-15 NOTE — Telephone Encounter (Signed)
New Message: ? ? ? ?Patient says she would like to talk to Dr Lubertha Basque Nurse please. She says she wants to know if Dr Ladona Ridgel thinks it will be alright for her to have Cosmetic Surgery? ?

## 2021-11-19 NOTE — Telephone Encounter (Signed)
Returned call to Pt. ? ?Advised per Dr. Junie Spencer to proceed with breast implants and/or inner thigh liposuction. ? ?Advised that her surgeon will probably want to send a cardiac clearance to office for review. ? ?Pt indicates understanding. ?

## 2021-12-20 ENCOUNTER — Encounter: Payer: Self-pay | Admitting: Internal Medicine

## 2021-12-20 ENCOUNTER — Ambulatory Visit: Payer: 59 | Admitting: Internal Medicine

## 2021-12-20 VITALS — BP 132/70 | HR 59 | Ht 64.0 in | Wt 128.0 lb

## 2021-12-20 DIAGNOSIS — I469 Cardiac arrest, cause unspecified: Secondary | ICD-10-CM | POA: Diagnosis not present

## 2021-12-20 DIAGNOSIS — I480 Paroxysmal atrial fibrillation: Secondary | ICD-10-CM

## 2021-12-20 DIAGNOSIS — Z95810 Presence of automatic (implantable) cardiac defibrillator: Secondary | ICD-10-CM | POA: Diagnosis not present

## 2021-12-20 MED ORDER — APIXABAN 5 MG PO TABS: ORAL_TABLET | ORAL | 1 refills | Status: AC

## 2021-12-20 NOTE — Patient Instructions (Addendum)
Medication Instructions:  Your physician has recommended you make the following change in your medication:    Take Eliquis 5 mg -  Take one tablet by mouth twice a day as needed for atrial fibrillation lasting longer than 3 hours.  Labwork: None ordered.  Testing/Procedures: None ordered.  Follow-Up: Your physician wants you to follow-up in: one year with Cristopher Peru, MD  You will receive a reminder letter in the mail two months in advance. If you don't receive a letter, please call our office to schedule the follow-up appointment.  Remote monitoring is used to monitor your ICD from home. This monitoring reduces the number of office visits required to check your device to one time per year. It allows [place] to keep an eye on the functioning of your device to ensure it is working properly. You are scheduled for a device check from home on 12/24/2021. You may send your transmission at any time that day. If you have a wireless device, the transmission will be sent automatically. After your physician reviews your transmission, you will receive a postcard with your next transmission date.  Any Other Special Instructions Will Be Listed Below (If Applicable).  If you need a refill on your cardiac medications before your next appointment, please call your pharmacy.   Important Information About Sugar

## 2021-12-20 NOTE — Progress Notes (Signed)
HPI [name] returns today for followup. She is a pleasant middle aged woman with a h/o VF arrest, s/p ICD insertion, with workup at [facility] as well as at [facility] clinic, unrevealing. She also has sinus node dysfunction. She has been placed on mexilitine and has not had any additional episodes of VF. The last episode was over 10 years ago when her cat was attacked by a dog. She has developed PAF, but lately been well controlled and has not had an ablation. She is pending plastic surgery as well as eye surgery.   No Known Allergies   Current Outpatient Medications  Medication Sig Dispense Refill   acetaminophen (TYLENOL) 500 MG tablet Take 1,000 mg by mouth every 6 (six) hours as needed for mild pain or headache.     levonorgestrel (MIRENA, 52 MG,) 20 MCG/24HR IUD 1 Intra Uterine Device by Intrauterine route.     metoprolol succinate (TOPROL-XL) 50 MG 24 hr tablet Take 1 tablet (50 mg total) by mouth 2 (two) times daily. Take with or immediately following a meal. 180 tablet 3   mexiletine (MEXITIL) 150 MG capsule Take 1 capsule (150 mg total) by mouth 2 (two) times daily. 180 capsule 2   OVER THE COUNTER MEDICATION Place 1 drop into both eyes daily as needed (dry/ irritated  eyes).     potassium chloride SA (KLOR-CON) 20 MEQ tablet Take 2 tablets (40 mEq total) by mouth 2 (two) times daily. 360 tablet 3   No current facility-administered medications for this visit.     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   Past Surgical History:  Procedure Laterality Date   ADENOIDECTOMY     BILATERAL  SALPINGECTOMY Bilateral 02/13/2015   Procedure: BILATERAL SALPINGECTOMY;  Surgeon: Ok Edwards, MD;  Location: [facility] ORS;  Service: Gynecology;  Laterality: Bilateral;   CARDIOVERSION N/A 11/30/2020   Procedure: CARDIOVERSION;  Surgeon: Little Ishikawa, MD;  Location: [facility] ENDOSCOPY;  Service: Cardiovascular;  Laterality: N/A;   CESAREAN SECTION  1994   DILATION AND CURETTAGE OF UTERUS  1999   IMPLANTABLE CARDIOVERTER DEFIBRILLATOR (ICD) GENERATOR CHANGE N/A 11/18/2013   Procedure: ICD GENERATOR CHANGE;  Surgeon: Marinus Maw, MD;  Location: [facility] CATH LAB;  Service: Cardiovascular;  Laterality: N/A;   IMPLANTABLE CARDIOVERTER DEFIBRILLATOR IMPLANT  2009; 11-18-13   MDT single chamber ICD implanted by Dr Ladona Ridgel 2009; upgrade to dual chamber MDT ICD 11/2013 by Dr Ladona Ridgel   INTRAUTERINE DEVICE INSERTION  February 13, 2011   PARAGUARD    LAPAROSCOPIC BILATERAL SALPINGECTOMY Bilateral 02/13/2015   Procedure: LAPAROSCOPIC BILATERAL SALPINGECTOMY;  Surgeon: Ok Edwards, MD;  Location: [facility];  Service: Gynecology;  Laterality: Bilateral;   OOPHORECTOMY Right 02/13/2015   Procedure: OOPHORECTOMY;  Surgeon: Ok Edwards, MD;  Location: [facility];  Service: Gynecology;  Laterality: Right;   TEE WITHOUT CARDIOVERSION N/A 11/30/2020   Procedure: TRANSESOPHAGEAL ECHOCARDIOGRAM (TEE);  Surgeon: Little Ishikawa, MD;  Location: [facility] ENDOSCOPY;  Service: Cardiovascular;  Laterality: N/A;   TYMPANOSTOMY TUBE PLACEMENT  wisdom teeeth        Family History  Problem Relation Age of Onset   Hypertension Father    Heart disease Father        PACEMAKER INSERTED   Diabetes Maternal Grandmother    Cancer Maternal Grandmother        COLON CANCER     Social History   Socioeconomic History   Marital status: Married    Spouse name: Not on file   Number of children: Not on file   Years of education: Not on file   Highest education level: Not on file  Occupational History   Not on file  Tobacco  Use   Smoking status: Never   Smokeless tobacco: Never  Vaping Use   Vaping Use: Never used  Substance and Sexual Activity   Alcohol use: Yes    Comment: occasionally   Drug use: No   Sexual activity: Yes    Partners: Male    Birth control/protection: I.U.D.    Comment: PARAGARD  Other Topics Concern   Not on file  Social History Narrative   Not on file   Social Determinants of Health   Financial Resource Strain: Not on file  Food Insecurity: Not on file  Transportation Needs: Not on file  Physical Activity: Not on file  Stress: Not on file  Social Connections: Not on file  Intimate Partner Violence: Not on file     BP 132/70   Pulse (!) 59   Ht 5\' 4"  (1.626 m)   Wt 128 lb (58.1 kg)   SpO2 99%   BMI 21.97 kg/m   Physical Exam:  Well appearing 56 yo woman, NAD HEENT: Unremarkable Neck:  No JVD, no thyromegally Lymphatics:  No adenopathy Back:  No CVA tenderness Lungs:  Clear with no wheezes HEART:  Regular rate rhythm, no murmurs, no rubs, no clicks Abd:  soft, positive bowel sounds, no organomegally, no rebound, no guarding Ext:  2 plus pulses, no edema, no cyanosis, no clubbing Skin:  No rashes no nodules Neuro:  CN II through XII intact, motor grossly intact  EKG - NSR with atrial pacing and RBBB and first degree AV block  DEVICE  Normal device function.  See PaceArt for details.   Assess/Plan:  Preop eval - she is low risk for cardiac complications from pending plastic surgery. She can undergo if needed general anesthesia. Avoid any QT prolonging meds. 2. ICD - her ICD is working normally and has over a year of battery longevity. She will need the ICD turned off and back on prior to any surgery involving the thorax if electrocautery used.  3. PAF - as above. She is maintaining NSR and is in rhythm 99.4% of the time. Continue.  4. Sinus node dysfunction - she is asymptomatic s/p DDD ICD with atrial pacing.    [address] Virgil Lightner,MD

## 2021-12-20 NOTE — Progress Notes (Signed)
[facility] DEVICE PROGRAMMING  Patient Information: Name:  [name]  DOB:  1965-08-31  MRN:  [id number]    Breast augmentation Device Information:  Clinic EP Physician:  Cristopher Peru, MD   Device Type:  Pacemaker and Defibrillator Manufacturer and Phone #:  Medtronic: [phone] Pacemaker Dependent?:  No. Date of Last Device Check:  12/20/21 Normal Device Function?:  Yes.    Electrophysiologist's Recommendations:  Have magnet available. Provide continuous ECG monitoring when magnet is used or reprogramming is to be performed.  Procedure will likely interfere with device function.  Device should be programmed:  Tachy therapies disabled  Per Device Clinic Standing Orders, [name], [place]  4:08 PM 12/20/2021

## 2022-01-23 ENCOUNTER — Telehealth: Payer: Self-pay

## 2022-01-23 NOTE — Telephone Encounter (Signed)
   Pre-operative Risk Assessment    Patient Name: [name]  DOB: 1966-05-24 MRN: [id number]      Request for Surgical Clearance    Procedure:   BLL BLEPH W BLL ECO2 (COSM)  Date of Surgery:  Clearance 02/28/22                                 Surgeon: DR. Myrla Halsted Surgeon's Group or Practice Name:  [facility]. Phone number:  [phone] Fax number:  [phone]   Type of Clearance Requested:   - Medical  - Pharmacy:  Hold Apixaban (Eliquis) NEEDS INSTRUCTIONS    Type of Anesthesia:  MAC   Additional requests/questions:    Signed, Jacinta Shoe   01/23/2022, 2:30 PM

## 2022-01-24 ENCOUNTER — Telehealth: Payer: Self-pay | Admitting: Internal Medicine

## 2022-01-24 NOTE — Telephone Encounter (Signed)
Pt stated that she wants to give a heads up to preop nurse that the office doing her procedure on 02/28/22 will be faxing over some documents that need to be reviewed and signed.

## 2022-01-27 NOTE — Telephone Encounter (Signed)
Pt has been cleared by Dr. Ladona Ridgel. I will re-fax the clearance notes to the requesting office. Please see clearance notes for further information if needed.

## 2022-01-31 NOTE — Telephone Encounter (Signed)
OUR OFFICE RECEIVED FAX ASKING FOR EKG WITHIN THE PAST 1 YR IF NORMAL AND THE PAST 30 DAYS IF ABNORMAL  AS WELL AS THE MOST RECENT H&P  THIS WILL NEED TO BE ADDRESSED BY OUR HIM DEPT. I WILL FORWARD THE NOTES OVER THE HIM DEPT TO FAX OVER THE REQUESTED INFORMATION TO REQUESTING PROVIDER FOR PRE OP CLEARANCE.   SURGEON: DR. Terie Purser [facility].  Phone number:  [phone] Fax number:  [phone]

## 2022-02-10 ENCOUNTER — Telehealth: Payer: Self-pay | Admitting: Internal Medicine

## 2022-02-10 MED ORDER — METOPROLOL SUCCINATE ER 50 MG PO TB24: 50.0000 mg | ORAL_TABLET | Freq: Two times a day (BID) | ORAL | 3 refills | Status: DC

## 2022-02-10 NOTE — Telephone Encounter (Signed)
*  STAT* If patient is at the pharmacy, call can be transferred to refill team.   1. Which medications need to be refilled? (please list name of each medication and dose if known)  metoprolol succinate (TOPROL-XL) 50 MG 24 hr tablet  2. Which pharmacy/location (including street and city if local pharmacy) is medication to be sent to? [id number] [address] - [place], [place] - [address]  3. Do they need a 30 day or 90 day supply? 30 day supply

## 2022-02-10 NOTE — Telephone Encounter (Signed)
Pt's medication was sent to pt's pharmacy as requested. Confirmation received.  °

## 2022-02-13 NOTE — Telephone Encounter (Signed)
Pt is calling requesting call back to discuss any updates on this.

## 2022-02-13 NOTE — Telephone Encounter (Signed)
Left a message for the pt that message was sent to HIM Dept to send requested notes.   I will fax notes today to requesting office as to not delay the pt's surgery.

## 2022-03-07 ENCOUNTER — Telehealth: Payer: Self-pay | Admitting: *Deleted

## 2022-03-07 NOTE — Telephone Encounter (Signed)
The records will need to come from our medical record dept. A message was sent this morning to our med rec dept . This may take some time. To come from med rec. If you have further questions for the records you need please contact our medical record dept. [phone] and ask to s/w medical records.

## 2022-03-07 NOTE — Telephone Encounter (Signed)
[facility] called stating they received the clearance form however they are still needing  a copy of EKG, OV NOTE, STRESS TEST (FINAL REPORT ONLY), ECHO (FINAL REPORT ONLY), to the surgeon's office. Fax number [phone]

## 2022-03-07 NOTE — Telephone Encounter (Signed)
   Pre-operative Risk Assessment    Patient Name: [name]  DOB: June 25, 1966 MRN: [id number]      Request for Surgical Clearance    Procedure:   B/L BREAST AUGMENTATION   Date of Surgery:  Clearance 03/18/22                                 Surgeon:  DR. Jewel Baize Surgeon's Group or Practice Name:  [facility] Phone number:  [phone] Fax number:  [phone]   Type of Clearance Requested:   - Medical  - Pharmacy:  Hold Apixaban (Eliquis)     Type of Anesthesia:  General    Additional requests/questions:  Please fax a copy of EKG, OV NOTE, STRESS TEST (FINAL REPORT ONLY), ECHO (FINAL REPORT ONLY), to the surgeon's office. A MESSAGE WILL BE SENT TO OUR HIM DEPT TO [facility] THE REQUESTED RECORDS  Signed, Danielle Rankin   03/07/2022, 8:28 AM

## 2022-03-07 NOTE — Telephone Encounter (Signed)
   Patient Name: [name]  DOB: 05-12-1966 MRN: [id number]  Primary Cardiologist: Lewayne Bunting, MD  Chart reviewed as part of pre-operative protocol coverage. Given past medical history and time since last visit, based on ACC/AHA guidelines, [name] would be at acceptable risk for the planned procedure without further cardiovascular testing.   Preop eval - she is low risk for cardiac complications from pending plastic surgery. She can undergo if needed general anesthesia. Avoid any QT prolonging meds.  I will route this recommendation to the requesting party via Epic fax function and remove from pre-op pool.  CHA2DS2-VASc Score = 2   This indicates a 2.2% annual risk of stroke. The patient's score is based upon: CHF History: 0 HTN History: 0 Diabetes History: 0 Stroke History: 0 Vascular Disease History: 1 Age Score: 0 Gender Score: 1      CrCl 80 mL/min Scr 0.72 on 01/28/22   Per office protocol, patient can hold Eliquis for 2 days prior to procedure.   Patient will not need bridging with Lovenox (enoxaparin) around procedure.   Please call with questions.  Napoleon Form, Leodis Rains, NP 03/07/2022, 12:12 PM

## 2022-03-07 NOTE — Telephone Encounter (Signed)
Patient with diagnosis of atrial fibrillation on Eliquis for anticoagulation.    Procedure: B/L Breast Augmentation  Date of procedure: 03/18/22   CHA2DS2-VASc Score = 2   This indicates a 2.2% annual risk of stroke. The patient's score is based upon: CHF History: 0 HTN History: 0 Diabetes History: 0 Stroke History: 0 Vascular Disease History: 1 Age Score: 0 Gender Score: 1     CrCl 80 mL/min Scr 0.72 on 01/28/22  Per office protocol, patient can hold Eliquis for 2 days prior to procedure.   Patient will not need bridging with Lovenox (enoxaparin) around procedure.  **This guidance is not considered finalized until pre-operative APP has relayed final recommendations.**

## 2022-03-25 ENCOUNTER — Ambulatory Visit (INDEPENDENT_AMBULATORY_CARE_PROVIDER_SITE_OTHER): Payer: 59

## 2022-03-25 DIAGNOSIS — I469 Cardiac arrest, cause unspecified: Secondary | ICD-10-CM | POA: Diagnosis not present

## 2022-03-26 LAB — CUP PACEART REMOTE DEVICE CHECK
Battery Remaining Longevity: 19 mo
Battery Voltage: 2.89 V
Brady Statistic AP VP Percent: 0.03 %
Brady Statistic AP VS Percent: 89.06 %
Brady Statistic AS VP Percent: 0.02 %
Brady Statistic AS VS Percent: 10.9 %
Brady Statistic RA Percent Paced: 89.08 %
Brady Statistic RV Percent Paced: 0.05 %
Date Time Interrogation Session: 20230823124552
HighPow Impedance: 45 Ohm
HighPow Impedance: 62 Ohm
Implantable Lead Implant Date: 20090605
Implantable Lead Implant Date: 20150417
Implantable Lead Location: 753859
Implantable Lead Location: 753860
Implantable Lead Model: 5076
Implantable Lead Model: 7120
Implantable Pulse Generator Implant Date: 20150417
Lead Channel Impedance Value: 304 Ohm
Lead Channel Impedance Value: 399 Ohm
Lead Channel Impedance Value: 456 Ohm
Lead Channel Pacing Threshold Amplitude: 0.5 V
Lead Channel Pacing Threshold Amplitude: 0.75 V
Lead Channel Pacing Threshold Pulse Width: 0.4 ms
Lead Channel Pacing Threshold Pulse Width: 0.4 ms
Lead Channel Sensing Intrinsic Amplitude: 2 mV
Lead Channel Sensing Intrinsic Amplitude: 2 mV
Lead Channel Sensing Intrinsic Amplitude: 3.375 mV
Lead Channel Sensing Intrinsic Amplitude: 3.375 mV
Lead Channel Setting Pacing Amplitude: 2 V
Lead Channel Setting Pacing Amplitude: 2.5 V
Lead Channel Setting Pacing Pulse Width: 0.4 ms
Lead Channel Setting Sensing Sensitivity: 0.3 mV

## 2022-04-16 ENCOUNTER — Other Ambulatory Visit: Payer: Self-pay | Admitting: Internal Medicine

## 2022-04-22 NOTE — Progress Notes (Signed)
Remote ICD transmission.   

## 2022-06-16 ENCOUNTER — Telehealth: Payer: Self-pay

## 2022-06-16 NOTE — Telephone Encounter (Signed)
Patient with diagnosis of atrial fibrillation on Eliquis 5 mg BID for anticoagulation.    Procedure: lower eyelid cosmetic complaint  Date of procedure: 06/20/2022   CHA2DS2-VASc Score = 2  This indicates a 2.2% annual risk of stroke. The patient's score is based upon: CHF History: 0 HTN History: 0 Diabetes History: 0 Stroke History: 0 Vascular Disease History: 1 Age Score: 0 Gender Score: 1      CrCl ~75 ml/min (Cr 0.72 01/28/2022, IBW) Platelet count 208 (01/28/2022)  Per office protocol, patient can hold Eliquis 5mg  BID for 1 to 2 days prior to procedure.    **This guidance is not considered finalized until pre-operative APP has relayed final recommendations.**   , PharmD. [facility] Acute Care PGY-1 06/16/2022 1:37 PM

## 2022-06-16 NOTE — Telephone Encounter (Signed)
..     Pre-operative Risk Assessment    Patient Name: [name]  DOB: March 15, 1966 MRN: [id number]      Request for Surgical Clearance    Procedure:   lower eyelid cosmetic complaint Date of Surgery:  Clearance 06/20/22                                 Surgeon:  Legrand Como neimkin Surgeon's Group or Practice Name:  [facility] Phone number:  [phone] Fax number:  [phone]   Type of Clearance Requested:   Medical Pharmacy/ eliquis   Type of Anesthesia:  MAC   Additional requests/questions:    [name]   06/16/2022, 9:32 AM

## 2022-06-17 ENCOUNTER — Ambulatory Visit: Payer: 59 | Attending: Physician Assistant | Admitting: Physician Assistant

## 2022-06-17 ENCOUNTER — Telehealth: Payer: Self-pay | Admitting: *Deleted

## 2022-06-17 DIAGNOSIS — Z01810 Encounter for preprocedural cardiovascular examination: Secondary | ICD-10-CM | POA: Diagnosis not present

## 2022-06-17 NOTE — Telephone Encounter (Signed)
   Name: [name]  DOB: 03/05/1966  MRN: [id number]  Primary Cardiologist: Lewayne Bunting, MD  Chart reviewed as part of pre-operative protocol coverage. Because of Devany Aja Dubiel's past medical history and time since last visit, she will require a follow-up telephone visit in order to better assess preoperative cardiovascular risk.  Pre-op covering staff: - Please schedule appointment and call patient to inform them. If patient already had an upcoming appointment within acceptable timeframe, please add "pre-op clearance" to the appointment notes so provider is aware. - Please contact requesting surgeon's office via preferred method (i.e, phone, fax) to inform them of need for appointment prior to surgery.  Per office protocol, patient can hold Eliquis 5mg  BID for 1 to 2 days prior to procedure.     , PA-C  06/17/2022, 11:24 AM

## 2022-06-17 NOTE — Telephone Encounter (Signed)
I s/w the pt and informed her per the pre op provider to add on to pre op schedule for today. Pt agreeable to plan of care. Pt added on 06/17/22 @ 3:40. Med rec and consent are done.

## 2022-06-17 NOTE — Progress Notes (Signed)
Virtual Visit via Telephone Note   Because of [name]'s co-morbid illnesses, she is at least at moderate risk for complications without adequate follow up.  This format is felt to be most appropriate for this patient at this time.  The patient did not have access to video technology/had technical difficulties with video requiring transitioning to audio format only (telephone).  All issues noted in this document were discussed and addressed.  No physical exam could be performed with this format.  Please refer to the patient's chart for her consent to telehealth for [facility].  Evaluation Performed:  Preoperative cardiovascular risk assessment _____________   Date:  06/17/2022   Patient ID:  [name], DOB [birth year], MRN [id number] Patient Location:  Home Provider location:   Office  Primary Care Provider:  Patient, No Pcp Per Primary Cardiologist:  Tracy Bunting, MD  Chief Complaint / Patient Profile   56 y.o. y/o female with a h/o Atrial flutter, AICD, MI, ventricular fibrillation who is pending lower eyelid cosmetic surgery and presents today for telephonic preoperative cardiovascular risk assessment.  Past Medical History    Past Medical History:  Diagnosis Date   AICD (automatic cardioverter/defibrillator) present    Atrial flutter (HCC)    IUD (intrauterine device) in place 2016   Long Q-T syndrome    Myocardial infarction [facility])    due to long QT   Presence of permanent cardiac pacemaker    Symptomatic bradycardia    Ventricular fibrillation [facility])    Past Surgical History:  Procedure Laterality Date   ADENOIDECTOMY     BILATERAL SALPINGECTOMY Bilateral 02/13/2015   Procedure: BILATERAL SALPINGECTOMY;  Surgeon: Ok Edwards, MD;  Location: [facility] ORS;  Service: Gynecology;  Laterality: Bilateral;   CARDIOVERSION N/A 11/30/2020   Procedure: CARDIOVERSION;  Surgeon: Little Ishikawa, MD;  Location: [facility] ENDOSCOPY;  Service: Cardiovascular;   Laterality: N/A;   CESAREAN SECTION  1994   DILATION AND CURETTAGE OF UTERUS  1999   IMPLANTABLE CARDIOVERTER DEFIBRILLATOR (ICD) GENERATOR CHANGE N/A 11/18/2013   Procedure: ICD GENERATOR CHANGE;  Surgeon: Marinus Maw, MD;  Location: [facility] CATH LAB;  Service: Cardiovascular;  Laterality: N/A;   IMPLANTABLE CARDIOVERTER DEFIBRILLATOR IMPLANT  2009; 11-18-13   MDT single chamber ICD implanted by Dr Ladona Ridgel 2009; upgrade to dual chamber MDT ICD 11/2013 by Dr Ladona Ridgel   INTRAUTERINE DEVICE INSERTION  February 13, 2011   PARAGUARD    LAPAROSCOPIC BILATERAL SALPINGECTOMY Bilateral 02/13/2015   Procedure: LAPAROSCOPIC BILATERAL SALPINGECTOMY;  Surgeon: Ok Edwards, MD;  Location: [facility] ORS;  Service: Gynecology;  Laterality: Bilateral;   OOPHORECTOMY Right 02/13/2015   Procedure: OOPHORECTOMY;  Surgeon: Ok Edwards, MD;  Location: [facility] ORS;  Service: Gynecology;  Laterality: Right;   TEE WITHOUT CARDIOVERSION N/A 11/30/2020   Procedure: TRANSESOPHAGEAL ECHOCARDIOGRAM (TEE);  Surgeon: Little Ishikawa, MD;  Location: [facility] ENDOSCOPY;  Service: Cardiovascular;  Laterality: N/A;   TYMPANOSTOMY TUBE PLACEMENT     wisdom teeeth       Allergies  No Known Allergies  History of Present Illness    [name] is a 56 y.o. female who presents via audio/video conferencing for a telehealth visit today.  Pt was last seen in cardiology clinic on 12/20/2021 by Dr. Ladona Ridgel.  At that time [name] was doing well.  The patient is now pending procedure as outlined above. Since her last visit, she shares with me that she is not taking her Eliquis on a daily basis.  In fact, she has not taken it since January 2023.  She only takes it when she feels that she is in atrial fibrillation.  She has not been in atrial fibrillation since then.  She has a watch that notifies her and she is also very symptomatic when she is having atrial fibrillation.  She has not had any chest pain or shortness of breath.  She does  have an ICD in place and there is a plan to place a magnet over it and then turn it back on after procedure.  She will have it interrogated at that time as well.  She is having her surgery done inpatient to have the resources that she needs close by.  She had a breast augmentation 3 months ago and did really well with that surgery.  She was doing a weight training program before her surgery but was told to hold off until the new year before she gets back into that regimen.  She shares with me that her and her husband walk 1.5 miles every night.  She also does some yard work like [employer].  Her medical history does include a full cardiac arrest back in 2009 and Dr. Ladona Ridgel saved her life.  Even though she moved to [place] she still continues to see him once a year and has a great relationship with him.  She scored a 6.05 METS on the DASI.  This exceeds the minimum 4 METS requirement.  She is no longer taking Eliquis and only takes as needed.  Home Medications    Prior to Admission medications   Medication Sig Start Date End Date Taking? Authorizing Provider  acetaminophen (TYLENOL) 500 MG tablet Take 1,000 mg by mouth every 6 (six) hours as needed for mild pain or headache.    [provider]  apixaban (ELIQUIS) 5 MG TABS tablet Take one tablet by mouth twice a day as needed for atrial fibrillation lasting longer than 3 hours. 12/20/21   Marinus Maw, MD  levonorgestrel (MIRENA, 52 MG,) 20 MCG/24HR IUD 1 Intra Uterine Device by Intrauterine route.    [provider]  metoprolol succinate (TOPROL-XL) 50 MG 24 hr tablet Take 1 tablet (50 mg total) by mouth 2 (two) times daily. Take with or immediately following a meal. 02/10/22   Marinus Maw, MD  mexiletine (MEXITIL) 150 MG capsule TAKE ONE CAPSULE BY MOUTH TWICE A DAY 04/17/22   Marinus Maw, MD  OVER THE COUNTER MEDICATION Place 1 drop into both eyes daily as needed (dry/ irritated  eyes).    [provider]   potassium chloride SA (KLOR-CON) 20 MEQ tablet Take 2 tablets (40 mEq total) by mouth 2 (two) times daily. 07/01/21   Marinus Maw, MD    Physical Exam    Vital Signs:  [name] does not have vital signs available for review today.  Given telephonic nature of communication, physical exam is limited. AAOx3. NAD. Normal affect.  Speech and respirations are unlabored.  Accessory Clinical Findings    None  Assessment & Plan    1.  Preoperative Cardiovascular Risk Assessment:  [name]'s perioperative risk of a major cardiac event is 0.9% according to the Revised Cardiac Risk Index (RCRI).  Therefore, she is at low risk for perioperative complications.   Her functional capacity is good at 6.05 METs according to the [employer] Activity Status Index (DASI). Recommendations: According to ACC/AHA guidelines, no further cardiovascular testing needed.  The patient may proceed to  surgery at acceptable risk.     A copy of this note will be routed to requesting surgeon.  Time:   Today, I have spent 17 minutes with the patient with telehealth technology discussing medical history, symptoms, and management plan.     Sharlene Dory, PA-C  06/17/2022, 4:16 PM

## 2022-06-17 NOTE — Telephone Encounter (Signed)
I s/w the pt and informed her per the pre op provider to add on to pre op schedule for today. Pt agreeable to plan of care. Pt added on 06/17/22 @ 3:40. Med rec and consent are done.      Patient Consent for Virtual Visit        TOBEY LIPPARD has provided verbal consent on 06/17/2022 for a virtual visit (video or telephone).   CONSENT FOR VIRTUAL VISIT FOR:  Marlise Eves  By participating in this virtual visit I agree to the following:  I hereby voluntarily request, consent and authorize [facility] and its employed or contracted physicians, physician assistants, nurse practitioners or other licensed health care professionals (the Practitioner), to provide me with telemedicine health care services (the "Services") as deemed necessary by the treating Practitioner. I acknowledge and consent to receive the Services by the Practitioner via telemedicine. I understand that the telemedicine visit will involve communicating with the Practitioner through live audiovisual communication technology and the disclosure of certain medical information by electronic transmission. I acknowledge that I have been given the opportunity to request an in-person assessment or other available alternative prior to the telemedicine visit and am voluntarily participating in the telemedicine visit.  I understand that I have the right to withhold or withdraw my consent to the use of telemedicine in the course of my care at any time, without affecting my right to future care or treatment, and that the Practitioner or I may terminate the telemedicine visit at any time. I understand that I have the right to inspect all information obtained and/or recorded in the course of the telemedicine visit and may receive copies of available information for a reasonable fee.  I understand that some of the potential risks of receiving the Services via telemedicine include:  Delay or interruption in medical evaluation due to  technological equipment failure or disruption; Information transmitted may not be sufficient (e.g. poor resolution of images) to allow for appropriate medical decision making by the Practitioner; and/or  In rare instances, security protocols could fail, causing a breach of personal health information.  Furthermore, I acknowledge that it is my responsibility to provide information about my medical history, conditions and care that is complete and accurate to the best of my ability. I acknowledge that Practitioner's advice, recommendations, and/or decision may be based on factors not within their control, such as incomplete or inaccurate data provided by me or distortions of diagnostic images or specimens that may result from electronic transmissions. I understand that the practice of medicine is not an exact science and that Practitioner makes no warranties or guarantees regarding treatment outcomes. I acknowledge that a copy of this consent can be made available to me via my patient portal [facility] MyChart), or I can request a printed copy by calling the office of [facility].    I understand that my insurance will be billed for this visit.   I have read or had this consent read to me. I understand the contents of this consent, which adequately explains the benefits and risks of the Services being provided via telemedicine.  I have been provided ample opportunity to ask questions regarding this consent and the Services and have had my questions answered to my satisfaction. I give my informed consent for the services to be provided through the use of telemedicine in my medical care

## 2022-06-24 ENCOUNTER — Ambulatory Visit (INDEPENDENT_AMBULATORY_CARE_PROVIDER_SITE_OTHER): Payer: 59

## 2022-06-24 DIAGNOSIS — Z95810 Presence of automatic (implantable) cardiac defibrillator: Secondary | ICD-10-CM | POA: Diagnosis not present

## 2022-06-24 DIAGNOSIS — I469 Cardiac arrest, cause unspecified: Secondary | ICD-10-CM

## 2022-06-25 LAB — CUP PACEART REMOTE DEVICE CHECK
Battery Remaining Longevity: 15 mo
Battery Voltage: 2.88 V
Brady Statistic AP VP Percent: 0.03 %
Brady Statistic AP VS Percent: 90.83 %
Brady Statistic AS VP Percent: 0.01 %
Brady Statistic AS VS Percent: 9.13 %
Brady Statistic RA Percent Paced: 90.79 %
Brady Statistic RV Percent Paced: 0.05 %
Date Time Interrogation Session: 20231121123426
HighPow Impedance: 57 Ohm
HighPow Impedance: 76 Ohm
Implantable Lead Connection Status: 753985
Implantable Lead Connection Status: 753985
Implantable Lead Implant Date: 20090605
Implantable Lead Implant Date: 20150417
Implantable Lead Location: 753859
Implantable Lead Location: 753860
Implantable Lead Model: 5076
Implantable Lead Model: 7120
Implantable Pulse Generator Implant Date: 20150417
Lead Channel Impedance Value: 361 Ohm
Lead Channel Impedance Value: 418 Ohm
Lead Channel Impedance Value: 513 Ohm
Lead Channel Pacing Threshold Amplitude: 0.5 V
Lead Channel Pacing Threshold Amplitude: 0.75 V
Lead Channel Pacing Threshold Pulse Width: 0.4 ms
Lead Channel Pacing Threshold Pulse Width: 0.4 ms
Lead Channel Sensing Intrinsic Amplitude: 2.5 mV
Lead Channel Sensing Intrinsic Amplitude: 2.5 mV
Lead Channel Sensing Intrinsic Amplitude: 3.875 mV
Lead Channel Sensing Intrinsic Amplitude: 3.875 mV
Lead Channel Setting Pacing Amplitude: 2 V
Lead Channel Setting Pacing Amplitude: 2.5 V
Lead Channel Setting Pacing Pulse Width: 0.4 ms
Lead Channel Setting Sensing Sensitivity: 0.3 mV

## 2022-07-15 ENCOUNTER — Other Ambulatory Visit: Payer: Self-pay | Admitting: Internal Medicine

## 2022-07-18 NOTE — Progress Notes (Signed)
Remote ICD transmission.   

## 2022-09-23 ENCOUNTER — Ambulatory Visit (INDEPENDENT_AMBULATORY_CARE_PROVIDER_SITE_OTHER): Payer: 59

## 2022-09-23 DIAGNOSIS — I469 Cardiac arrest, cause unspecified: Secondary | ICD-10-CM | POA: Diagnosis not present

## 2022-09-25 LAB — CUP PACEART REMOTE DEVICE CHECK
Battery Remaining Longevity: 12 mo
Battery Voltage: 2.87 V
Brady Statistic AP VP Percent: 0.03 %
Brady Statistic AP VS Percent: 82.38 %
Brady Statistic AS VP Percent: 0.04 %
Brady Statistic AS VS Percent: 17.55 %
Brady Statistic RA Percent Paced: 82.08 %
Brady Statistic RV Percent Paced: 0.07 %
Date Time Interrogation Session: 20240222035008
HighPow Impedance: 52 Ohm
HighPow Impedance: 70 Ohm
Implantable Lead Connection Status: 753985
Implantable Lead Connection Status: 753985
Implantable Lead Implant Date: 20090605
Implantable Lead Implant Date: 20150417
Implantable Lead Location: 753859
Implantable Lead Location: 753860
Implantable Lead Model: 5076
Implantable Lead Model: 7120
Implantable Pulse Generator Implant Date: 20150417
Lead Channel Impedance Value: 304 Ohm
Lead Channel Impedance Value: 399 Ohm
Lead Channel Impedance Value: 418 Ohm
Lead Channel Pacing Threshold Amplitude: 0.5 V
Lead Channel Pacing Threshold Amplitude: 0.75 V
Lead Channel Pacing Threshold Pulse Width: 0.4 ms
Lead Channel Pacing Threshold Pulse Width: 0.4 ms
Lead Channel Sensing Intrinsic Amplitude: 2.375 mV
Lead Channel Sensing Intrinsic Amplitude: 2.375 mV
Lead Channel Sensing Intrinsic Amplitude: 3.75 mV
Lead Channel Sensing Intrinsic Amplitude: 3.75 mV
Lead Channel Setting Pacing Amplitude: 2 V
Lead Channel Setting Pacing Amplitude: 2.5 V
Lead Channel Setting Pacing Pulse Width: 0.4 ms
Lead Channel Setting Sensing Sensitivity: 0.3 mV

## 2022-10-23 ENCOUNTER — Telehealth: Payer: Self-pay

## 2022-10-23 NOTE — Telephone Encounter (Signed)
Erroneous encounter

## 2022-10-23 NOTE — Telephone Encounter (Signed)
Patient called in this morning letting us know she has sent in a transmission and wants to know if anything was seen on it specifically afib. Patient would like a call back for reassurance

## 2022-10-23 NOTE — Progress Notes (Signed)
Remote ICD transmission.   

## 2022-10-23 NOTE — Telephone Encounter (Signed)
Patient sent a manual transmission to determine if she is in AF.  She did begin to feel some fast heart beats and fatigue yesterday evening and some chest tightness only when waking up this am.  She has started back on her Eliquis and is taking an extra Metoprolol as she had been instructed by her provider to do.  Current transmission reveals:  Ongoing Afib/flutter  Presenting shows AF with ventricular rates 100-110bpm.  (Note patient is taking an extra Metoprolol at this time).  Episode by device flagged as VT/VF treated with ATP, appears AF with RVR and not true VT.  ATP was not effective for the AF but it seemed to exacerbate the ventricular rhythm.  ATP in the ventricle did slow down the ventricular rate.   Will review the episode with Dr. Lovena Le to confirm that this was not a true VT event.  And also consider whether atrial ATP therapy is appropriate given that it doesn't work and may have caused escalation of the fast heart rhythm. Patient is waiting any further recommendations, she will send [place] an updated transmission on Monday.    In meantime, patient has ER precautions if symptoms progress.  She has our device number for further concerns.  She will continue to take Eliquis bid and extra Metoprolol prn.   TODAY'S PRESENTING:    EPISODE: 3pm yesterday (10/22/19)

## 2022-10-27 NOTE — Telephone Encounter (Signed)
Returned call to Pt.  Advised per Dr. Lovena Le: [name] to drive Would NOT change atrial therapies yet, unless happens again Agrees with appt with Renee on Wednesday if she is in town If still lives in [place] needs to follow up with someone there  Per Pt she is currently in [place]. She states she will go to the hospital for a cardioversion.  She states she is still out of rhythm and feels tired, can feel her heart racing.  She states she sent a transmission this morning, but it is not in [facility].  She was going to resend.  Gave fax # to device clinic if hospital in [place] needs any information.    Pt thanked nurse for call back.

## 2022-10-27 NOTE — Telephone Encounter (Signed)
Pt LMOVM returning nurse call. She states she did send a manual transmission. Her phone number is [phone].

## 2022-10-27 NOTE — Telephone Encounter (Signed)
Left message requesting call back.  Will get manual transmission to reevaluate rhythm.

## 2022-10-29 ENCOUNTER — Ambulatory Visit: Payer: 59 | Admitting: Physician Assistant

## 2022-12-23 ENCOUNTER — Ambulatory Visit (INDEPENDENT_AMBULATORY_CARE_PROVIDER_SITE_OTHER): Payer: 59

## 2022-12-23 DIAGNOSIS — I469 Cardiac arrest, cause unspecified: Secondary | ICD-10-CM

## 2022-12-23 LAB — CUP PACEART REMOTE DEVICE CHECK
Battery Remaining Longevity: 8 mo
Battery Voltage: 2.86 V
Brady Statistic AP VP Percent: 0.03 %
Brady Statistic AP VS Percent: 90.41 %
Brady Statistic AS VP Percent: 0.01 %
Brady Statistic AS VS Percent: 9.54 %
Brady Statistic RA Percent Paced: 90.32 %
Brady Statistic RV Percent Paced: 0.05 %
Date Time Interrogation Session: 20240521061707
HighPow Impedance: 49 Ohm
HighPow Impedance: 66 Ohm
Implantable Lead Connection Status: 753985
Implantable Lead Connection Status: 753985
Implantable Lead Implant Date: 20090605
Implantable Lead Implant Date: 20150417
Implantable Lead Location: 753859
Implantable Lead Location: 753860
Implantable Lead Model: 5076
Implantable Lead Model: 7120
Implantable Pulse Generator Implant Date: 20150417
Lead Channel Impedance Value: 304 Ohm
Lead Channel Impedance Value: 418 Ohm
Lead Channel Impedance Value: 418 Ohm
Lead Channel Pacing Threshold Amplitude: 0.5 V
Lead Channel Pacing Threshold Amplitude: 0.75 V
Lead Channel Pacing Threshold Pulse Width: 0.4 ms
Lead Channel Pacing Threshold Pulse Width: 0.4 ms
Lead Channel Sensing Intrinsic Amplitude: 2.25 mV
Lead Channel Sensing Intrinsic Amplitude: 2.25 mV
Lead Channel Sensing Intrinsic Amplitude: 3.75 mV
Lead Channel Sensing Intrinsic Amplitude: 3.75 mV
Lead Channel Setting Pacing Amplitude: 2 V
Lead Channel Setting Pacing Amplitude: 2.5 V
Lead Channel Setting Pacing Pulse Width: 0.4 ms
Lead Channel Setting Sensing Sensitivity: 0.3 mV

## 2023-01-15 NOTE — Progress Notes (Signed)
Remote ICD transmission.   

## 2023-02-25 ENCOUNTER — Telehealth: Payer: Self-pay | Admitting: Internal Medicine

## 2023-02-25 ENCOUNTER — Telehealth: Payer: Self-pay

## 2023-02-25 NOTE — Telephone Encounter (Signed)
I released the patient in [facility] to the [employer] in [place]. I canceled all upcoming remotes.  I called [employer] but they are closed for the day. There phone number is [phone].

## 2023-02-25 NOTE — Telephone Encounter (Signed)
Spoke to patient to see if there was anything she needed from [place].  She said she may be back but felt it was in her best interest to find doctor closer to her.  She was appreciative of my call.

## 2023-02-25 NOTE — Telephone Encounter (Signed)
Patient stated she had moved to [place] and is now transferring care to new cardiology provider.

## 2023-02-27 NOTE — Telephone Encounter (Signed)
I tried to call [facility] but the phone keep hanging up on me.

## 2023-03-05 NOTE — Telephone Encounter (Signed)
Pt has been transferred.

## 2023-03-05 NOTE — Telephone Encounter (Signed)
The clinic took a message to give to the device clinic to enroll the patient into their [facility] system.

## 2023-04-08 ENCOUNTER — Other Ambulatory Visit: Payer: Self-pay | Admitting: Internal Medicine

## 2023-07-03 ENCOUNTER — Other Ambulatory Visit: Payer: Self-pay | Admitting: Internal Medicine

## 2023-07-07 ENCOUNTER — Other Ambulatory Visit: Payer: Self-pay

## 2023-07-13 MED ORDER — MEXILETINE HCL 150 MG PO CAPS: 150.0000 mg | ORAL_CAPSULE | Freq: Two times a day (BID) | ORAL | 3 refills | Status: AC

## 2023-07-25 ENCOUNTER — Other Ambulatory Visit: Payer: Self-pay | Admitting: Internal Medicine

## 2024-01-20 ENCOUNTER — Other Ambulatory Visit: Payer: Self-pay | Admitting: Internal Medicine
